# Patient Record
Sex: Female | Born: 1935
Health system: Southern US, Community
[De-identification: ages and names within clinical notes are randomized; demographics above are authoritative.]

## PROBLEM LIST (undated history)

## (undated) DIAGNOSIS — IMO0001 Reserved for inherently not codable concepts without codable children: Secondary | ICD-10-CM

## (undated) DIAGNOSIS — R208 Other disturbances of skin sensation: Secondary | ICD-10-CM

## (undated) DIAGNOSIS — I080 Rheumatic disorders of both mitral and aortic valves: Secondary | ICD-10-CM

## (undated) DIAGNOSIS — J349 Unspecified disorder of nose and nasal sinuses: Secondary | ICD-10-CM

## (undated) DIAGNOSIS — C50919 Malignant neoplasm of unspecified site of unspecified female breast: Secondary | ICD-10-CM

## (undated) DIAGNOSIS — E7800 Pure hypercholesterolemia, unspecified: Secondary | ICD-10-CM

## (undated) DIAGNOSIS — R269 Unspecified abnormalities of gait and mobility: Secondary | ICD-10-CM

## (undated) DIAGNOSIS — R0602 Shortness of breath: Secondary | ICD-10-CM

## (undated) DIAGNOSIS — F419 Anxiety disorder, unspecified: Secondary | ICD-10-CM

## (undated) DIAGNOSIS — Z9289 Personal history of other medical treatment: Secondary | ICD-10-CM

## (undated) DIAGNOSIS — E785 Hyperlipidemia, unspecified: Secondary | ICD-10-CM

## (undated) DIAGNOSIS — R011 Cardiac murmur, unspecified: Secondary | ICD-10-CM

## (undated) DIAGNOSIS — G479 Sleep disorder, unspecified: Secondary | ICD-10-CM

## (undated) DIAGNOSIS — K5790 Diverticulosis of intestine, part unspecified, without perforation or abscess without bleeding: Secondary | ICD-10-CM

## (undated) DIAGNOSIS — M8580 Other specified disorders of bone density and structure, unspecified site: Secondary | ICD-10-CM

## (undated) DIAGNOSIS — H9190 Unspecified hearing loss, unspecified ear: Secondary | ICD-10-CM

## (undated) DIAGNOSIS — I10 Essential (primary) hypertension: Secondary | ICD-10-CM

## (undated) DIAGNOSIS — M1990 Unspecified osteoarthritis, unspecified site: Secondary | ICD-10-CM

## (undated) DIAGNOSIS — G4700 Insomnia, unspecified: Secondary | ICD-10-CM

## (undated) DIAGNOSIS — I890 Lymphedema, not elsewhere classified: Secondary | ICD-10-CM

## (undated) DIAGNOSIS — M419 Scoliosis, unspecified: Secondary | ICD-10-CM

## (undated) DIAGNOSIS — R4189 Other symptoms and signs involving cognitive functions and awareness: Secondary | ICD-10-CM

## (undated) DIAGNOSIS — K219 Gastro-esophageal reflux disease without esophagitis: Secondary | ICD-10-CM

## (undated) DIAGNOSIS — R609 Edema, unspecified: Secondary | ICD-10-CM

## (undated) DIAGNOSIS — N189 Chronic kidney disease, unspecified: Secondary | ICD-10-CM

## (undated) DIAGNOSIS — Z8619 Personal history of other infectious and parasitic diseases: Secondary | ICD-10-CM

## (undated) HISTORY — DX: Hyperlipidemia, unspecified: E78.5

## (undated) HISTORY — DX: Personal history of other medical treatment: Z92.89

## (undated) HISTORY — DX: Other symptoms and signs involving cognitive functions and awareness: R41.89

## (undated) HISTORY — DX: Gastro-esophageal reflux disease without esophagitis: K21.9

## (undated) HISTORY — DX: Edema, unspecified: R60.9

## (undated) HISTORY — DX: Cardiac murmur, unspecified: R01.1

## (undated) HISTORY — DX: Shortness of breath: R06.02

## (undated) HISTORY — DX: Malignant neoplasm of unspecified site of unspecified female breast: C50.919

## (undated) HISTORY — DX: Other specified disorders of bone density and structure, unspecified site: M85.80

## (undated) HISTORY — DX: Unspecified osteoarthritis, unspecified site: M19.90

## (undated) HISTORY — DX: Pure hypercholesterolemia, unspecified: E78.00

## (undated) HISTORY — DX: Essential (primary) hypertension: I10

## (undated) HISTORY — PX: EYE SURGERY: SHX253

## (undated) HISTORY — DX: Anxiety disorder, unspecified: F41.9

## (undated) HISTORY — DX: Insomnia, unspecified: G47.00

## (undated) HISTORY — DX: Rheumatic disorders of both mitral and aortic valves: I08.0

## (undated) HISTORY — DX: Chronic kidney disease, unspecified: N18.9

## (undated) HISTORY — DX: Unspecified abnormalities of gait and mobility: R26.9

## (undated) HISTORY — DX: Other disturbances of skin sensation: R20.8

## (undated) HISTORY — DX: Scoliosis, unspecified: M41.9

---
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
## 1971-03-03 HISTORY — PX: ABDOMINAL HYSTERECTOMY: SHX81

## 1994-05-01 HISTORY — PX: TRANSTHORACIC ECHOCARDIOGRAM: SHX275

## 1995-03-03 DIAGNOSIS — C50919 Malignant neoplasm of unspecified site of unspecified female breast: Secondary | ICD-10-CM

## 1995-03-03 HISTORY — DX: Malignant neoplasm of unspecified site of unspecified female breast: C50.919

## 1995-03-03 HISTORY — PX: BREAST LUMPECTOMY: SHX2

## 1996-03-02 DIAGNOSIS — Z8619 Personal history of other infectious and parasitic diseases: Secondary | ICD-10-CM

## 1996-03-02 HISTORY — DX: Personal history of other infectious and parasitic diseases: Z86.19

## 1997-04-10 ENCOUNTER — Ambulatory Visit (HOSPITAL_COMMUNITY): Admission: RE | Admit: 1997-04-10 | Discharge: 1997-04-10 | Payer: Self-pay | Admitting: Cardiology

## 1997-10-02 ENCOUNTER — Ambulatory Visit (HOSPITAL_COMMUNITY): Admission: RE | Admit: 1997-10-02 | Discharge: 1997-10-02 | Payer: Self-pay | Admitting: *Deleted

## 1998-09-16 ENCOUNTER — Other Ambulatory Visit: Admission: RE | Admit: 1998-09-16 | Discharge: 1998-09-16 | Payer: Self-pay | Admitting: Family Medicine

## 1998-10-04 ENCOUNTER — Ambulatory Visit (HOSPITAL_COMMUNITY): Admission: RE | Admit: 1998-10-04 | Discharge: 1998-10-04 | Payer: Self-pay | Admitting: *Deleted

## 1998-11-28 ENCOUNTER — Ambulatory Visit (HOSPITAL_COMMUNITY): Admission: RE | Admit: 1998-11-28 | Discharge: 1998-11-28 | Payer: Self-pay | Admitting: *Deleted

## 1999-10-10 ENCOUNTER — Encounter: Payer: Self-pay | Admitting: Specialist

## 1999-10-14 ENCOUNTER — Encounter: Payer: Self-pay | Admitting: Oncology

## 1999-10-14 ENCOUNTER — Encounter: Admission: RE | Admit: 1999-10-14 | Discharge: 1999-10-14 | Payer: Self-pay

## 1999-10-16 ENCOUNTER — Ambulatory Visit (HOSPITAL_COMMUNITY): Admission: RE | Admit: 1999-10-16 | Discharge: 1999-10-16 | Payer: Self-pay | Admitting: Specialist

## 2000-02-04 ENCOUNTER — Other Ambulatory Visit: Admission: RE | Admit: 2000-02-04 | Discharge: 2000-02-04 | Payer: Self-pay | Admitting: Family Medicine

## 2000-03-02 HISTORY — PX: REPLACEMENT TOTAL KNEE: SUR1224

## 2000-10-14 ENCOUNTER — Encounter: Admission: RE | Admit: 2000-10-14 | Discharge: 2000-10-14 | Payer: Self-pay | Admitting: Oncology

## 2000-10-14 ENCOUNTER — Encounter: Payer: Self-pay | Admitting: Oncology

## 2001-10-18 ENCOUNTER — Encounter: Payer: Self-pay | Admitting: Oncology

## 2001-10-18 ENCOUNTER — Encounter: Admission: RE | Admit: 2001-10-18 | Discharge: 2001-10-18 | Payer: Self-pay | Admitting: Oncology

## 2002-03-24 ENCOUNTER — Inpatient Hospital Stay (HOSPITAL_COMMUNITY): Admission: EM | Admit: 2002-03-24 | Discharge: 2002-03-27 | Payer: Self-pay | Admitting: Family Medicine

## 2002-03-24 ENCOUNTER — Encounter: Payer: Self-pay | Admitting: Internal Medicine

## 2002-03-25 ENCOUNTER — Encounter: Payer: Self-pay | Admitting: Internal Medicine

## 2002-10-23 ENCOUNTER — Encounter: Admission: RE | Admit: 2002-10-23 | Discharge: 2002-10-23 | Payer: Self-pay | Admitting: Family Medicine

## 2002-10-23 ENCOUNTER — Encounter: Payer: Self-pay | Admitting: Family Medicine

## 2003-11-02 ENCOUNTER — Encounter: Admission: RE | Admit: 2003-11-02 | Discharge: 2003-11-02 | Payer: Self-pay | Admitting: Family Medicine

## 2005-01-13 ENCOUNTER — Encounter: Admission: RE | Admit: 2005-01-13 | Discharge: 2005-01-13 | Payer: Self-pay | Admitting: Family Medicine

## 2006-03-15 ENCOUNTER — Encounter: Admission: RE | Admit: 2006-03-15 | Discharge: 2006-03-15 | Payer: Self-pay | Admitting: Family Medicine

## 2007-04-04 ENCOUNTER — Encounter: Admission: RE | Admit: 2007-04-04 | Discharge: 2007-04-04 | Payer: Self-pay | Admitting: Family Medicine

## 2008-04-10 ENCOUNTER — Encounter: Admission: RE | Admit: 2008-04-10 | Discharge: 2008-04-10 | Payer: Self-pay | Admitting: Family Medicine

## 2009-03-27 ENCOUNTER — Emergency Department (HOSPITAL_COMMUNITY): Admission: EM | Admit: 2009-03-27 | Discharge: 2009-03-27 | Payer: Self-pay | Admitting: Emergency Medicine

## 2009-05-22 ENCOUNTER — Encounter: Admission: RE | Admit: 2009-05-22 | Discharge: 2009-05-22 | Payer: Self-pay | Admitting: Family Medicine

## 2009-10-17 ENCOUNTER — Emergency Department (HOSPITAL_COMMUNITY): Admission: EM | Admit: 2009-10-17 | Discharge: 2009-10-17 | Payer: Self-pay | Admitting: Emergency Medicine

## 2009-11-28 ENCOUNTER — Encounter: Admission: RE | Admit: 2009-11-28 | Discharge: 2009-11-28 | Payer: Self-pay | Admitting: Orthopedic Surgery

## 2009-11-30 DIAGNOSIS — Z9289 Personal history of other medical treatment: Secondary | ICD-10-CM

## 2009-11-30 HISTORY — DX: Personal history of other medical treatment: Z92.89

## 2009-12-31 HISTORY — PX: ROTATOR CUFF REPAIR: SHX139

## 2010-01-24 ENCOUNTER — Ambulatory Visit (HOSPITAL_COMMUNITY)
Admission: RE | Admit: 2010-01-24 | Discharge: 2010-01-25 | Payer: Self-pay | Source: Home / Self Care | Admitting: Orthopedic Surgery

## 2010-03-23 ENCOUNTER — Encounter: Payer: Self-pay | Admitting: Family Medicine

## 2010-05-13 LAB — BASIC METABOLIC PANEL
CO2: 31 mEq/L (ref 19–32)
GFR calc non Af Amer: 53 mL/min — ABNORMAL LOW (ref >60)
Glucose, Bld: 105 mg/dL — ABNORMAL HIGH (ref 70–99)
Potassium: 4.5 mEq/L (ref 3.5–5.1)
Sodium: 145 mEq/L (ref 135–145)

## 2010-05-13 LAB — CBC
MCH: 31.3 pg (ref 26.0–34.0)
MCHC: 34.8 g/dL (ref 30.0–36.0)
RDW: 13.1 % (ref 11.5–15.5)

## 2010-05-13 LAB — SURGICAL PCR SCREEN: MRSA, PCR: NEGATIVE

## 2010-05-19 LAB — URINALYSIS, ROUTINE W REFLEX MICROSCOPIC
Glucose, UA: NEGATIVE mg/dL
Nitrite: NEGATIVE
Specific Gravity, Urine: 1.013 (ref 1.005–1.030)
pH: 6 (ref 5.0–8.0)

## 2010-05-19 LAB — CULTURE, BLOOD (ROUTINE X 2): Culture: NO GROWTH

## 2010-05-19 LAB — CBC
MCV: 88.8 fL (ref 78.0–100.0)
Platelets: 150 10*3/uL (ref 150–400)
RDW: 13.1 % (ref 11.5–15.5)
WBC: 3.4 10*3/uL — ABNORMAL LOW (ref 4.0–10.5)

## 2010-05-19 LAB — COMPREHENSIVE METABOLIC PANEL
ALT: 20 U/L (ref 0–35)
Alkaline Phosphatase: 42 U/L (ref 39–117)
CO2: 28 mEq/L (ref 19–32)
Chloride: 93 mEq/L — ABNORMAL LOW (ref 96–112)
GFR calc Af Amer: >60 mL/min (ref >60)
GFR calc non Af Amer: >60 mL/min (ref >60)
Glucose, Bld: 113 mg/dL — ABNORMAL HIGH (ref 70–99)
Potassium: 3.4 mEq/L — ABNORMAL LOW (ref 3.5–5.1)
Sodium: 130 mEq/L — ABNORMAL LOW (ref 135–145)

## 2010-05-19 LAB — DIFFERENTIAL
Basophils Absolute: 0 10*3/uL (ref 0.0–0.1)
Eosinophils Absolute: 0.1 10*3/uL (ref 0.0–0.7)
Eosinophils Relative: 3 % (ref 0–5)
Monocytes Absolute: 0.4 10*3/uL (ref 0.1–1.0)

## 2010-05-19 LAB — CK TOTAL AND CKMB (NOT AT ARMC): CK, MB: 0.6 ng/mL (ref 0.3–4.0)

## 2010-05-19 LAB — SEDIMENTATION RATE: Sed Rate: 30 mm/hr — ABNORMAL HIGH (ref 0–22)

## 2010-07-18 NOTE — Discharge Summary (Signed)
[name], [name] NO.:  [id number]   MEDICAL RECORD NO.:  [id number]                   PATIENT TYPE:  INP   LOCATION:  0380                                 FACILITY:  [facility]   PHYSICIAN:  Lazaro Arms, M.D.        DATE OF BIRTH:  10-07-1926   DATE OF ADMISSION:  03/24/2002  DATE OF DISCHARGE:  03/27/2002                                 DISCHARGE SUMMARY   DISCHARGE DIAGNOSES:  1. Anorexia and weight loss of unclear etiology.  2. Gastroesophageal reflux disease.  3. Hypertension.  4. Chronic anxiety.  5. Hyperlipidemia.  6. Status post breast cancer.  7. Allergic rhinitis.   PROCEDURE:  Upper endoscopy by Dr. Bernette Redbird on March 26, 2002, which  was a completely normal examination. Normal upper endoscopy and normal  duodenoscopy.   DIAGNOSTIC IMPRESSION:  An ultrasound of the abdomen showed a normal  gallbladder without any gallstones and a slightly dilated common bile duct  at 6.7 mm.  A CT of the abdomen raised the question of common bile duct that  was approximately 11 mm and some diverticulosis. A chest x-ray was within  normal limits.   HISTORY OF PRESENT ILLNESS:  Ms. [name] Morton was directly admitted from the  office because of anorexia and weight loss and fatigue, which had been  progressive over the past several weeks. Prior to this, she had had some  orthodontic work, as well as a sinus infection and had taken some  antibiotics for that. Workup as an outpatient was unrevealing. She was  admitted to the hospital and her initial labs were significant for serum  sodium of 131, potassium of 3.1. The chief complaint was just a lack of  appetite that she could not explain and she was quite worried about this.  She was admitted to the hospital and placed on IV fluids. GI was consulted.  She was seen by Dr. Bernette Redbird on March 26, 2002. See his consultation  note for full details. The patient was placed on dietary  supplements and she  did quite well. Of note, her blood count did drop slightly with hydration.   DISPOSITION:  The patient was discharged to home on March 26, 2002.   DISCHARGE LABORATORY DATA:  Included hematocrit of 30.4 and serum sodium of  133.   PLAN:  1. She will follow-up with Shaune Pollack, M.D. each week for weight checks,     follow-up CBC's and basic metabolic profile.  2.     She will follow-up with Dr. Matthias Hughs as needed if she has worsening abdominal     pain.  3. She is planning on removing her braces, as the dysacusia associated with     the braces, might be contributing to her anorexia. She has an appointment     with her orthodontist for such.  Lazaro Arms, M.D.    AMC/MEDQ  D:  03/27/2002  T:  03/27/2002  Job:  [id number]   cc:   Duncan Dull, M.D.  [address]  [place]  [place] [address]  Fax: [phone]

## 2010-07-18 NOTE — Consult Note (Signed)
NAME:  [name] Morton, [name] Morton NO.:  [id number]   MEDICAL RECORD NO.:  [id number]                   PATIENT TYPE:  INP   LOCATION:  0380                                 FACILITY:  [facility]   PHYSICIAN:  Bernette Redbird, M.D.                DATE OF BIRTH:  [birth year]   DATE OF CONSULTATION:  03/25/2002  DATE OF DISCHARGE:                                   CONSULTATION   Dr. Maryruth Hancock Campolattaro of the [facility] Service asked me to see  this 77 year old female because of an enlarged bile duct on CT scanning.   HISTORY OF PRESENT ILLNESS:  The patient was admitted as a direct admit  yesterday from [facility] because of more-or-less a  failure to thrive situation.  The story seems to go back several weeks,  after doing some heavy yard work, and the next day she found herself so  exhausted that she basically could not get out of bed.  Since then, and  especially over the past several weeks since she had some major orthodontic  work done, the patient has had a diminished appetite.  This seems to be  mixed in with the fact that the patient has pain every time she tries to  take a bite of food, which has led to some degree of food avoidance  behavior.  Dr. Jonell Cluck obtained a history of abdominal pain, but  despite fairly direct questioning, that does not seem to be a problem for  the patient from the history I obtained.  It certainly is not a prominent  component or feature of her symptoms.   The patient believes she has lost about 10 pounds in association with this  condition.  She has had an empty feeling in her stomach and this has been  associated with some de anorexia.  Again, frank abdominal pain is not a  symptom that I obtained a history of.   The patient was admitted yesterday for further evaluation.  Admission labs  were unremarkable other than some mile hyponatremia with a serum sodium of  130.  Notably, liver chemistries  were entirely within normal limits and  amylase and lipase were also normal.  TSH level was normal.  Fecal occult  blood was negative.   The patient had a CT scan of the abdomen which I have reviewed at length  with the radiologist.  The only abnormality there is a somewhat dilated  common bile duct, all the way down to the level of the ampulla, reaching a  diameter of about 1 cm, without any dilatation of the pancreatic duct and  without any evidence of a pancreatic mass.   Notably, there is a family history of gallstones in the patient's mother.   PAST MEDICAL HISTORY:   ALLERGIES:  Allergies or intolerance to various medications including  BENADRYL (makes her break out), HUMIBID, and CEFTIN.   OUTPATIENT MEDICATIONS:  1.  Prilosec.  2. Furosemide.  3. Evista.  4. Allegra.  5. Lotensin.  6. Hydrochlorothiazide.  7. Blood pressure pill with bisoprolol and hydrochlorothiazide.  8. Zocor.  9. Daily aspirin.   OPERATIONS:  1. Lumpectomy with radiation therapy for breast cancer in 1997.  2. Remote appendectomy.  3. Hysterectomy.   CHRONIC MEDICAL ILLNESSES:  1. Hypertension.  2. History of mitral valve prolapse.  3. No COPD.  4. No diabetes.  5. No known coronary disease.   HABITS:  Lifelong nonsmoker, nondrinker.   FAMILY HISTORY:  Positive for gallstones in her mother, but negative for  colon cancer or colitis.  There is also a family history of ulcer disease in  her father.   SOCIAL HISTORY:  The patient is a retired Engineer, civil (consulting) who is married for the  second time after being widowed for about 15 years.   REVIEW OF SYSTEMS:  Negative for heartburn, dysphagia, significant abdominal  pain (by my history), constipation, diarrhea, or rectal bleeding.  The  patient typically moves her bowels every 1-2 days (although she has had some  diarrhea in the hospital since drinking contrast for the CT scan).   PHYSICAL EXAMINATION:  GENERAL:  The patient is a very healthy-appearing   female in absolutely no distress and appearing neither anxious nor  depressed.  She has makeup on, her hair is done, she is slightly overweight,  and looks very healthy overall.  She does not have in any way a cachectic or  chronically ill appearance.  VITAL SIGNS:  Blood pressure 144/62, pulse 76, afebrile, respirations 19.  SKIN:  She is without evident pallor.  CHEST:  Clear to auscultation.  HEART:  There is a 1/6 systolic ejection murmur.  ABDOMEN:  The abdomen is adipose without organomegaly, guarding, masses, or  tenderness.  The abdomen is very soft and again totally benign.  RECTAL:  Rectal exam was performed and showed an empty ampulla with no  masses.  Previously, a stool specimen had already been sent, which was  Hemoccult negative, so I did not re-guaiac the mucoid specimen obtained on  the exam glove.   LABORATORY DATA:  White count 9000, hemoglobin 13.3, MCV 88, platelets  369,000.  Sodium 130, potassium 3.5, chloride 93, CO2 28, glucose 126  (nonfasting, with 87 on repeat as a fasting specimen).  BUN 10, creatinine  1.0, bilirubin 0.8, alkaline phosphatase 42, SGOT 15, SGPT 14.  Albumin 4.1,  calcium 9.6.  TSH 1.6, amylase 67, lipase 21.   CT scan:  See above.   IMPRESSION:  1. Dilation of the common bile duct by CT scanning, in the absence of any     biliary tract symptoms or LFT abnormalities.  This may be some form of     physiologic dilatation of the common duct, or it could result from stones     in the common duct, ampullary stenosis, and ampullary tumor,     cholangiocarcinoma, or cancer of the head of the pancreas.  It would be     somewhat unusual for something causing chronic obstruction, such as a     tumor, to cath entirely normal liver chemistries.  2. Anorexia/food avoidance/weight loss/empty feeling in the stomach,     possibly secondary to recent orthodontic work, conceivably a medication    reaction, or possibly due to some sort of underlying disease,  which seems     unlikely.   RECOMMENDATIONS:  1. Await results of the ultrasound.  2. Check CA 19/9 level.  3. Upper endoscopy tomorrow morning (I have discussed the nature, purpose,     and risks of the test with the patient and she is agreeable to proceed).  4. Consider MRCP for further evaluation of the common duct.  At this time, I     do not think that there is sufficient clinical suspicion to warrant doing     ERCP and exposing the patient to the risks associated with that     procedure.  5. The patient's colon cancer screening is probably up to date.  She     describes some sort of a procedure, possibly a colonoscopy of flexible     sigmoidoscopy, several years ago, at which polyps were removed,     although on e chart I see no evidence of a pathology specimen on this     patient having been obtained within the past couple of years.  The     screening for colon cancer could be at the discretion of the patient's     primary physician, who is more acquainted with whatever testing has     already been performed.                                                 Bernette Redbird, M.D.    RB/MEDQ  D:  03/25/2002  T:  03/25/2002  Job:  [id number]   cc:   Duncan Dull, M.D.  [address]  [place]  [place] [address]  Fax: [phone]   Thereasa Solo. Little, M.D.  [address]  [place] [address]  Fax: [phone]

## 2010-07-18 NOTE — Op Note (Signed)
[facility]  Patient:    [name] Morton, [name] Morton                       MRN: [id number] Proc. Date: 10/16/99 Adm. Date:  [id number] Disc. Date: [id number] Attending:  Montez Morita, Philips John                           Operative Report  PREOPERATIVE DIAGNOSIS:  Torn medial meniscus of the left knee.  POSTOPERATIVE DIAGNOSIS:  Torn medial meniscus of the left knee.  OPERATION PERFORMED:  Partial medial meniscectomy.  SURGEON:  Philips J. Montez Morita, M.D.  DESCRIPTION OF PROCEDURE:  After suitable general anesthesia, the leg was exsanguinated and an upper thigh tourniquet was inflated to 350 mmHg and placed into a leg holder.  She was then prepped and draped routinely. Arthroscopy was carried out with the usual portals in the usual fashion with the anteromedial portal being done under direct vision.  All pertinent pathology was confined to the medial joint where there a degenerative tear of the posterior horn.  This was trimmed out with a shut slightly angled up bite and a straight Accuflex bite punch forceps and a 3.5 and a 4.2 rotatory meniscotome.  At the end, a nice smooth posterior rim remained.  Marcaine 20 cc 0.50% in the knee at the end, a little Marcaine in each of the stab wounds. Ice compression dressing.  The tourniquet was up around 30 minutes.  Taken to the recovery room in good condition. DD:  10/16/99 TD:  10/18/99 Job: [id number]

## 2010-07-18 NOTE — Op Note (Signed)
[name] Morton, [name] Morton NO.:  [id number]   MEDICAL RECORD NO.:  [id number]                   PATIENT TYPE:  INP   LOCATION:  0380                                 FACILITY:  [facility]   PHYSICIAN:  Bernette Redbird, M.D.                DATE OF BIRTH:  04/14/1926   DATE OF PROCEDURE:  03/26/2002  DATE OF DISCHARGE:                                 OPERATIVE REPORT   PROCEDURE:  Upper endoscopy.   INDICATION:  This is a 77 year old female, status post recent orthodontic  work which has led to considerable oral pain.  Associated with that has also  been loss of appetite and some questionable nonspecific abdominal pain.  An  abdominal CT a couple of days ago was negative except for dilatation of the  common bile duct to a diameter of about 1 cm.  Fortunately, her ultrasound  yesterday showed less impressive dilatation, just 6.7 mm.  Her liver  chemistries are normal.  Nevertheless, there has been loss of appetite and  loss of weight, so endoscopic evaluation was felt to be warranted.   FINDINGS:  Normal exam.  Duodenoscopy shows normal-appearing papillary  region.   DESCRIPTION OF PROCEDURE:  The nature, purpose, and risks of the procedure  had been discussed with the patient who provided written consent.  Sedation  prior to and during the course of the exam totaled fentanyl 75 mcg and  Versed 8 mg IV without arrhythmias or desaturation.   The Olympus video endoscope was passed easily under direct vision.  The  vocal cords looked grossly normal.  The esophagus was readily entered and  had a normal mucosal appearance without evidence of reflux esophagitis,  Barrett's esophagus, varices, infection, or neoplasia.  No ring, stricture,  or hiatal hernia was appreciated.   The stomach contained no residual and had entirely normal mucosa without  evidence of any masses, polyps, gastritis, erosions, ulcers, or masses,  including a careful look at the cardia  region.   The pylorus, duodenal bulb, and second and proximal third portions of the  duodenum looked normal.   These areas were rechecked, including the stomach and duodenum, and the  scope was then removed from the patient.   I then passed a duodenoscope blindly into the esophagus to view the  papillary region.  There was some resistance, so we had to medicate the  patient further to get her to relax more at which time the scope was able to  be passed.  It was advanced into the second duodenum where I identified what  I believe was the major papilla with the typical hood.  It had a  completely normal appearance.  In fact, it was a little bit on the small  side.  There was certainly no evidence of any ampullary tumor at this  region.  I never visualized another papilla (appreciated only the minor  papilla), but I went up and  down the duodenal area a couple of times and  could not see any mass projecting into the lumen.   The scope was then removed from the patient.  She tolerated the procedure  well.  There there were no apparent complications.  No biopsies were  obtained during this procedure or the first part of the exam with the  standard gastroscope.   IMPRESSION:  Entirely normal upper endoscopy without source of anorexia  endoscopically evident.   PLAN:  Follow up clinically.  The patient's husband indicates they plan to  have the orthodontic appliances removed in the near future and if this does  not bring about resolution of her food intolerance and food avoidance  behavior, further evaluation may be warranted.                                               Bernette Redbird, M.D.    RB/MEDQ  D:  03/26/2002  T:  03/26/2002  Job:  [id number]   cc:   Duncan Dull, M.D.  [address]  [place]  [place] [address]  Fax: [phone]

## 2011-04-20 DIAGNOSIS — H1030 Unspecified acute conjunctivitis, unspecified eye: Secondary | ICD-10-CM | POA: Diagnosis not present

## 2011-04-22 DIAGNOSIS — M949 Disorder of cartilage, unspecified: Secondary | ICD-10-CM | POA: Diagnosis not present

## 2011-04-22 DIAGNOSIS — G4700 Insomnia, unspecified: Secondary | ICD-10-CM | POA: Diagnosis not present

## 2011-04-22 DIAGNOSIS — J329 Chronic sinusitis, unspecified: Secondary | ICD-10-CM | POA: Diagnosis not present

## 2011-06-03 DIAGNOSIS — M949 Disorder of cartilage, unspecified: Secondary | ICD-10-CM | POA: Diagnosis not present

## 2011-06-03 DIAGNOSIS — M899 Disorder of bone, unspecified: Secondary | ICD-10-CM | POA: Diagnosis not present

## 2011-06-09 DIAGNOSIS — H25019 Cortical age-related cataract, unspecified eye: Secondary | ICD-10-CM | POA: Diagnosis not present

## 2011-07-22 DIAGNOSIS — H5200 Hypermetropia, unspecified eye: Secondary | ICD-10-CM | POA: Diagnosis not present

## 2011-07-22 DIAGNOSIS — H52209 Unspecified astigmatism, unspecified eye: Secondary | ICD-10-CM | POA: Diagnosis not present

## 2011-07-22 DIAGNOSIS — H259 Unspecified age-related cataract: Secondary | ICD-10-CM | POA: Diagnosis not present

## 2011-07-22 DIAGNOSIS — H43819 Vitreous degeneration, unspecified eye: Secondary | ICD-10-CM | POA: Diagnosis not present

## 2011-07-28 DIAGNOSIS — H5700 Unspecified anomaly of pupillary function: Secondary | ICD-10-CM | POA: Diagnosis not present

## 2011-07-28 DIAGNOSIS — H25019 Cortical age-related cataract, unspecified eye: Secondary | ICD-10-CM | POA: Diagnosis not present

## 2011-07-28 DIAGNOSIS — H2510 Age-related nuclear cataract, unspecified eye: Secondary | ICD-10-CM | POA: Diagnosis not present

## 2011-07-28 DIAGNOSIS — H269 Unspecified cataract: Secondary | ICD-10-CM | POA: Diagnosis not present

## 2011-07-28 DIAGNOSIS — H21569 Pupillary abnormality, unspecified eye: Secondary | ICD-10-CM | POA: Diagnosis not present

## 2011-07-28 DIAGNOSIS — H25049 Posterior subcapsular polar age-related cataract, unspecified eye: Secondary | ICD-10-CM | POA: Diagnosis not present

## 2011-07-28 DIAGNOSIS — H25039 Anterior subcapsular polar age-related cataract, unspecified eye: Secondary | ICD-10-CM | POA: Diagnosis not present

## 2011-08-11 DIAGNOSIS — H269 Unspecified cataract: Secondary | ICD-10-CM | POA: Diagnosis not present

## 2011-08-11 DIAGNOSIS — H25019 Cortical age-related cataract, unspecified eye: Secondary | ICD-10-CM | POA: Diagnosis not present

## 2011-08-11 DIAGNOSIS — H25049 Posterior subcapsular polar age-related cataract, unspecified eye: Secondary | ICD-10-CM | POA: Diagnosis not present

## 2011-08-11 DIAGNOSIS — H2510 Age-related nuclear cataract, unspecified eye: Secondary | ICD-10-CM | POA: Diagnosis not present

## 2011-09-15 DIAGNOSIS — E782 Mixed hyperlipidemia: Secondary | ICD-10-CM | POA: Diagnosis not present

## 2011-09-15 DIAGNOSIS — I10 Essential (primary) hypertension: Secondary | ICD-10-CM | POA: Diagnosis not present

## 2011-09-16 DIAGNOSIS — E782 Mixed hyperlipidemia: Secondary | ICD-10-CM | POA: Diagnosis not present

## 2011-09-16 DIAGNOSIS — Z79899 Other long term (current) drug therapy: Secondary | ICD-10-CM | POA: Diagnosis not present

## 2011-09-16 DIAGNOSIS — R5381 Other malaise: Secondary | ICD-10-CM | POA: Diagnosis not present

## 2011-10-06 DIAGNOSIS — H43819 Vitreous degeneration, unspecified eye: Secondary | ICD-10-CM | POA: Diagnosis not present

## 2011-12-28 DIAGNOSIS — Z23 Encounter for immunization: Secondary | ICD-10-CM | POA: Diagnosis not present

## 2012-02-22 DIAGNOSIS — H6980 Other specified disorders of Eustachian tube, unspecified ear: Secondary | ICD-10-CM | POA: Diagnosis not present

## 2012-03-02 HISTORY — PX: COLONOSCOPY: SHX174

## 2012-03-24 DIAGNOSIS — H5310 Unspecified subjective visual disturbances: Secondary | ICD-10-CM | POA: Diagnosis not present

## 2012-03-24 DIAGNOSIS — H43819 Vitreous degeneration, unspecified eye: Secondary | ICD-10-CM | POA: Diagnosis not present

## 2012-03-24 DIAGNOSIS — Z961 Presence of intraocular lens: Secondary | ICD-10-CM | POA: Diagnosis not present

## 2012-04-05 DIAGNOSIS — I10 Essential (primary) hypertension: Secondary | ICD-10-CM | POA: Diagnosis not present

## 2012-04-05 DIAGNOSIS — E782 Mixed hyperlipidemia: Secondary | ICD-10-CM | POA: Diagnosis not present

## 2012-04-07 DIAGNOSIS — Z79899 Other long term (current) drug therapy: Secondary | ICD-10-CM | POA: Diagnosis not present

## 2012-04-07 DIAGNOSIS — E782 Mixed hyperlipidemia: Secondary | ICD-10-CM | POA: Diagnosis not present

## 2012-05-02 ENCOUNTER — Other Ambulatory Visit: Payer: Self-pay | Admitting: Cardiology

## 2012-05-05 LAB — BASIC METABOLIC PANEL
BUN: 32 — AB (ref 4–21)
Creatinine: 1.5 — AB (ref 0.5–1.1)
Glucose: 144
Potassium: 4.5 (ref 3.4–5.3)
Sodium: 141 (ref 137–147)

## 2012-05-05 LAB — HEPATIC FUNCTION PANEL
ALT: 14 (ref 7–35)
AST: 15 (ref 13–35)
Alkaline Phosphatase: 51 (ref 25–125)
Bilirubin, Total: 0.4

## 2012-05-05 LAB — TSH: TSH: 2.18 (ref 0.41–5.90)

## 2012-05-20 LAB — BASIC METABOLIC PANEL
BUN: 21 (ref 4–21)
Creatinine: 1.1 (ref 0.5–1.1)
GLUCOSE: 115
Potassium: 4.1 (ref 3.4–5.3)
Sodium: 143 (ref 137–147)

## 2012-05-20 LAB — LIPID PANEL
Cholesterol: 191 (ref 0–200)
HDL: 71 — AB (ref 35–70)
LDL CALC: 92
Triglycerides: 144 (ref 40–160)

## 2012-06-07 ENCOUNTER — Ambulatory Visit
Admission: RE | Admit: 2012-06-07 | Discharge: 2012-06-07 | Disposition: A | Discharge status: Home / Self Care | Payer: TRICARE For Life (TFL) | Source: Ambulatory Visit | Attending: Cardiology | Admitting: Cardiology

## 2012-06-07 DIAGNOSIS — Z1231 Encounter for screening mammogram for malignant neoplasm of breast: Secondary | ICD-10-CM | POA: Diagnosis not present

## 2012-08-10 ENCOUNTER — Other Ambulatory Visit: Payer: Self-pay | Admitting: *Deleted

## 2012-08-10 MED ORDER — AMLODIPINE BESY-BENAZEPRIL HCL 10-20 MG PO CAPS: 1.0000 | ORAL_CAPSULE | Freq: Every day | ORAL | Status: DC

## 2012-08-10 NOTE — Telephone Encounter (Signed)
[facility]rel [facility]

## 2012-08-16 ENCOUNTER — Other Ambulatory Visit: Payer: Self-pay | Admitting: *Deleted

## 2012-08-16 MED ORDER — SIMVASTATIN 20 MG PO TABS: 20.0000 mg | ORAL_TABLET | Freq: Every day | ORAL | Status: DC

## 2012-09-23 DIAGNOSIS — M1710 Unilateral primary osteoarthritis, unspecified knee: Secondary | ICD-10-CM | POA: Diagnosis not present

## 2012-11-15 DIAGNOSIS — M1710 Unilateral primary osteoarthritis, unspecified knee: Secondary | ICD-10-CM | POA: Diagnosis not present

## 2012-11-23 DIAGNOSIS — M1710 Unilateral primary osteoarthritis, unspecified knee: Secondary | ICD-10-CM | POA: Diagnosis not present

## 2012-12-01 DIAGNOSIS — M1710 Unilateral primary osteoarthritis, unspecified knee: Secondary | ICD-10-CM | POA: Diagnosis not present

## 2012-12-09 DIAGNOSIS — IMO0002 Reserved for concepts with insufficient information to code with codable children: Secondary | ICD-10-CM | POA: Diagnosis not present

## 2012-12-09 DIAGNOSIS — M1710 Unilateral primary osteoarthritis, unspecified knee: Secondary | ICD-10-CM | POA: Diagnosis not present

## 2012-12-11 DIAGNOSIS — K625 Hemorrhage of anus and rectum: Secondary | ICD-10-CM | POA: Diagnosis not present

## 2012-12-14 DIAGNOSIS — K625 Hemorrhage of anus and rectum: Secondary | ICD-10-CM | POA: Diagnosis not present

## 2012-12-14 LAB — HM COLONOSCOPY

## 2012-12-16 ENCOUNTER — Other Ambulatory Visit: Payer: Self-pay | Admitting: Cardiology

## 2012-12-16 NOTE — Telephone Encounter (Signed)
Rx was sent to pharmacy electronically. 

## 2012-12-23 ENCOUNTER — Encounter: Payer: Self-pay | Admitting: *Deleted

## 2012-12-26 DIAGNOSIS — Z23 Encounter for immunization: Secondary | ICD-10-CM | POA: Diagnosis not present

## 2012-12-27 ENCOUNTER — Telehealth: Payer: Self-pay | Admitting: Cardiology

## 2012-12-27 ENCOUNTER — Ambulatory Visit (INDEPENDENT_AMBULATORY_CARE_PROVIDER_SITE_OTHER): Payer: Medicare Other | Admitting: Cardiology

## 2012-12-27 ENCOUNTER — Encounter: Payer: Self-pay | Admitting: Cardiology

## 2012-12-27 VITALS — BP 120/62 | Ht 60.0 in | Wt 148.1 lb

## 2012-12-27 DIAGNOSIS — R600 Localized edema: Secondary | ICD-10-CM

## 2012-12-27 DIAGNOSIS — I10 Essential (primary) hypertension: Secondary | ICD-10-CM | POA: Diagnosis not present

## 2012-12-27 DIAGNOSIS — R609 Edema, unspecified: Secondary | ICD-10-CM

## 2012-12-27 DIAGNOSIS — E785 Hyperlipidemia, unspecified: Secondary | ICD-10-CM | POA: Diagnosis not present

## 2012-12-27 NOTE — Telephone Encounter (Signed)
Called [facility] [address] [phone]  AND SPOKE TO MAYKALA PER DR HARDING- LIGHT TO MEDIUM COMPRESSION SLEEVE.  INFORMATION GIVEN TO   MAYKALA. SHE VERBALIZED UNDERSTANDING.

## 2012-12-27 NOTE — Telephone Encounter (Signed)
I talked about this with Jasmine December.  One of the PAs can sign the Rx form if necessary tomorrow.

## 2012-12-27 NOTE — Progress Notes (Signed)
PATIENTGrecia Morton MRN: [id number]  DOB: 1926-06-20   DOV:12/29/2012 PCP: Hollice Espy, MD  Clinic Note: Chief Complaint  Patient presents with  . 8 month visit    no chest pain,no sob , swelling after taking ALEVE-Iinjection in the knee,no labs this year    HPI: [name] Morton is a 79 y.o. female with a PMH below who presents today for 6-8 month follow-up. She is a prior patient of Dr. Julieanne Manson who he was following for hypertension and kind of atypical chest discomfort. She had a preoperative cardiac evaluation with a nuclear stress test in October 2011 with no evidence of ischemia and distant echocardiogram from back in 1996 which showed hyperdynamic left ventricle with on real abnormality.  When I last saw her, she was contemplating the possibility of undergoing knee surgery. She is still not done not. And now she is really limited in her activity by her knee pain. She's been getting multiple shots, and is hoping to put off surgery as much as possible unless she "must do it. "  Interval History: Overall from a cardiac standpoint she is doing relatively well. As I mentioned she is limited as far as her activity by her left knee. She says that she does and if 4 level house, but is really isolated on one floor that she is on. Rate difficult for her to go up and down steps. She denies any chest tightness or pressure with rest exertion. No dyspnea with rest or exertion , although when she pushes herself as her knee pain she does get "winded" he denies any PND, orthopnea or edema. No lightheadedness, dizziness, weakness, syncope or near-syncope. No TIA or RCA symptoms. No melena, but has had some intermittent bouts of diarrhea with bloody stools.  -- She has a colonoscopy planned for November 13. She denies any hematochezia. No claudication.  She does have some mild edema, but is well-controlled with her furosemide. Notably, she has swelling of the right arm related to her  lymphedema from breast surgery.   Past Medical History  Diagnosis Date  . Hypertension   . History of nuclear stress test 11/2009    dipyridamole; no evidence of ischemia, normal, low risk   . Dyslipidemia   . Breast cancer 1997    right - tx with lumpectomty & radiation  . OA (osteoarthritis)   . GERD (gastroesophageal reflux disease)     Prior Cardiac Evaluation and Past Surgical History: Past Surgical History  Procedure Laterality Date  . Replacement total knee  2002  . Breast lumpectomy Right 1997  . Rotator cuff repair Right 12/2009    Dr. Shela Commons. [place]   . Appendectomy  1951  . Abdominal hysterectomy  1973  . Transthoracic echocardiogram  05/1994    trace mitral/tricuspic/aortic inusff; hyperkinetic LV   Allergies  Allergen Reactions  . Benadryl [Diphenhydramine Hcl (Sleep)]   . Cardizem [Diltiazem Hcl]   . Vytorin [Ezetimibe-Simvastatin]     Current Outpatient Prescriptions  Medication Sig Dispense Refill  . amLODipine-benazepril (LOTREL) 10-20 MG per capsule Take 1 capsule by mouth daily.  90 capsule  3  . aspirin 81 MG tablet Take 81 mg by mouth daily.      . bisoprolol (ZEBETA) 5 MG tablet TAKE 1 TABLET DAILY  90 tablet  0  . Calcium Carbonate-Vitamin D (CALCIUM + D PO) Take 1,200 mg by mouth daily.      . cetirizine (ZYRTEC) 10 MG tablet Take 10 mg by mouth daily as needed  for allergies.      . Coenzyme Q10 (CO Q-10) 100 MG CAPS Take 1 capsule by mouth daily.      . furosemide (LASIX) 40 MG tablet Take 40 mg by mouth daily.      . Glucosamine-Chondroitin (OSTEO BI-FLEX REGULAR STRENGTH PO) Take 1,500-1,998 mg by mouth 2 (two) times daily.      . Multiple Vitamin (MULTIVITAMIN) capsule Take 1 capsule by mouth daily.      . Omega-3 Fatty Acids (FISH OIL) 1000 MG CAPS Take by mouth 2 (two) times daily.      . simvastatin (ZOCOR) 20 MG tablet Take 1 tablet (20 mg total) by mouth at bedtime.  90 tablet  3  . traZODone (DESYREL) 50 MG tablet Take 100 mg by mouth at  bedtime.      . vitamin E 400 UNIT capsule Take 400 Units by mouth daily.       No current facility-administered medications for this visit.    History   Social History Narrative   Widowed since 11/2010.  Mother of 2.  No real exercise due to knee.   Smoki: No   EtOH: NO   ROS: A comprehensive Review of Systems - Negative except Symptoms noted above. Most notably her left knee pain, and the diarrhea with mild blood.  PHYSICAL EXAM BP 120/62  Ht 5' (1.524 m)  Wt 148 lb 1.6 oz (67.178 kg)  BMI 28.92 kg/m2 General appearance: alert, cooperative, appears stated age, no distress and Well-nourished and well-groomed. Pleasant mood and affect. Answers questions appropriately Neck: no adenopathy, no carotid bruit, no JVD and supple, symmetrical, trachea midline Lungs: clear to auscultation bilaterally, normal percussion bilaterally and Nonlabored, good air movement Heart: regular rate and rhythm, S1, S2 normal, no S3 or S4 and Soft 1/6 SEM at RUSB radiating to carotids. No other M./R./G. nondisplaced PMI Abdomen: soft, non-tender; bowel sounds normal; no masses,  no organomegaly Extremities: extremities normal, atraumatic, no cyanosis or edema and varicose veins noted Pulses: 2+ and symmetric Neurologic: Grossly normal  JXB:JYNWGNFAO today: Yes Rate: 61 , Rhythm:  NSR, NSST-abnormalities   Recent Labs: none recent. Most recent lipids: Total cholesterol 172, triglycerides 149, HDL 63, LDL 79. -- Monitored by PCP  ASSESSMENT / PLAN: Hypertension Blood pressure is well-controlled on beta blocker and ACE inhibitor/CCB combination  She does have some ups and downs of her pressures, and also has some lows that are somewhat concerning. I've asked her to take the Lotrel along with her furosemide at lunch to avoid the morning lows. She prefers to take Lasix once she's been up for a while, because she feels it works better. I recommended that she does not take it beyond 2 to 3 PM  Bilateral leg  edema Well-controlled with furosemide.  Arm edema - right Likely due to postsurgical lymphedema. Recommend that she wears an arm compression sleeve to give her prescription for this which she will get filled. Light weight  Dyslipidemia On simvastatin, and well-controlled for her risk category. Being followed by PCP   Orders Placed This Encounter  Procedures  . EKG 12-Lead   Followup: 9 months   Joene Gelder W. Herbie Baltimore, M.D., M.S. [facility] [address] [place], [place]  [address]  [phone] Pager # [phone]

## 2012-12-27 NOTE — Patient Instructions (Signed)
Start taking FUROSEMIDE and Amlodipine-benazepril at lunchtime  Your physician wants you to follow-up in 9 months  Dr Herbie Baltimore. You will receive a reminder letter in the mail two months in advance. If you don't receive a letter, please call our office to schedule the follow-up appointment.  Wear compression sleeve

## 2012-12-27 NOTE — Telephone Encounter (Signed)
Returned call and pt verified x 2 w/ daughter, Camelia Eng.  Stated they went to [facility] on [address] [phone]) and pt was being fitted for the compression sleeve.  Stated Lynden Ang, Facilities manager, asked what size did Dr. Herbie Baltimore order for the sleeve and she didn't remember a size being given.  Also stated Lynden Ang said pt would benefit at [facility] b/c they have specialists w/ lymphedema who can evaluate and treat pt.  Stated she called [facility] [phone]) and spoke w/ Harriett Sine.  Stated she was told that all the doctor needed was to fax a form (referral) to them, Attn: Harriett Sine.  Daughter did not have fax number w/ her and informed Dr. Herbie Baltimore would be notified first and if approved then fax would be needed.  Also advised Jasmine December, RN will be notified r/t size of compression sleeve needed.  Verbalized understanding and agreed w/ plan.  Message forwarded to Dr. Donneta Romberg, RN.  Daughter aware Dr. Herbie Baltimore had a half-day schedule and has left for day, but will be notified.  1) Compression sleeve size 2) Referral to [facility]?

## 2012-12-27 NOTE — Telephone Encounter (Signed)
Pleas asap-Needs an order for a compression sleeve.She will give you details when you call.

## 2012-12-29 DIAGNOSIS — R600 Localized edema: Secondary | ICD-10-CM | POA: Insufficient documentation

## 2012-12-29 DIAGNOSIS — E785 Hyperlipidemia, unspecified: Secondary | ICD-10-CM | POA: Insufficient documentation

## 2012-12-29 DIAGNOSIS — I10 Essential (primary) hypertension: Secondary | ICD-10-CM | POA: Insufficient documentation

## 2012-12-29 NOTE — Assessment & Plan Note (Signed)
Likely due to postsurgical lymphedema. Recommend that she wears an arm compression sleeve to give her prescription for this which she will get filled. Light weight

## 2012-12-29 NOTE — Assessment & Plan Note (Signed)
Blood pressure is well-controlled on beta blocker and ACE inhibitor/CCB combination  She does have some ups and downs of her pressures, and also has some lows that are somewhat concerning. I've asked her to take the Lotrel along with her furosemide at lunch to avoid the morning lows. She prefers to take Lasix once she's been up for a while, because she feels it works better. I recommended that she does not take it beyond 2 to 3 PM

## 2012-12-29 NOTE — Assessment & Plan Note (Signed)
Well-controlled with furosemide.

## 2012-12-29 NOTE — Assessment & Plan Note (Signed)
On simvastatin, and well-controlled for her risk category. Being followed by PCP

## 2013-01-17 DIAGNOSIS — D126 Benign neoplasm of colon, unspecified: Secondary | ICD-10-CM | POA: Diagnosis not present

## 2013-01-17 DIAGNOSIS — K648 Other hemorrhoids: Secondary | ICD-10-CM | POA: Diagnosis not present

## 2013-01-17 DIAGNOSIS — K5730 Diverticulosis of large intestine without perforation or abscess without bleeding: Secondary | ICD-10-CM | POA: Diagnosis not present

## 2013-01-17 DIAGNOSIS — K625 Hemorrhage of anus and rectum: Secondary | ICD-10-CM | POA: Diagnosis not present

## 2013-01-19 ENCOUNTER — Telehealth: Payer: Self-pay | Admitting: Cardiology

## 2013-01-19 NOTE — Telephone Encounter (Signed)
Call to [name], pt's daughter, and asked if Rx was given to her.  Stated she has it in her hand right now.  Stated the lady from [facility] just called her and she told her she has the original.  Daughter informed copy of the Rx is likely out to be scanned and not available in the office.  Asked her if she would take Rx to [facility] to place on file or copy.  Daughter stated she will take it up there, but they are not getting the original.  Stated she gave them a copy of the Rx when pt went in.  RN thanked her for agreeing to take Rx there.

## 2013-01-19 NOTE — Telephone Encounter (Signed)
Please fax a prescription for patient's arm compression sleeve to [facility]  Att: Dewayne Hatch  Fax # [phone]

## 2013-01-20 ENCOUNTER — Other Ambulatory Visit: Payer: Self-pay | Admitting: *Deleted

## 2013-01-20 MED ORDER — FUROSEMIDE 40 MG PO TABS: 40.0000 mg | ORAL_TABLET | Freq: Every day | ORAL | Status: DC

## 2013-01-20 NOTE — Telephone Encounter (Signed)
Rx was sent to pharmacy electronically. 

## 2013-02-02 DIAGNOSIS — M1710 Unilateral primary osteoarthritis, unspecified knee: Secondary | ICD-10-CM | POA: Diagnosis not present

## 2013-02-08 DIAGNOSIS — R5381 Other malaise: Secondary | ICD-10-CM | POA: Diagnosis not present

## 2013-02-08 DIAGNOSIS — G4700 Insomnia, unspecified: Secondary | ICD-10-CM | POA: Diagnosis not present

## 2013-02-08 DIAGNOSIS — R209 Unspecified disturbances of skin sensation: Secondary | ICD-10-CM | POA: Diagnosis not present

## 2013-02-08 DIAGNOSIS — Z79899 Other long term (current) drug therapy: Secondary | ICD-10-CM | POA: Diagnosis not present

## 2013-02-08 DIAGNOSIS — D649 Anemia, unspecified: Secondary | ICD-10-CM | POA: Diagnosis not present

## 2013-03-03 ENCOUNTER — Telehealth: Payer: Self-pay | Admitting: Cardiology

## 2013-03-03 ENCOUNTER — Other Ambulatory Visit: Payer: Self-pay | Admitting: Cardiology

## 2013-03-03 NOTE — Telephone Encounter (Signed)
Spoke to patient -- informed her that Dr Ellyn Hack stated she did not need any additional work up Informed her to have Dr Wynelle Link 's office  Send letter for clearance.

## 2013-03-03 NOTE — Telephone Encounter (Signed)
Rx was sent to pharmacy electronically. 

## 2013-03-03 NOTE — Telephone Encounter (Signed)
Needs clearance for surgery - please advise

## 2013-03-03 NOTE — Telephone Encounter (Signed)
Patient is scheduled for knee surgery March 2.  Needs clearance from Dr. Ellyn Hack.  Pt saw Ellyn Hack in October.  Does she need another appointment. Patient states papers have been sent to Dr. Ellyn Hack. If patient is not home, please leave a message.

## 2013-03-03 NOTE — Telephone Encounter (Signed)
Does not have any significant CAD history - OK for surgery without additional work-up.  Leonie Man, MD

## 2013-03-21 DIAGNOSIS — D649 Anemia, unspecified: Secondary | ICD-10-CM | POA: Diagnosis not present

## 2013-03-30 DIAGNOSIS — H2640 Unspecified secondary cataract: Secondary | ICD-10-CM | POA: Diagnosis not present

## 2013-03-30 DIAGNOSIS — H5310 Unspecified subjective visual disturbances: Secondary | ICD-10-CM | POA: Diagnosis not present

## 2013-03-30 DIAGNOSIS — H04129 Dry eye syndrome of unspecified lacrimal gland: Secondary | ICD-10-CM | POA: Diagnosis not present

## 2013-03-30 DIAGNOSIS — H43819 Vitreous degeneration, unspecified eye: Secondary | ICD-10-CM | POA: Diagnosis not present

## 2013-04-19 NOTE — Progress Notes (Signed)
Please put orders in Epic surgery 05-01-13 pre op 04-25-13 Thank you!

## 2013-04-20 ENCOUNTER — Other Ambulatory Visit: Payer: Self-pay | Admitting: Orthopedic Surgery

## 2013-04-20 ENCOUNTER — Encounter (HOSPITAL_COMMUNITY): Payer: Self-pay | Admitting: Pharmacy Technician

## 2013-04-25 ENCOUNTER — Encounter (INDEPENDENT_AMBULATORY_CARE_PROVIDER_SITE_OTHER): Payer: Self-pay

## 2013-04-25 ENCOUNTER — Ambulatory Visit (HOSPITAL_COMMUNITY)
Admission: RE | Admit: 2013-04-25 | Discharge: 2013-04-25 | Disposition: A | Discharge status: Home / Self Care | Payer: Medicare Other | Source: Ambulatory Visit | Attending: Orthopedic Surgery | Admitting: Orthopedic Surgery

## 2013-04-25 ENCOUNTER — Encounter (HOSPITAL_COMMUNITY): Payer: Self-pay

## 2013-04-25 ENCOUNTER — Encounter (HOSPITAL_COMMUNITY)
Admission: RE | Admit: 2013-04-25 | Discharge: 2013-04-25 | Disposition: A | Discharge status: Home / Self Care | Payer: Medicare Other | Source: Ambulatory Visit | Attending: Orthopedic Surgery | Admitting: Orthopedic Surgery

## 2013-04-25 DIAGNOSIS — Z01812 Encounter for preprocedural laboratory examination: Secondary | ICD-10-CM | POA: Insufficient documentation

## 2013-04-25 DIAGNOSIS — Z01818 Encounter for other preprocedural examination: Secondary | ICD-10-CM | POA: Diagnosis not present

## 2013-04-25 DIAGNOSIS — R918 Other nonspecific abnormal finding of lung field: Secondary | ICD-10-CM | POA: Diagnosis not present

## 2013-04-25 HISTORY — DX: Lymphedema, not elsewhere classified: I89.0

## 2013-04-25 HISTORY — DX: Unspecified hearing loss, unspecified ear: H91.90

## 2013-04-25 HISTORY — DX: Unspecified disorder of nose and nasal sinuses: J34.9

## 2013-04-25 HISTORY — DX: Sleep disorder, unspecified: G47.9

## 2013-04-25 HISTORY — DX: Personal history of other infectious and parasitic diseases: Z86.19

## 2013-04-25 HISTORY — DX: Diverticulosis of intestine, part unspecified, without perforation or abscess without bleeding: K57.90

## 2013-04-25 HISTORY — DX: Shortness of breath: R06.02

## 2013-04-25 HISTORY — DX: Reserved for inherently not codable concepts without codable children: IMO0001

## 2013-04-25 LAB — URINALYSIS, ROUTINE W REFLEX MICROSCOPIC
Bilirubin Urine: NEGATIVE
GLUCOSE, UA: NEGATIVE mg/dL
Hgb urine dipstick: NEGATIVE
Ketones, ur: NEGATIVE mg/dL
LEUKOCYTES UA: NEGATIVE
Nitrite: NEGATIVE
PH: 5 (ref 5.0–8.0)
PROTEIN: NEGATIVE mg/dL
SPECIFIC GRAVITY, URINE: 1.025 (ref 1.005–1.030)
Urobilinogen, UA: 0.2 mg/dL (ref 0.0–1.0)

## 2013-04-25 LAB — CBC
HCT: 38.1 % (ref 36.0–46.0)
HEMOGLOBIN: 12.9 g/dL (ref 12.0–15.0)
MCH: 29.7 pg (ref 26.0–34.0)
MCHC: 33.9 g/dL (ref 30.0–36.0)
MCV: 87.8 fL (ref 78.0–100.0)
Platelets: 250 10*3/uL (ref 150–400)
RBC: 4.34 MIL/uL (ref 3.87–5.11)
RDW: 13 % (ref 11.5–15.5)
WBC: 6.7 10*3/uL (ref 4.0–10.5)

## 2013-04-25 LAB — SURGICAL PCR SCREEN
MRSA, PCR: NEGATIVE
Staphylococcus aureus: NEGATIVE

## 2013-04-25 LAB — COMPREHENSIVE METABOLIC PANEL
ALT: 15 U/L (ref 0–35)
AST: 18 U/L (ref 0–37)
Albumin: 4.3 g/dL (ref 3.5–5.2)
Alkaline Phosphatase: 71 U/L (ref 39–117)
BILIRUBIN TOTAL: 0.3 mg/dL (ref 0.3–1.2)
BUN: 22 mg/dL (ref 6–23)
CO2: 28 meq/L (ref 19–32)
Calcium: 10.4 mg/dL (ref 8.4–10.5)
Chloride: 101 mEq/L (ref 96–112)
Creatinine, Ser: 1.08 mg/dL (ref 0.50–1.10)
GFR, EST AFRICAN AMERICAN: 53 mL/min — AB (ref >90)
GFR, EST NON AFRICAN AMERICAN: 46 mL/min — AB (ref >90)
GLUCOSE: 106 mg/dL — AB (ref 70–99)
POTASSIUM: 4.8 meq/L (ref 3.7–5.3)
Sodium: 141 mEq/L (ref 137–147)
Total Protein: 7.4 g/dL (ref 6.0–8.3)

## 2013-04-25 LAB — PROTIME-INR
INR: 0.92 (ref 0.00–1.49)
Prothrombin Time: 12.2 seconds (ref 11.6–15.2)

## 2013-04-25 LAB — ABO/RH: ABO/RH(D): O POS

## 2013-04-25 LAB — APTT: aPTT: 33 seconds (ref 24–37)

## 2013-04-25 NOTE — Pre-Procedure Instructions (Signed)
EKG AND CARDIOLOGY OFFICE NOTES DR. DAVID HARDING IN EPIC FROM 12/27/12. CARDIAC CLEARANCE ON CHART FROM DR. HARDING. CXR WAS DONE TODAY - PREOP AT [facility]

## 2013-04-25 NOTE — Patient Instructions (Addendum)
   YOUR SURGERY IS SCHEDULED AT [facility]  ON:  Monday  3/2  REPORT TO  SHORT STAY CENTER AT:  7:30 AM      PHONE # FOR SHORT STAY IS [phone]  DO NOT EAT OR DRINK ANYTHING AFTER MIDNIGHT THE NIGHT BEFORE YOUR SURGERY.  YOU MAY BRUSH YOUR TEETH, RINSE OUT YOUR MOUTH--BUT NO WATER, NO FOOD, NO CHEWING GUM, NO MINTS, NO CANDIES, NO CHEWING TOBACCO.  PLEASE TAKE THE FOLLOWING MEDICATIONS THE AM OF YOUR SURGERY WITH A FEW SIPS OF WATER:  ZEBETA   DO NOT BRING VALUABLES, MONEY, CREDIT CARDS.  DO NOT WEAR JEWELRY, MAKE-UP, NAIL POLISH AND NO METAL PINS OR CLIPS IN YOUR HAIR. CONTACT LENS, DENTURES / PARTIALS, GLASSES SHOULD NOT BE WORN TO SURGERY AND IN MOST CASES-HEARING AIDS WILL NEED TO BE REMOVED.  BRING YOUR GLASSES CASE, ANY EQUIPMENT NEEDED FOR YOUR CONTACT LENS. FOR PATIENTS ADMITTED TO THE HOSPITAL--CHECK OUT TIME THE DAY OF DISCHARGE IS 11:00 AM.  ALL INPATIENT ROOMS ARE PRIVATE - WITH BATHROOM, TELEPHONE, TELEVISION AND WIFI INTERNET.  IF YOU ARE BEING DISCHARGED THE SAME DAY OF YOUR SURGERY--YOU CAN NOT DRIVE YOURSELF HOME--AND SHOULD NOT GO HOME ALONE BY TAXI OR BUS.  NO DRIVING OR OPERATING MACHINERY, OR MAKING LEGAL DECISIONS FOR 24 HOURS FOLLOWING ANESTHESIA / PAIN MEDICATIONS.  PLEA                                                   PLEASE READ OVER ANY  FACT SHEETS THAT YOU WERE GIVEN: MRSA INFORMATION,  INCENTIVE SPIROMETER INFORMATION.  FAILURE TO FOLLOW THESE INSTRUCTIONS MAY RESULT IN THE CANCELLATION OF YOUR SURGERY. PLEASE BE AWARE THAT YOU MAY NEED ADDITIONAL BLOOD DRAWN DAY OF YOUR SURGERY  PATIENT SIGNATURE_________________________________

## 2013-04-30 ENCOUNTER — Other Ambulatory Visit: Payer: Self-pay | Admitting: Orthopedic Surgery

## 2013-04-30 NOTE — H&P (Signed)
[name] Morton  DOB: September 07, 1926 Married / Language: English / Race: White Female  Date of Admission:  05-01-2013  Chief Complaint:  Left Knee Pain  History of Present Illness The patient is a 80 year old female who comes in for a preoperative History and Physical. The patient is scheduled for a left total knee arthroplasty to be performed by Dr. Dione Plover. Aluisio, MD at [facility] on 05-01-2013. The patient is a 80 year old female who presents for follow up of their knee. The patient is being followed for their left knee pain and osteoarthritis. They are now several vweek(s) out from a Synvisc series. Symptoms reported today include: pain (increased more recently), swelling and locking. The patient feels that they are doing poorly and report their pain level to be moderate. Current treatment includes: pain medications. The following medication has been used for pain control: Tylenol. The patient has not gotten any relief of their symptoms with viscosupplementation. The patient indicates that they have questions or concerns today regarding where to go from here. Unfortunately she did not get better at all with the Synvisc injections. Her pain is getting progressively worse over time. She is having worse functional limitations. She is at a stage where she is ready to go ahead and get the knee fixed. She is ready to proceed with knee replacement. They have been treated conservatively in the past for the above stated problem and despite conservative measures, they continue to have progressive pain and severe functional limitations and dysfunction. They have failed non-operative management including home exercise, medications, and injections. It is felt that they would benefit from undergoing total joint replacement. Risks and benefits of the procedure have been discussed with the patient and they elect to proceed with surgery. There are no active contraindications to surgery such as  ongoing infection or rapidly progressive neurological disease.  Allergies Benadryl *ANTIHISTAMINES* Vistaril *ANTIANXIETY AGENTS* EPINEPHrine *VASOPRESSORS* Cardizem *CALCIUM CHANNEL BLOCKERS*  Problem List/Past Medical Primary osteoarthritis of one knee (715.16) Heart murmur High blood pressure Hypercholesterolemia Breast Cancer. Right-sided Impaired Hearing Shingles Diverticulosis Scarlet Fever Measles Mumps Rubella Menopause   Family History Diabetes Mellitus. mother Cerebrovascular Accident. First Degree Relatives. father   Social History Tobacco / smoke exposure. no Exercise. Exercises weekly; does running / walking Children. 2 Tobacco use. Never smoker. never smoker Marital status. widowed Living situation. live alone Number of flights of stairs before winded. 2-3 Alcohol use. never consumed alcohol Current work status. 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 no Advance Directives. Living Will   Medication History TraMADol HCl (50MG  Tablet, 1-2 Tablet Oral PO BID prn pain, Taken 02/02/2013 to 02/12/2013) Inactive. Famotidine (20MG  (Dis) Tablet, Oral) Active. Bisoprolol Fumarate (5MG  Tablet, Oral) Active. Evista (60MG  Tablet, Oral) Active. Amlodipine Besy-Benazepril HCl (10-20MG  Capsule, Oral) Active. Simvastatin (20MG  Tablet, Oral) Active. Furosemide (40MG  Tablet, Oral) Active. TraZODone HCl (50MG  Tablet, Oral) Active. Vitamin E (200UNIT Tablet, Oral) Active. Aspirin (81MG  Tablet, Oral) Active. Osteo Bi-Flex Regular Strength (250-200MG  Tablet, Oral) Active.   Past Surgical History Hysterectomy. Date: 70. complete (non-cancerous) Rotator Cuff Repair. left Breast Biopsy. bilateral Breast Mass; Local Excision. Date: 28. right Cataract Surgery. Date: 2011. bilateral Arthroscopy of Knee. left Appendectomy. Date: 3.   Review of Systems General:Present- Fatigue. Not Present-  Chills, Fever, Night Sweats, Weight Gain, Weight Loss and Memory Loss. Skin:Not Present- Hives, Itching, Rash, Eczema and Lesions. HEENT:Present- Hearing Loss. Not Present- Tinnitus, Headache, Double Vision, Visual Loss and Dentures. Respiratory:Present- Shortness of breath at rest (steps). Not  Present- Shortness of breath with exertion, Allergies, Coughing up blood and Chronic Cough. Cardiovascular:Not Present- Chest Pain, Racing/skipping heartbeats, Difficulty Breathing Lying Down, Murmur, Swelling and Palpitations. Gastrointestinal:Not Present- Bloody Stool, Heartburn, Abdominal Pain, Vomiting, Nausea, Constipation, Diarrhea, Difficulty Swallowing, Jaundice and Loss of appetitie. Female Genitourinary:Not Present- Blood in Urine, Urinary frequency, Weak urinary stream, Discharge, Flank Pain, Incontinence, Painful Urination, Urgency, Urinary Retention and Urinating at Night. Musculoskeletal:Present- Joint Swelling, Joint Pain and Back Pain. Not Present- Muscle Weakness, Muscle Pain, Morning Stiffness and Spasms. Neurological:Not Present- Tremor, Dizziness, Blackout spells, Paralysis, Difficulty with balance and Weakness. Psychiatric:Not Present- Insomnia.    Vitals Weight: 144 lb Height: 60 in Weight was reported by patient. Height was reported by patient. Body Surface Area: 1.66 m Body Mass Index: 28.12 kg/m Pulse: 68 (Regular) Resp.: 14 (Unlabored) BP: 126/64 (Sitting, Left Arm, Standard)   Physical Exam The physical exam findings are as follows:   General Mental Status - Alert, cooperative and good historian. General Appearance- pleasant. Not in acute distress. Orientation- Oriented X3. Build & Nutrition- Well nourished and Well developed.   Head and Neck Head- normocephalic, atraumatic . Neck Global Assessment- supple. no bruit auscultated on the right and no bruit auscultated on the left.   Eye Vision- Wears corrective lenses (readers).  Pupil- Bilateral- Regular and Round. Motion- Bilateral- EOMI.   Chest and Lung Exam Auscultation: Breath sounds:- clear at anterior chest wall and - clear at posterior chest wall. Adventitious sounds:- No Adventitious sounds.   Cardiovascular Auscultation:Rhythm- Regular rate and rhythm. Heart Sounds- S1 WNL and S2 WNL. Murmurs & Other Heart Sounds: Murmur 1:Location- Pulmonic Area. Timing- Mid-systolic. Grade- II/VI. Character- Low pitched.   Abdomen Palpation/Percussion:Tenderness- Abdomen is non-tender to palpation. Rigidity (guarding)- Abdomen is soft. Auscultation:Auscultation of the abdomen reveals - Bowel sounds normal.   Female Genitourinary Not done, not pertinent to present illness  Musculoskeletal  On exam she is alert and oriented in no apparent distress. The left knee shows no effusion. There is marked crepitus on range of motion of the knee. She is tender medial greater than lateral with no instability noted.   Assessment & Plan Primary osteoarthritis of one knee (715.16) Impression: Left Knee  Note: Plan is for a Left Total Knee Replacement by Dr. Wynelle Link.  Plan is to go home versus inpatient. The patient has a four level home. She plan's to go to her brother's house in [place], [place] if does not go to a SNF.  PCP - Dr. Darcus Austin - Patient has been seen preoperatively and felt to be stable for surgery. Cardiology - Dr. Ellyn Hack  The patient will not receive TXA (tranexamic acid) due to: Breast Cancer  Signed electronically by Joelene Millin, III PA-C

## 2013-05-01 ENCOUNTER — Encounter (HOSPITAL_COMMUNITY): Payer: Medicare Other | Admitting: Anesthesiology

## 2013-05-01 ENCOUNTER — Inpatient Hospital Stay (HOSPITAL_COMMUNITY): Payer: Medicare Other | Admitting: Anesthesiology

## 2013-05-01 ENCOUNTER — Inpatient Hospital Stay (HOSPITAL_COMMUNITY)
Admission: RE | Admit: 2013-05-01 | Discharge: 2013-05-04 | DRG: 470 | Disposition: A | Discharge status: Home Health | Payer: Medicare Other | Source: Ambulatory Visit | Attending: Orthopedic Surgery | Admitting: Orthopedic Surgery

## 2013-05-01 ENCOUNTER — Encounter (HOSPITAL_COMMUNITY): Payer: Self-pay | Admitting: *Deleted

## 2013-05-01 ENCOUNTER — Encounter (HOSPITAL_COMMUNITY)
Admission: RE | Disposition: A | Discharge status: Home Health | Payer: Self-pay | Source: Ambulatory Visit | Attending: Orthopedic Surgery

## 2013-05-01 DIAGNOSIS — I10 Essential (primary) hypertension: Secondary | ICD-10-CM | POA: Diagnosis not present

## 2013-05-01 DIAGNOSIS — I890 Lymphedema, not elsewhere classified: Secondary | ICD-10-CM | POA: Diagnosis present

## 2013-05-01 DIAGNOSIS — H9190 Unspecified hearing loss, unspecified ear: Secondary | ICD-10-CM | POA: Diagnosis not present

## 2013-05-01 DIAGNOSIS — M1710 Unilateral primary osteoarthritis, unspecified knee: Principal | ICD-10-CM | POA: Diagnosis present

## 2013-05-01 DIAGNOSIS — Z6828 Body mass index (BMI) 28.0-28.9, adult: Secondary | ICD-10-CM | POA: Diagnosis not present

## 2013-05-01 DIAGNOSIS — Z96652 Presence of left artificial knee joint: Secondary | ICD-10-CM

## 2013-05-01 DIAGNOSIS — Z853 Personal history of malignant neoplasm of breast: Secondary | ICD-10-CM | POA: Diagnosis not present

## 2013-05-01 DIAGNOSIS — G478 Other sleep disorders: Secondary | ICD-10-CM | POA: Diagnosis present

## 2013-05-01 DIAGNOSIS — K219 Gastro-esophageal reflux disease without esophagitis: Secondary | ICD-10-CM

## 2013-05-01 DIAGNOSIS — M25569 Pain in unspecified knee: Secondary | ICD-10-CM | POA: Diagnosis not present

## 2013-05-01 DIAGNOSIS — K5730 Diverticulosis of large intestine without perforation or abscess without bleeding: Secondary | ICD-10-CM

## 2013-05-01 DIAGNOSIS — M179 Osteoarthritis of knee, unspecified: Secondary | ICD-10-CM

## 2013-05-01 DIAGNOSIS — E785 Hyperlipidemia, unspecified: Secondary | ICD-10-CM | POA: Diagnosis not present

## 2013-05-01 DIAGNOSIS — IMO0002 Reserved for concepts with insufficient information to code with codable children: Secondary | ICD-10-CM | POA: Diagnosis not present

## 2013-05-01 HISTORY — PX: TOTAL KNEE ARTHROPLASTY: SHX125

## 2013-05-01 LAB — TYPE AND SCREEN
ABO/RH(D): O POS
Antibody Screen: NEGATIVE

## 2013-05-01 SURGERY — ARTHROPLASTY, KNEE, TOTAL
Anesthesia: Spinal | Site: Knee | Laterality: Left

## 2013-05-01 MED ORDER — KETOROLAC TROMETHAMINE 15 MG/ML IJ SOLN: 7.5000 mg | Freq: Four times a day (QID) | INTRAMUSCULAR | Status: AC | PRN

## 2013-05-01 MED ORDER — TRAZODONE HCL 100 MG PO TABS
100.0000 mg | ORAL_TABLET | Freq: Every day | ORAL | Status: DC
  Administered 2013-05-01 – 2013-05-03 (×3): 100 mg via ORAL
  Filled 2013-05-01 (×4): qty 1

## 2013-05-01 MED ORDER — DEXAMETHASONE SODIUM PHOSPHATE 10 MG/ML IJ SOLN
10.0000 mg | Freq: Every day | INTRAMUSCULAR | Status: AC
  Filled 2013-05-01: qty 1

## 2013-05-01 MED ORDER — PROMETHAZINE HCL 25 MG/ML IJ SOLN: 6.2500 mg | INTRAMUSCULAR | Status: DC | PRN

## 2013-05-01 MED ORDER — FENTANYL CITRATE 0.05 MG/ML IJ SOLN
INTRAMUSCULAR | Status: DC | PRN
  Administered 2013-05-01: 100 ug via INTRAVENOUS

## 2013-05-01 MED ORDER — FLEET ENEMA 7-19 GM/118ML RE ENEM: 1.0000 | ENEMA | Freq: Once | RECTAL | Status: AC | PRN

## 2013-05-01 MED ORDER — METHOCARBAMOL 100 MG/ML IJ SOLN
500.0000 mg | Freq: Four times a day (QID) | INTRAVENOUS | Status: DC | PRN
  Administered 2013-05-01: 500 mg via INTRAVENOUS
  Filled 2013-05-01: qty 5

## 2013-05-01 MED ORDER — FUROSEMIDE 40 MG PO TABS
40.0000 mg | ORAL_TABLET | Freq: Every day | ORAL | Status: DC
  Administered 2013-05-01 – 2013-05-04 (×4): 40 mg via ORAL
  Filled 2013-05-01 (×4): qty 1

## 2013-05-01 MED ORDER — CEFAZOLIN SODIUM 1-5 GM-% IV SOLN
1.0000 g | Freq: Four times a day (QID) | INTRAVENOUS | Status: AC
Start: 2013-05-01 — End: 2013-05-01
  Administered 2013-05-01 (×2): 1 g via INTRAVENOUS
  Filled 2013-05-01 (×2): qty 50

## 2013-05-01 MED ORDER — MENTHOL 3 MG MT LOZG
1.0000 | LOZENGE | OROMUCOSAL | Status: DC | PRN
  Filled 2013-05-01: qty 9

## 2013-05-01 MED ORDER — OXYCODONE HCL 5 MG PO TABS
5.0000 mg | ORAL_TABLET | ORAL | Status: DC | PRN
  Administered 2013-05-01 – 2013-05-02 (×9): 5 mg via ORAL
  Administered 2013-05-03: 10 mg via ORAL
  Administered 2013-05-03 (×2): 5 mg via ORAL
  Administered 2013-05-04: 10 mg via ORAL
  Administered 2013-05-04: 5 mg via ORAL
  Administered 2013-05-04: 10 mg via ORAL
  Filled 2013-05-01 (×7): qty 1
  Filled 2013-05-01: qty 2
  Filled 2013-05-01 (×3): qty 1
  Filled 2013-05-01: qty 2
  Filled 2013-05-01 (×2): qty 1
  Filled 2013-05-01: qty 2

## 2013-05-01 MED ORDER — LACTATED RINGERS IV SOLN
INTRAVENOUS | Status: DC
  Administered 2013-05-01: 1000 mL via INTRAVENOUS
  Administered 2013-05-01: 11:00:00 via INTRAVENOUS

## 2013-05-01 MED ORDER — SODIUM CHLORIDE 0.9 % IR SOLN
Status: DC | PRN
  Administered 2013-05-01: 1000 mL

## 2013-05-01 MED ORDER — LACTATED RINGERS IV SOLN: INTRAVENOUS | Status: DC

## 2013-05-01 MED ORDER — SIMVASTATIN 20 MG PO TABS
20.0000 mg | ORAL_TABLET | Freq: Every day | ORAL | Status: DC
  Administered 2013-05-01 – 2013-05-03 (×3): 20 mg via ORAL
  Filled 2013-05-01 (×4): qty 1

## 2013-05-01 MED ORDER — DOCUSATE SODIUM 100 MG PO CAPS
100.0000 mg | ORAL_CAPSULE | Freq: Two times a day (BID) | ORAL | Status: DC
  Administered 2013-05-01 – 2013-05-04 (×6): 100 mg via ORAL

## 2013-05-01 MED ORDER — SODIUM CHLORIDE 0.9 % IV SOLN
10.0000 mg | INTRAVENOUS | Status: DC | PRN
  Administered 2013-05-01: 50 ug/min via INTRAVENOUS

## 2013-05-01 MED ORDER — CEFAZOLIN SODIUM-DEXTROSE 2-3 GM-% IV SOLR
2.0000 g | INTRAVENOUS | Status: AC
  Administered 2013-05-01: 2 g via INTRAVENOUS

## 2013-05-01 MED ORDER — AMLODIPINE BESYLATE 10 MG PO TABS
10.0000 mg | ORAL_TABLET | Freq: Once | ORAL | Status: AC
  Administered 2013-05-01: 10 mg via ORAL
  Filled 2013-05-01: qty 1

## 2013-05-01 MED ORDER — SODIUM CHLORIDE 0.9 % IV SOLN: INTRAVENOUS | Status: DC

## 2013-05-01 MED ORDER — 0.9 % SODIUM CHLORIDE (POUR BTL) OPTIME
TOPICAL | Status: DC | PRN
  Administered 2013-05-01: 1000 mL

## 2013-05-01 MED ORDER — BUPIVACAINE LIPOSOME 1.3 % IJ SUSP
INTRAMUSCULAR | Status: DC | PRN
  Administered 2013-05-01: 11:00:00

## 2013-05-01 MED ORDER — ACETAMINOPHEN 650 MG RE SUPP: 650.0000 mg | Freq: Four times a day (QID) | RECTAL | Status: DC | PRN

## 2013-05-01 MED ORDER — BISOPROLOL FUMARATE 5 MG PO TABS
5.0000 mg | ORAL_TABLET | Freq: Every day | ORAL | Status: DC
  Administered 2013-05-02 – 2013-05-03 (×2): 5 mg via ORAL
  Filled 2013-05-01 (×3): qty 1

## 2013-05-01 MED ORDER — METOCLOPRAMIDE HCL 10 MG PO TABS: 5.0000 mg | ORAL_TABLET | Freq: Three times a day (TID) | ORAL | Status: DC | PRN

## 2013-05-01 MED ORDER — CHLORHEXIDINE GLUCONATE 4 % EX LIQD: 60.0000 mL | Freq: Once | CUTANEOUS | Status: DC

## 2013-05-01 MED ORDER — KETAMINE HCL 10 MG/ML IJ SOLN
INTRAMUSCULAR | Status: DC | PRN
  Administered 2013-05-01: 20 mg via INTRAVENOUS

## 2013-05-01 MED ORDER — DEXAMETHASONE 6 MG PO TABS
10.0000 mg | ORAL_TABLET | Freq: Every day | ORAL | Status: AC
  Administered 2013-05-02: 10 mg via ORAL
  Filled 2013-05-01: qty 1

## 2013-05-01 MED ORDER — MORPHINE SULFATE 2 MG/ML IJ SOLN
1.0000 mg | INTRAMUSCULAR | Status: DC | PRN
  Administered 2013-05-01 – 2013-05-02 (×2): 1 mg via INTRAVENOUS
  Filled 2013-05-01 (×3): qty 1

## 2013-05-01 MED ORDER — ONDANSETRON HCL 4 MG PO TABS: 4.0000 mg | ORAL_TABLET | Freq: Four times a day (QID) | ORAL | Status: DC | PRN

## 2013-05-01 MED ORDER — ACETAMINOPHEN 325 MG PO TABS
650.0000 mg | ORAL_TABLET | Freq: Four times a day (QID) | ORAL | Status: DC | PRN
Start: 2013-05-02 — End: 2013-05-04
  Administered 2013-05-03 – 2013-05-04 (×2): 650 mg via ORAL
  Filled 2013-05-01 (×2): qty 2

## 2013-05-01 MED ORDER — BUPIVACAINE IN DEXTROSE 0.75-8.25 % IT SOLN
INTRATHECAL | Status: DC | PRN
  Administered 2013-05-01: 1.6 mL via INTRATHECAL

## 2013-05-01 MED ORDER — POLYETHYLENE GLYCOL 3350 17 G PO PACK: 17.0000 g | PACK | Freq: Every day | ORAL | Status: DC | PRN

## 2013-05-01 MED ORDER — SODIUM CHLORIDE 0.9 % IV SOLN
INTRAVENOUS | Status: DC
  Administered 2013-05-01: 13:00:00 via INTRAVENOUS

## 2013-05-01 MED ORDER — DEXAMETHASONE SODIUM PHOSPHATE 10 MG/ML IJ SOLN
10.0000 mg | Freq: Once | INTRAMUSCULAR | Status: AC
  Administered 2013-05-01: 10 mg via INTRAVENOUS

## 2013-05-01 MED ORDER — ACETAMINOPHEN 10 MG/ML IV SOLN
1000.0000 mg | Freq: Once | INTRAVENOUS | Status: AC
  Administered 2013-05-01: 1000 mg via INTRAVENOUS
  Filled 2013-05-01: qty 100

## 2013-05-01 MED ORDER — BUPIVACAINE LIPOSOME 1.3 % IJ SUSP
20.0000 mL | Freq: Once | INTRAMUSCULAR | Status: DC
Start: 2013-05-01 — End: 2013-05-01
  Filled 2013-05-01: qty 20

## 2013-05-01 MED ORDER — PHENYLEPHRINE HCL 10 MG/ML IJ SOLN
INTRAMUSCULAR | Status: AC
  Filled 2013-05-01: qty 1

## 2013-05-01 MED ORDER — SODIUM CHLORIDE 0.9 % IJ SOLN
INTRAMUSCULAR | Status: AC
Start: 2013-05-01 — End: 2013-05-01
  Filled 2013-05-01: qty 50

## 2013-05-01 MED ORDER — RIVAROXABAN 10 MG PO TABS
10.0000 mg | ORAL_TABLET | Freq: Every day | ORAL | Status: DC
  Administered 2013-05-02 – 2013-05-04 (×3): 10 mg via ORAL
  Filled 2013-05-01 (×4): qty 1

## 2013-05-01 MED ORDER — ACETAMINOPHEN 500 MG PO TABS
1000.0000 mg | ORAL_TABLET | Freq: Four times a day (QID) | ORAL | Status: AC
  Administered 2013-05-01 – 2013-05-02 (×2): 1000 mg via ORAL
  Filled 2013-05-01 (×3): qty 2

## 2013-05-01 MED ORDER — BUPIVACAINE HCL 0.25 % IJ SOLN
INTRAMUSCULAR | Status: DC | PRN
  Administered 2013-05-01: 20 mL

## 2013-05-01 MED ORDER — BISACODYL 10 MG RE SUPP: 10.0000 mg | Freq: Every day | RECTAL | Status: DC | PRN

## 2013-05-01 MED ORDER — METHOCARBAMOL 500 MG PO TABS
500.0000 mg | ORAL_TABLET | Freq: Four times a day (QID) | ORAL | Status: DC | PRN
  Administered 2013-05-02 – 2013-05-04 (×7): 500 mg via ORAL
  Filled 2013-05-01 (×7): qty 1

## 2013-05-01 MED ORDER — PHENYLEPHRINE 40 MCG/ML (10ML) SYRINGE FOR IV PUSH (FOR BLOOD PRESSURE SUPPORT)
PREFILLED_SYRINGE | INTRAVENOUS | Status: AC
  Filled 2013-05-01: qty 10

## 2013-05-01 MED ORDER — PHENYLEPHRINE HCL 10 MG/ML IJ SOLN
INTRAMUSCULAR | Status: DC | PRN
  Administered 2013-05-01 (×4): 80 ug via INTRAVENOUS

## 2013-05-01 MED ORDER — PROPOFOL 10 MG/ML IV BOLUS
INTRAVENOUS | Status: AC
  Filled 2013-05-01: qty 20

## 2013-05-01 MED ORDER — TRAMADOL HCL 50 MG PO TABS
50.0000 mg | ORAL_TABLET | Freq: Four times a day (QID) | ORAL | Status: DC | PRN
  Filled 2013-05-01: qty 2

## 2013-05-01 MED ORDER — PROPOFOL INFUSION 10 MG/ML OPTIME
INTRAVENOUS | Status: DC | PRN
  Administered 2013-05-01: 50 ug/kg/min via INTRAVENOUS

## 2013-05-01 MED ORDER — KETAMINE HCL 10 MG/ML IJ SOLN
INTRAMUSCULAR | Status: AC
  Filled 2013-05-01: qty 1

## 2013-05-01 MED ORDER — CEFAZOLIN SODIUM-DEXTROSE 2-3 GM-% IV SOLR
INTRAVENOUS | Status: AC
  Filled 2013-05-01: qty 50

## 2013-05-01 MED ORDER — FENTANYL CITRATE 0.05 MG/ML IJ SOLN
INTRAMUSCULAR | Status: AC
  Filled 2013-05-01: qty 2

## 2013-05-01 MED ORDER — ONDANSETRON HCL 4 MG/2ML IJ SOLN
4.0000 mg | Freq: Four times a day (QID) | INTRAMUSCULAR | Status: DC | PRN
  Administered 2013-05-01 – 2013-05-02 (×2): 4 mg via INTRAVENOUS
  Filled 2013-05-01 (×2): qty 2

## 2013-05-01 MED ORDER — MIDAZOLAM HCL 5 MG/5ML IJ SOLN
INTRAMUSCULAR | Status: DC | PRN
  Administered 2013-05-01 (×2): 1 mg via INTRAVENOUS

## 2013-05-01 MED ORDER — HYDROMORPHONE HCL PF 1 MG/ML IJ SOLN: 0.2500 mg | INTRAMUSCULAR | Status: DC | PRN

## 2013-05-01 MED ORDER — PHENOL 1.4 % MT LIQD: 1.0000 | OROMUCOSAL | Status: DC | PRN

## 2013-05-01 MED ORDER — EPHEDRINE SULFATE 50 MG/ML IJ SOLN
INTRAMUSCULAR | Status: DC | PRN
  Administered 2013-05-01 (×2): 5 mg via INTRAVENOUS

## 2013-05-01 MED ORDER — BUPIVACAINE HCL (PF) 0.25 % IJ SOLN
INTRAMUSCULAR | Status: AC
  Filled 2013-05-01: qty 30

## 2013-05-01 MED ORDER — MIDAZOLAM HCL 2 MG/2ML IJ SOLN
INTRAMUSCULAR | Status: AC
  Filled 2013-05-01: qty 2

## 2013-05-01 MED ORDER — SODIUM CHLORIDE 0.9 % IV BOLUS (SEPSIS)
250.0000 mL | Freq: Once | INTRAVENOUS | Status: AC
  Administered 2013-05-01: 250 mL via INTRAVENOUS

## 2013-05-01 MED ORDER — LIP MEDEX EX OINT
TOPICAL_OINTMENT | CUTANEOUS | Status: AC
  Administered 2013-05-01: 19:00:00
  Filled 2013-05-01: qty 7

## 2013-05-01 MED ORDER — DEXAMETHASONE SODIUM PHOSPHATE 10 MG/ML IJ SOLN
INTRAMUSCULAR | Status: AC
  Filled 2013-05-01: qty 1

## 2013-05-01 MED ORDER — METOCLOPRAMIDE HCL 5 MG/ML IJ SOLN: 5.0000 mg | Freq: Three times a day (TID) | INTRAMUSCULAR | Status: DC | PRN

## 2013-05-01 SURGICAL SUPPLY — 56 items
BAG ZIPLOCK 12X15 (MISCELLANEOUS) ×3 IMPLANT
BANDAGE ELASTIC 6 VELCRO ST LF (GAUZE/BANDAGES/DRESSINGS) ×3 IMPLANT
BANDAGE ESMARK 6X9 LF (GAUZE/BANDAGES/DRESSINGS) ×1 IMPLANT
BLADE SAG 18X100X1.27 (BLADE) ×3 IMPLANT
BLADE SAW SGTL 11.0X1.19X90.0M (BLADE) ×3 IMPLANT
BNDG CMPR 9X6 STRL LF SNTH (GAUZE/BANDAGES/DRESSINGS) ×1
BNDG ESMARK 6X9 LF (GAUZE/BANDAGES/DRESSINGS) ×3
BOWL SMART MIX CTS (DISPOSABLE) ×3 IMPLANT
CAPT RP KNEE ×3 IMPLANT
CEMENT HV SMART SET (Cement) ×6 IMPLANT
CLOSURE STERI-STRIP 1/4X4 (GAUZE/BANDAGES/DRESSINGS) ×3 IMPLANT
CLOSURE WOUND 1/2 X4 (GAUZE/BANDAGES/DRESSINGS) ×2
CUFF TOURN SGL QUICK 34 (TOURNIQUET CUFF) ×3
CUFF TRNQT CYL 34X4X40X1 (TOURNIQUET CUFF) ×1 IMPLANT
DECANTER SPIKE VIAL GLASS SM (MISCELLANEOUS) ×3 IMPLANT
DRAPE EXTREMITY T 121X128X90 (DRAPE) ×3 IMPLANT
DRAPE POUCH INSTRU U-SHP 10X18 (DRAPES) ×3 IMPLANT
DRAPE U-SHAPE 47X51 STRL (DRAPES) ×3 IMPLANT
DRSG ADAPTIC 3X8 NADH LF (GAUZE/BANDAGES/DRESSINGS) ×3 IMPLANT
DURAPREP 26ML APPLICATOR (WOUND CARE) ×3 IMPLANT
ELECT REM PT RETURN 9FT ADLT (ELECTROSURGICAL) ×3
ELECTRODE REM PT RTRN 9FT ADLT (ELECTROSURGICAL) ×1 IMPLANT
EVACUATOR 1/8 PVC DRAIN (DRAIN) ×3 IMPLANT
FACESHIELD LNG OPTICON STERILE (SAFETY) ×15 IMPLANT
GLOVE BIO SURGEON STRL SZ7.5 (GLOVE) IMPLANT
GLOVE BIO SURGEON STRL SZ8 (GLOVE) ×3 IMPLANT
GLOVE BIOGEL PI IND STRL 8 (GLOVE) ×2 IMPLANT
GLOVE BIOGEL PI INDICATOR 8 (GLOVE) ×4
GLOVE SURG SS PI 6.5 STRL IVOR (GLOVE) IMPLANT
GOWN STRL REUS W/TWL LRG LVL3 (GOWN DISPOSABLE) ×3 IMPLANT
GOWN STRL REUS W/TWL XL LVL3 (GOWN DISPOSABLE) IMPLANT
HANDPIECE INTERPULSE COAX TIP (DISPOSABLE) ×3
IMMOBILIZER KNEE 20 (SOFTGOODS) ×3 IMPLANT
KIT BASIN OR (CUSTOM PROCEDURE TRAY) ×3 IMPLANT
MANIFOLD NEPTUNE II (INSTRUMENTS) ×3 IMPLANT
NDL SAFETY ECLIPSE 18X1.5 (NEEDLE) ×2 IMPLANT
NEEDLE HYPO 18GX1.5 SHARP (NEEDLE) ×6
NS IRRIG 1000ML POUR BTL (IV SOLUTION) ×3 IMPLANT
PACK TOTAL JOINT (CUSTOM PROCEDURE TRAY) ×3 IMPLANT
PAD ABD 8X10 STRL (GAUZE/BANDAGES/DRESSINGS) ×3 IMPLANT
PADDING CAST COTTON 6X4 STRL (CAST SUPPLIES) ×3 IMPLANT
POSITIONER SURGICAL ARM (MISCELLANEOUS) ×3 IMPLANT
SET HNDPC FAN SPRY TIP SCT (DISPOSABLE) ×1 IMPLANT
SPONGE GAUZE 4X4 12PLY (GAUZE/BANDAGES/DRESSINGS) ×3 IMPLANT
STRIP CLOSURE SKIN 1/2X4 (GAUZE/BANDAGES/DRESSINGS) ×4 IMPLANT
SUCTION FRAZIER 12FR DISP (SUCTIONS) ×3 IMPLANT
SUT MNCRL AB 4-0 PS2 18 (SUTURE) ×3 IMPLANT
SUT VIC AB 2-0 CT1 27 (SUTURE) ×9
SUT VIC AB 2-0 CT1 TAPERPNT 27 (SUTURE) ×3 IMPLANT
SUT VLOC 180 0 24IN GS25 (SUTURE) ×3 IMPLANT
SYR 20CC LL (SYRINGE) ×3 IMPLANT
SYR 50ML LL SCALE MARK (SYRINGE) ×3 IMPLANT
TOWEL OR 17X26 10 PK STRL BLUE (TOWEL DISPOSABLE) ×6 IMPLANT
TRAY FOLEY CATH 14FRSI W/METER (CATHETERS) ×3 IMPLANT
WATER STERILE IRR 1500ML POUR (IV SOLUTION) ×3 IMPLANT
WRAP KNEE MAXI GEL POST OP (GAUZE/BANDAGES/DRESSINGS) ×3 IMPLANT

## 2013-05-01 NOTE — Preoperative (Signed)
Beta Blockers   Reason not to administer Beta Blockers:Took Zebeta this am.

## 2013-05-01 NOTE — Interval H&P Note (Signed)
History and Physical Interval Note:  05/01/2013 9:46 AM  [name] Morton  has presented today for surgery, with the diagnosis of osteoarthritis of the left knee  The various methods of treatment have been discussed with the patient and family. After consideration of risks, benefits and other options for treatment, the patient has consented to  Procedure(s): LEFT TOTAL KNEE ARTHROPLASTY (Left) as a surgical intervention .  The patient's history has been reviewed, patient examined, no change in status, stable for surgery.  I have reviewed the patient's chart and labs.  Questions were answered to the patient's satisfaction.     Gearlean Alf

## 2013-05-01 NOTE — Progress Notes (Signed)
Utilization review completed.  

## 2013-05-01 NOTE — H&P (View-Only) (Signed)
[name] Morton  DOB: September 07, 1926 Married / Language: English / Race: White Female  Date of Admission:  05-01-2013  Chief Complaint:  Left Knee Pain  History of Present Illness The patient is a 80 year old female who comes in for a preoperative History and Physical. The patient is scheduled for a left total knee arthroplasty to be performed by Dr. Dione Plover. Aluisio, MD at [facility] on 05-01-2013. The patient is a 80 year old female who presents for follow up of their knee. The patient is being followed for their left knee pain and osteoarthritis. They are now several vweek(s) out from a Synvisc series. Symptoms reported today include: pain (increased more recently), swelling and locking. The patient feels that they are doing poorly and report their pain level to be moderate. Current treatment includes: pain medications. The following medication has been used for pain control: Tylenol. The patient has not gotten any relief of their symptoms with viscosupplementation. The patient indicates that they have questions or concerns today regarding where to go from here. Unfortunately she did not get better at all with the Synvisc injections. Her pain is getting progressively worse over time. She is having worse functional limitations. She is at a stage where she is ready to go ahead and get the knee fixed. She is ready to proceed with knee replacement. They have been treated conservatively in the past for the above stated problem and despite conservative measures, they continue to have progressive pain and severe functional limitations and dysfunction. They have failed non-operative management including home exercise, medications, and injections. It is felt that they would benefit from undergoing total joint replacement. Risks and benefits of the procedure have been discussed with the patient and they elect to proceed with surgery. There are no active contraindications to surgery such as  ongoing infection or rapidly progressive neurological disease.  Allergies Benadryl *ANTIHISTAMINES* Vistaril *ANTIANXIETY AGENTS* EPINEPHrine *VASOPRESSORS* Cardizem *CALCIUM CHANNEL BLOCKERS*  Problem List/Past Medical Primary osteoarthritis of one knee (715.16) Heart murmur High blood pressure Hypercholesterolemia Breast Cancer. Right-sided Impaired Hearing Shingles Diverticulosis Scarlet Fever Measles Mumps Rubella Menopause   Family History Diabetes Mellitus. mother Cerebrovascular Accident. First Degree Relatives. father   Social History Tobacco / smoke exposure. no Exercise. Exercises weekly; does running / walking Children. 2 Tobacco use. Never smoker. never smoker Marital status. widowed Living situation. live alone Number of flights of stairs before winded. 2-3 Alcohol use. never consumed alcohol Current work status. 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 no Advance Directives. Living Will   Medication History TraMADol HCl (50MG  Tablet, 1-2 Tablet Oral PO BID prn pain, Taken 02/02/2013 to 02/12/2013) Inactive. Famotidine (20MG  (Dis) Tablet, Oral) Active. Bisoprolol Fumarate (5MG  Tablet, Oral) Active. Evista (60MG  Tablet, Oral) Active. Amlodipine Besy-Benazepril HCl (10-20MG  Capsule, Oral) Active. Simvastatin (20MG  Tablet, Oral) Active. Furosemide (40MG  Tablet, Oral) Active. TraZODone HCl (50MG  Tablet, Oral) Active. Vitamin E (200UNIT Tablet, Oral) Active. Aspirin (81MG  Tablet, Oral) Active. Osteo Bi-Flex Regular Strength (250-200MG  Tablet, Oral) Active.   Past Surgical History Hysterectomy. Date: 70. complete (non-cancerous) Rotator Cuff Repair. left Breast Biopsy. bilateral Breast Mass; Local Excision. Date: 28. right Cataract Surgery. Date: 2011. bilateral Arthroscopy of Knee. left Appendectomy. Date: 3.   Review of Systems General:Present- Fatigue. Not Present-  Chills, Fever, Night Sweats, Weight Gain, Weight Loss and Memory Loss. Skin:Not Present- Hives, Itching, Rash, Eczema and Lesions. HEENT:Present- Hearing Loss. Not Present- Tinnitus, Headache, Double Vision, Visual Loss and Dentures. Respiratory:Present- Shortness of breath at rest (steps). Not  Present- Shortness of breath with exertion, Allergies, Coughing up blood and Chronic Cough. Cardiovascular:Not Present- Chest Pain, Racing/skipping heartbeats, Difficulty Breathing Lying Down, Murmur, Swelling and Palpitations. Gastrointestinal:Not Present- Bloody Stool, Heartburn, Abdominal Pain, Vomiting, Nausea, Constipation, Diarrhea, Difficulty Swallowing, Jaundice and Loss of appetitie. Female Genitourinary:Not Present- Blood in Urine, Urinary frequency, Weak urinary stream, Discharge, Flank Pain, Incontinence, Painful Urination, Urgency, Urinary Retention and Urinating at Night. Musculoskeletal:Present- Joint Swelling, Joint Pain and Back Pain. Not Present- Muscle Weakness, Muscle Pain, Morning Stiffness and Spasms. Neurological:Not Present- Tremor, Dizziness, Blackout spells, Paralysis, Difficulty with balance and Weakness. Psychiatric:Not Present- Insomnia.    Vitals Weight: 144 lb Height: 60 in Weight was reported by patient. Height was reported by patient. Body Surface Area: 1.66 m Body Mass Index: 28.12 kg/m Pulse: 68 (Regular) Resp.: 14 (Unlabored) BP: 126/64 (Sitting, Left Arm, Standard)   Physical Exam The physical exam findings are as follows:   General Mental Status - Alert, cooperative and good historian. General Appearance- pleasant. Not in acute distress. Orientation- Oriented X3. Build & Nutrition- Well nourished and Well developed.   Head and Neck Head- normocephalic, atraumatic . Neck Global Assessment- supple. no bruit auscultated on the right and no bruit auscultated on the left.   Eye Vision- Wears corrective lenses (readers).  Pupil- Bilateral- Regular and Round. Motion- Bilateral- EOMI.   Chest and Lung Exam Auscultation: Breath sounds:- clear at anterior chest wall and - clear at posterior chest wall. Adventitious sounds:- No Adventitious sounds.   Cardiovascular Auscultation:Rhythm- Regular rate and rhythm. Heart Sounds- S1 WNL and S2 WNL. Murmurs & Other Heart Sounds: Murmur 1:Location- Pulmonic Area. Timing- Mid-systolic. Grade- II/VI. Character- Low pitched.   Abdomen Palpation/Percussion:Tenderness- Abdomen is non-tender to palpation. Rigidity (guarding)- Abdomen is soft. Auscultation:Auscultation of the abdomen reveals - Bowel sounds normal.   Female Genitourinary Not done, not pertinent to present illness  Musculoskeletal  On exam she is alert and oriented in no apparent distress. The left knee shows no effusion. There is marked crepitus on range of motion of the knee. She is tender medial greater than lateral with no instability noted.   Assessment & Plan Primary osteoarthritis of one knee (715.16) Impression: Left Knee  Note: Plan is for a Left Total Knee Replacement by Dr. Aluisio.  Plan is to go home versus inpatient. The patient has a four level home. She plan's to go to her brother's house in [place], [place] if does not go to a SNF.  PCP - Dr. Donna Gates - Patient has been seen preoperatively and felt to be stable for surgery. Cardiology - Dr. Harding  The patient will not receive TXA (tranexamic acid) due to: Breast Cancer  Signed electronically by Alexzandrew L Perkins, III PA-C 

## 2013-05-01 NOTE — Op Note (Signed)
Pre-operative diagnosis- Osteoarthritis  Left knee(s)  Post-operative diagnosis- Osteoarthritis Left knee(s)  Procedure-  Left  Total Knee Arthroplasty  Surgeon- Dione Plover. Bently Morath, MD  Assistant- Arlee Muslim, PA-C   Anesthesia-  Spinal EBL-* No blood loss amount entered *  Drains Hemovac  Tourniquet time-  Total Tourniquet Time Documented: Thigh (Left) - 30 minutes Total: Thigh (Left) - 30 minutes    Complications- None  Condition-PACU - hemodynamically stable.   Brief Clinical Note  [name] Morton is a 80 y.o. year old female with end stage OA of her left knee with progressively worsening pain and dysfunction. She has constant pain, with activity and at rest and significant functional deficits with difficulties even with ADLs. She has had extensive non-op management including analgesics, injections of cortisone and viscosupplements, and home exercise program, but remains in significant pain with significant dysfunction. Radiographs show bone on bone arthritis medial and patellofemoral. She presents now for left Total Knee Arthroplasty.    Procedure in detail---   The patient is brought into the operating room and positioned supine on the operating table. After successful administration of  Spinal,   a tourniquet is placed high on the  Left thigh(s) and the lower extremity is prepped and draped in the usual sterile fashion. Time out is performed by the operating team and then the  Left lower extremity is wrapped in Esmarch, knee flexed and the tourniquet inflated to 300 mmHg.       A midline incision is made with a ten blade through the subcutaneous tissue to the level of the extensor mechanism. A fresh blade is used to make a medial parapatellar arthrotomy. Soft tissue over the proximal medial tibia is subperiosteally elevated to the joint line with a knife and into the semimembranosus bursa with a Cobb elevator. Soft tissue over the proximal lateral tibia is elevated with attention  being paid to avoiding the patellar tendon on the tibial tubercle. The patella is everted, knee flexed 90 degrees and the ACL and PCL are removed. Findings are bone on bone medial and patellofemoral with large medial osteophytes.        The drill is used to create a starting hole in the distal femur and the canal is thoroughly irrigated with sterile saline to remove the fatty contents. The 5 degree Left  valgus alignment guide is placed into the femoral canal and the distal femoral cutting block is pinned to remove 10 mm off the distal femur. Resection is made with an oscillating saw.      The tibia is subluxed forward and the menisci are removed. The extramedullary alignment guide is placed referencing proximally at the medial aspect of the tibial tubercle and distally along the second metatarsal axis and tibial crest. The block is pinned to remove 109mm off the more deficient medial  side. Resection is made with an oscillating saw. Size 3is the most appropriate size for the tibia and the proximal tibia is prepared with the modular drill and keel punch for that size.      The femoral sizing guide is placed and size 2.5 is most appropriate. Rotation is marked off the epicondylar axis and confirmed by creating a rectangular flexion gap at 90 degrees. The size 2.5 cutting block is pinned in this rotation and the anterior, posterior and chamfer cuts are made with the oscillating saw. The intercondylar block is then placed and that cut is made.      Trial size 3 tibial component, trial size 2.5 posterior  stabilized femur and a 12.5  mm posterior stabilized rotating platform insert trial is placed. Full extension is achieved with excellent varus/valgus and anterior/posterior balance throughout full range of motion. The patella is everted and thickness measured to be 22  mm. Free hand resection is taken to 12 mm, a 35 template is placed, lug holes are drilled, trial patella is placed, and it tracks normally. Osteophytes  are removed off the posterior femur with the trial in place. All trials are removed and the cut bone surfaces prepared with pulsatile lavage. Cement is mixed and once ready for implantation, the size 3 tibial implant, size  2.5 posterior stabilized femoral component, and the size 35 patella are cemented in place and the patella is held with the clamp. The trial insert is placed and the knee held in full extension. The Exparel (20 ml mixed with 30 ml saline) and .25% Bupivicaine, are injected into the extensor mechanism, posterior capsule, medial and lateral gutters and subcutaneous tissues.  All extruded cement is removed and once the cement is hard the permanent 12.5 mm posterior stabilized rotating platform insert is placed into the tibial tray.      The wound is copiously irrigated with saline solution and the extensor mechanism closed over a hemovac drain with #1 PDS suture. The tourniquet is released for a total tourniquet time of 30  minutes. Flexion against gravity is 140 degrees and the patella tracks normally. Subcutaneous tissue is closed with 2.0 vicryl and subcuticular with running 4.0 Monocryl. The incision is cleaned and dried and steri-strips and a bulky sterile dressing are applied. The limb is placed into a knee immobilizer and the patient is awakened and transported to recovery in stable condition.      Please note that a surgical assistant was a medical necessity for this procedure in order to perform it in a safe and expeditious manner. Surgical assistant was necessary to retract the ligaments and vital neurovascular structures to prevent injury to them and also necessary for proper positioning of the limb to allow for anatomic placement of the prosthesis.   Dione Plover Huntington Leverich, MD    05/01/2013, 11:37 AM

## 2013-05-01 NOTE — Evaluation (Signed)
Physical Therapy Evaluation Patient Details Name: [name] Morton MRN: [id number] DOB: 10/14/1926 Today's Date: 05/01/2013 Time: 6948-5462 PT Time Calculation (min): 25 min  PT Assessment / Plan / Recommendation History of Present Illness  LTKA  Clinical Impression  Pt tolerated  Well until after standing , pt c/o feeling dizzy  , became somnolent but did respond and assist to move back into bed. Pt will benefit from PT to address problems listed. Pt wants to go to brother's home. wioll see how pt progresses.    PT Assessment  Patient needs continued PT services    Follow Up Recommendations  SNF;Home health PT;Supervision/Assistance - 24 hour (will need to see how pt progresses )    Does the patient have the potential to tolerate intense rehabilitation      Barriers to Discharge        Equipment Recommendations  None recommended by PT    Recommendations for Other Services     Frequency 7X/week    Precautions / Restrictions Precautions Precautions: Fall;Knee Precaution Comments: will easily "pass out",  Required Braces or Orthoses: Knee Immobilizer - Left   Pertinent Vitals/Pain bp 116/55 67 hr       Mobility  Bed Mobility Overal bed mobility: + 2 for safety/equipment;Needs Assistance Bed Mobility: Supine to Sit;Sit to Supine Supine to sit: HOB elevated;Min assist;+2 for safety/equipment Sit to supine: Mod assist;+2 for safety/equipment General bed mobility comments: pt able to move to sitting up at the edge, pt had decreased arousal after standiong and stating  " I am going to pass".  Transfers Overall transfer level: Needs assistance Equipment used: Rolling walker (2 wheeled) Transfers: Sit to/from Stand Sit to Stand: +2 physical assistance;+2 safety/equipment;Mod assist General transfer comment: cues fror UE  Ambulation/Gait Ambulation/Gait assistance: +2 physical assistance;+2 safety/equipment;Mod assist;Min assist Ambulation Distance (Feet): 3  Feet Assistive device: Rolling walker (2 wheeled) General Gait Details: side steps to R    Exercises     PT Diagnosis: Difficulty walking;Generalized weakness;Acute pain  PT Problem List: Decreased strength;Decreased range of motion;Decreased activity tolerance;Decreased mobility;Decreased knowledge of precautions;Cardiopulmonary status limiting activity;Decreased safety awareness;Decreased knowledge of use of DME PT Treatment Interventions: DME instruction;Gait training;Stair training;Functional mobility training;Therapeutic activities;Therapeutic exercise;Patient/family education     PT Goals(Current goals can be found in the care plan section) Acute Rehab PT Goals Patient Stated Goal: To go to my brothers PT Goal Formulation: With patient/family Time For Goal Achievement: 05/08/13 Potential to Achieve Goals: Good  Visit Information  Last PT Received On: 05/01/13 Assistance Needed: +2 History of Present Illness: LTKA       Prior [place] expects to be discharged to:: Private residence Living Arrangements: Other relatives Available Help at Discharge: Family Type of Home: House Home Access: Stairs to enter Home Layout: One level [place]: Environmental consultant - 2 wheels Additional Comments: pt wants to stay with brother and sister in law Prior Function Level of Independence: Independent Communication Communication: No difficulties    Cognition  Cognition Arousal/Alertness: Awake/alert Behavior During Therapy: WFL for tasks assessed/performed Overall Cognitive Status: Within Functional Limits for tasks assessed    Extremity/Trunk Assessment Upper Extremity Assessment Upper Extremity Assessment: Overall WFL for tasks assessed Lower Extremity Assessment Lower Extremity Assessment: LLE deficits/detail LLE Deficits / Details: able to perform SLR   Balance    End of Session PT - End of Session Equipment Utilized During Treatment: Left knee  immobilizer Activity Tolerance: Treatment limited secondary to medical complications (Comment) Patient left: in bed;with call bell/phone  within reach;with family/visitor present Nurse Communication: Mobility status  GP     Claretha Cooper 05/01/2013, 6:24 PM

## 2013-05-01 NOTE — Plan of Care (Signed)
Problem: Consults Goal: Diagnosis- Total Joint Replacement Primary Total Knee     

## 2013-05-01 NOTE — Transfer of Care (Signed)
Immediate Anesthesia Transfer of Care Note  Patient: [name] Morton  Procedure(s) Performed: Procedure(s): LEFT TOTAL KNEE ARTHROPLASTY (Left)  Patient Location: PACU  Anesthesia Type:Regional  Level of Consciousness: awake, alert  and oriented  Airway & Oxygen Therapy: Patient Spontanous Breathing and Patient connected to face mask oxygen  Post-op Assessment: Report given to PACU RN and Post -op Vital signs reviewed and stable  Post vital signs: Reviewed and stable  Complications: No apparent anesthesia complications

## 2013-05-01 NOTE — Anesthesia Preprocedure Evaluation (Signed)
Anesthesia Evaluation  Patient identified by MRN, date of birth, ID band Patient awake    Reviewed: Allergy & Precautions, H&P , NPO status , Patient's Chart, lab work & pertinent test results  Airway Mallampati: II TM Distance: >3 FB     Dental  (+) Teeth Intact, Dental Advisory Given   Pulmonary neg pulmonary ROS, shortness of breath and with exertion,  breath sounds clear to auscultation  Pulmonary exam normal       Cardiovascular hypertension, Pt. on medications and Pt. on home beta blockers Rhythm:Regular Rate:Normal     Neuro/Psych negative neurological ROS  negative psych ROS   GI/Hepatic Neg liver ROS, GERD-  ,  Endo/Other  negative endocrine ROS  Renal/GU negative Renal ROS  negative genitourinary   Musculoskeletal  (+) Arthritis -, Osteoarthritis,    Abdominal   Peds  Hematology negative hematology ROS (+)   Anesthesia Other Findings   Reproductive/Obstetrics                           Anesthesia Physical Anesthesia Plan  ASA: II  Anesthesia Plan: Spinal   Post-op Pain Management:    Induction: Intravenous  Airway Management Planned: Simple Face Mask  Additional Equipment:   Intra-op Plan:   Post-operative Plan:   Informed Consent: I have reviewed the patients History and Physical, chart, labs and discussed the procedure including the risks, benefits and alternatives for the proposed anesthesia with the patient or authorized representative who has indicated his/her understanding and acceptance.   Dental advisory given  Plan Discussed with: CRNA  Anesthesia Plan Comments:         Anesthesia Quick Evaluation

## 2013-05-01 NOTE — Progress Notes (Signed)
Family states as pt is hearing impaired, she tends to get confused.

## 2013-05-01 NOTE — Anesthesia Procedure Notes (Signed)
Spinal  Patient location during procedure: OR Start time: 05/01/2013 10:33 AM End time: 05/01/2013 10:40 AM Staffing CRNA/Resident: Alfonso Patten J Performed by: resident/CRNA  Preanesthetic Checklist Completed: patient identified, surgical consent, pre-op evaluation, timeout performed, IV checked and monitors and equipment checked Spinal Block Patient position: sitting Prep: Betadine and site prepped and draped Patient monitoring: heart rate, continuous pulse ox and blood pressure Approach: midline Location: L3-4 Injection technique: single-shot Needle Needle type: Spinocan  Needle gauge: 22 G Needle length: 9 cm Additional Notes Kit Lot #[id number]  Expires 01/2014       No c/o pain/paresthesia during procedure.  Patient tolerated well.

## 2013-05-02 DIAGNOSIS — K5730 Diverticulosis of large intestine without perforation or abscess without bleeding: Secondary | ICD-10-CM

## 2013-05-02 DIAGNOSIS — K219 Gastro-esophageal reflux disease without esophagitis: Secondary | ICD-10-CM

## 2013-05-02 LAB — CBC
HCT: 26.7 % — ABNORMAL LOW (ref 36.0–46.0)
Hemoglobin: 9.2 g/dL — ABNORMAL LOW (ref 12.0–15.0)
MCH: 29.9 pg (ref 26.0–34.0)
MCHC: 34.5 g/dL (ref 30.0–36.0)
MCV: 86.7 fL (ref 78.0–100.0)
PLATELETS: 185 10*3/uL (ref 150–400)
RBC: 3.08 MIL/uL — AB (ref 3.87–5.11)
RDW: 13.2 % (ref 11.5–15.5)
WBC: 8.3 10*3/uL (ref 4.0–10.5)

## 2013-05-02 LAB — BASIC METABOLIC PANEL
BUN: 14 mg/dL (ref 6–23)
CALCIUM: 9.1 mg/dL (ref 8.4–10.5)
CO2: 22 meq/L (ref 19–32)
Chloride: 103 mEq/L (ref 96–112)
Creatinine, Ser: 0.8 mg/dL (ref 0.50–1.10)
GFR calc Af Amer: 76 mL/min — ABNORMAL LOW (ref >90)
GFR calc non Af Amer: 66 mL/min — ABNORMAL LOW (ref >90)
Glucose, Bld: 175 mg/dL — ABNORMAL HIGH (ref 70–99)
Potassium: 4.6 mEq/L (ref 3.7–5.3)
SODIUM: 139 meq/L (ref 137–147)

## 2013-05-02 MED ORDER — POLYSACCHARIDE IRON COMPLEX 150 MG PO CAPS
150.0000 mg | ORAL_CAPSULE | Freq: Every day | ORAL | Status: DC
  Administered 2013-05-02 – 2013-05-03 (×2): 150 mg via ORAL
  Filled 2013-05-02 (×3): qty 1

## 2013-05-02 NOTE — Progress Notes (Signed)
Physical Therapy Treatment Patient Details Name: [name] Morton MRN: [id number] DOB: 02-17-1927 Today's Date: 05/02/2013 Time: 0626-9485 PT Time Calculation (min): 32 min  PT Assessment / Plan / Recommendation  History of Present Illness LTKA   PT Comments   POD # 1 am session.  Pt was OOB in recliner.  Instructed pt on KI use for amb and applied.  Assisted pt out of recliner to amb in hallway + 2 assist for safety to have recliner follow.  Pt tolerated amb 37 feet with increased time.  Returned to recliner to perform some TKR TE's followed by ICE.  Pt requires increased time and freq rest breaks due to pain despite premedicated.   Follow Up Recommendations  SNF;Home health PT;Supervision/Assistance - 24 hour (pending progress but pt states she would prefer to go home. )     Does the patient have the potential to tolerate intense rehabilitation     Barriers to Discharge        Equipment Recommendations  None recommended by PT    Recommendations for Other Services    Frequency 7X/week   Progress towards PT Goals Progress towards PT goals: Progressing toward goals  Plan      Precautions / Restrictions Precautions Precautions: Fall;Knee Precaution Comments: instructed pt on KI use for amvb Required Braces or Orthoses: Knee Immobilizer - Left Restrictions Weight Bearing Restrictions: No Other Position/Activity Restrictions: WBAT   Pertinent Vitals/Pain C/o 7/10 pain Pre medicated ICE applied    Mobility  Bed Mobility Sit to supine: Min assist;HOB elevated General bed mobility comments: Pt OOB in recliner Transfers Overall transfer level: Needs assistance Equipment used: Rolling walker (2 wheeled) Transfers: Sit to/from Stand Sit to Stand: Min assist General transfer comment: 50% VC's on proper tech and hand placement plus increased time Ambulation/Gait Ambulation/Gait assistance: Min assist Ambulation Distance (Feet): 37 Feet Assistive device: Rolling walker (2  wheeled) Gait Pattern/deviations: Step-to pattern;Decreased stance time - left;Trunk flexed Gait velocity: decreased General Gait Details: 50% VC's on proper sequencing and proper walker to selrf distance.  Also required increased time.    Exercises Total Knee Replacement TE's 10 reps B LE ankle pumps 10 reps towel squeezes 10 reps knee presses 5   reps heel slides  Followed by ICE    PT Goals (current goals can now be found in the care plan section) Acute Rehab PT Goals Patient Stated Goal: go to brother's to recover  Visit Information  Last PT Received On: 05/02/13 Assistance Needed: +1 History of Present Illness: LTKA    Subjective Data  Patient Stated Goal: go to brother's to recover   Cognition  Cognition Arousal/Alertness: Awake/alert Behavior During Therapy: WFL for tasks assessed/performed Overall Cognitive Status: Within Functional Limits for tasks assessed    Balance  General Comments General comments (skin integrity, edema, etc.): Pt has lymphedema in RUE.  Daughter came in during eval and asked about exercises.  Pt has a compression sleeve which doesn't fit.  Gave them information about OP lymphedema program as pt needs to concentrate on knee rehab at this time.  Will provide simple program for scapula and gravity eliminated external rotation as well as theraputty.  Pt really needs to devote time to knee.  Cued her on using incentive spirometer when I entered  End of Session PT - End of Session Equipment Utilized During Treatment: Left knee immobilizer Activity Tolerance: Patient limited by pain Patient left: in chair;with call bell/phone within reach;with family/visitor present   Rica Koyanagi  PTA [facility]  Acute  Rehab Pager      [phone]

## 2013-05-02 NOTE — Progress Notes (Signed)
[facility]  [facility] is providing the following services: [employer]  If patient discharges after hours, please call [phone].   Linward Headland 05/02/2013, 11:58 AM

## 2013-05-02 NOTE — Discharge Instructions (Addendum)
° °Dr. Frank Aluisio °Total Joint Specialist °[facility] °[address] °[place], [place] [address] °[phone] ° °TOTAL KNEE REPLACEMENT POSTOPERATIVE DIRECTIONS ° ° ° °Knee Rehabilitation, Guidelines Following Surgery  °Results after knee surgery are often greatly improved when you follow the exercise, range of motion and muscle strengthening exercises prescribed by your doctor. Safety measures are also important to protect the knee from further injury. Any time any of these exercises cause you to have increased pain or swelling in your knee joint, decrease the amount until you are comfortable again and slowly increase them. If you have problems or questions, call your caregiver or physical therapist for advice.  ° °HOME CARE INSTRUCTIONS  °Remove items at home which could result in a fall. This includes throw rugs or furniture in walking pathways.  °Continue medications as instructed at time of discharge. °You may have some home medications which will be placed on hold until you complete the course of blood thinner medication.  °You may start showering once you are discharged home but do not submerge the incision under water. Just pat the incision dry and apply a dry gauze dressing on daily. °Walk with walker as instructed.  °You may resume a sexual relationship in one month or when given the OK by  your doctor.  °· Use walker as long as suggested by your caregivers. °· Avoid periods of inactivity such as sitting longer than an hour when not asleep. This helps prevent blood clots.  °You may put full weight on your legs and walk as much as is comfortable.  °You may return to work once you are cleared by your doctor.  °Do not drive a car for 6 weeks or until released by you surgeon.  °· Do not drive while taking narcotics.  °Wear the elastic stockings for three weeks following surgery during the day but you may remove then at night. °Make sure you keep all of your appointments after your  operation with all of your doctors and caregivers. You should call the office at the above phone number and make an appointment for approximately two weeks after the date of your surgery. °Change the dressing daily and reapply a dry dressing each time. °Please pick up a stool softener and laxative for home use as long as you are requiring pain medications. °· Continue to use ice on the knee for pain and swelling from surgery. You may notice swelling that will progress down to the foot and ankle.  This is normal after surgery.  Elevate the leg when you are not up walking on it.   °It is important for you to complete the blood thinner medication as prescribed by your doctor. °· Continue to use the breathing machine which will help keep your temperature down.  It is common for your temperature to cycle up and down following surgery, especially at night when you are not up moving around and exerting yourself.  The breathing machine keeps your lungs expanded and your temperature down. ° °RANGE OF MOTION AND STRENGTHENING EXERCISES  °Rehabilitation of the knee is important following a knee injury or an operation. After just a few days of immobilization, the muscles of the thigh which control the knee become weakened and shrink (atrophy). Knee exercises are designed to build up the tone and strength of the thigh muscles and to improve knee motion. Often times heat used for twenty to thirty minutes before working out will loosen up your tissues and help with improving the   range of motion but do not use heat for the first two weeks following surgery. These exercises can be done on a training (exercise) mat, on the floor, on a table or on a bed. Use what ever works the best and is most comfortable for you Knee exercises include:  °Leg Lifts - While your knee is still immobilized in a splint or cast, you can do straight leg raises. Lift the leg to 60 degrees, hold for 3 sec, and slowly lower the leg. Repeat 10-20 times 2-3  times daily. Perform this exercise against resistance later as your knee gets better.  °Quad and Hamstring Sets - Tighten up the muscle on the front of the thigh (Quad) and hold for 5-10 sec. Repeat this 10-20 times hourly. Hamstring sets are done by pushing the foot backward against an object and holding for 5-10 sec. Repeat as with quad sets.  °A rehabilitation program following serious knee injuries can speed recovery and prevent re-injury in the future due to weakened muscles. Contact your doctor or a physical therapist for more information on knee rehabilitation.  ° °SKILLED REHAB INSTRUCTIONS: °If the patient is transferred to a skilled rehab facility following release from the hospital, a list of the current medications will be sent to the facility for the patient to continue.  When discharged from the skilled rehab facility, please have the facility set up the patient's Home Health Physical Therapy prior to being released. Also, the skilled facility will be responsible for providing the patient with their medications at time of release from the facility to include their pain medication, the muscle relaxants, and their blood thinner medication. If the patient is still at the rehab facility at time of the two week follow up appointment, the skilled rehab facility will also need to assist the patient in arranging follow up appointment in our office and any transportation needs. ° °MAKE SURE YOU:  °Understand these instructions.  °Will watch your condition.  °Will get help right away if you are not doing well or get worse.  ° ° °Pick up stool softner and laxative for home. °Do not submerge incision under water. °May shower. °Continue to use ice for pain and swelling from surgery. ° °Take Xarelto for two and a half more weeks, then discontinue Xarelto. °Once the patient has completed the Xarelto, they may resume the 81 mg Aspirin. ° ° °Information on my medicine - XARELTO® (Rivaroxaban) ° °This medication  education was reviewed with me or my healthcare representative as part of my discharge preparation.  The pharmacist that spoke with me during my hospital stay was:  Absher, Randall K, RPH ° °Why was Xarelto® prescribed for you? °Xarelto® was prescribed for you to reduce the risk of blood clots forming after orthopedic surgery. The medical term for these abnormal blood clots is venous thromboembolism (VTE). ° °What do you need to know about xarelto® ? °Take your Xarelto® ONCE DAILY at the same time every day. °You may take it either with or without food. ° °If you have difficulty swallowing the tablet whole, you may crush it and mix in applesauce just prior to taking your dose. ° °Take Xarelto® exactly as prescribed by your doctor and DO NOT stop taking Xarelto® without talking to the doctor who prescribed the medication.  Stopping without other VTE prevention medication to take the place of Xarelto® may increase your risk of developing a clot. ° °After discharge, you should have regular check-up appointments with your healthcare provider that is   prescribing your Xarelto®.   ° °What do you do if you miss a dose? °If you miss a dose, take it as soon as you remember on the same day then continue your regularly scheduled once daily regimen the next day. Do not take two doses of Xarelto® on the same day.  ° °Important Safety Information °A possible side effect of Xarelto® is bleeding. You should call your healthcare provider right away if you experience any of the following: °  Bleeding from an injury or your nose that does not stop. °  Unusual colored urine (red or dark brown) or unusual colored stools (red or black). °  Unusual bruising for unknown reasons. °  A serious fall or if you hit your head (even if there is no bleeding). ° °Some medicines may interact with Xarelto® and might increase your risk of bleeding while on Xarelto®. To help avoid this, consult your healthcare provider or pharmacist prior to using any  new prescription or non-prescription medications, including herbals, vitamins, non-steroidal anti-inflammatory drugs (NSAIDs) and supplements. ° °This website has more information on Xarelto®: www.xarelto.com. ° ° °

## 2013-05-02 NOTE — Progress Notes (Signed)
   CARE MANAGEMENT NOTE 05/02/2013  Patient:  ARSHIA, SPELLMAN   Account Number:  [id number]  Date Initiated:  05/02/2013  Documentation initiated by:  [facility]  Subjective/Objective Assessment:   LEFT TOTAL KNEE ARTHROPLASTY     Action/Plan:   [place]   Anticipated DC Date:  05/03/2013   Anticipated DC Plan:  [place]  CM consult      [facility] Choice  HOME HEALTH   Choice offered to / List presented to:  C-1 Patient   DME arranged  3-N-1      DME agency  [place] arranged  [place].   Status of service:  Completed, signed off Medicare Important Message given?   (If response is "NO", the following Medicare IM given date fields will be blank) Date Medicare IM given:   Date Additional Medicare IM given:    Discharge Disposition:  [place]  Per UR Regulation:    If discussed at Long Length of Stay Meetings, dates discussed:    Comments:  05/02/2013 1000 NCM spoke to pt and gave permission to speak with brother. She will stay with her brother in [place], [place]. Requested [facility]. Requesting 3n1 for home. She has RW at home. Notified [facility] for DME and HH. Jonnie Finner RN CCM Case Mgmt phone [phone]

## 2013-05-02 NOTE — Progress Notes (Signed)
CSW met wit pt/daughter to assist with d/c planning. Pt has declined SNF and is planning to d/c to her brother's home when stable. RNCM is assisting with d/c planning needs. Pt/daughter have CSW cell # and will contact CSW if plan changes and rehab is needed.    LCSW [phone] 

## 2013-05-02 NOTE — Progress Notes (Signed)
Subjective: 1 Day Post-Op Procedure(s) (LRB): LEFT TOTAL KNEE ARTHROPLASTY (Left) Patient reports pain as moderate.   Patient seen in rounds with Dr. Wynelle Link. On e of the daughters is in the room at bedside. Patient is having problems with pain in the knee, requiring pain medications.  Encouraged to use the oral pain meds today for good pain control.  Will get up with therapy today and see how she does.  She wants to go home with her brother that lives in [place], [place]. We will start therapy today.  Plan is to go Home after hospital stay.  Objective: Vital signs in last 24 hours: Temp:  [97.3 F (36.3 C)-99.2 F (37.3 C)] 97.6 F (36.4 C) (03/03 0520) Pulse Rate:  [61-80] 66 (03/03 0520) Resp:  [12-18] 16 (03/03 0520) BP: (110-160)/(47-68) 160/63 mmHg (03/03 0520) SpO2:  [95 %-100 %] 97 % (03/03 0520) Weight:  [65.318 kg (144 lb)] 65.318 kg (144 lb) (03/02 1139)  Intake/Output from previous day:  Intake/Output Summary (Last 24 hours) at 05/02/13 0854 Last data filed at 05/02/13 0534  Gross per 24 hour  Intake   3925 ml  Output   2745 ml  Net   1180 ml    Intake/Output this shift: UOP 900 since MN +1180  Labs:  Recent Labs  05/02/13 0430  HGB 9.2*    Recent Labs  05/02/13 0430  WBC 8.3  RBC 3.08*  HCT 26.7*  PLT 185    Recent Labs  05/02/13 0430  NA 139  K 4.6  CL 103  CO2 22  BUN 14  CREATININE 0.80  GLUCOSE 175*  CALCIUM 9.1   No results found for this basename: LABPT, INR,  in the last 72 hours  EXAM General - Patient is Alert and Appropriate Extremity - Neurovascular intact Sensation intact distally Dressing - dressing C/D/I Motor Function - intact, moving foot and toes well on exam.  Hemovac pulled without difficulty.  Past Medical History  Diagnosis Date  . History of nuclear stress test 11/2009    dipyridamole; no evidence of ischemia, normal, low risk   . Dyslipidemia   . Breast cancer 1997    right - tx with lumpectomty & radiation   . Hearing impairment     BILATERAL HEARING AIDS  . History of shingles 1998    NO RESIDUAL PROBLEMS  . Sinus problem   . Shortness of breath     WHEN CLIMBING STEPS AT MY HOME--OTHERWISE OK  . GERD (gastroesophageal reflux disease)     RARE - NO MEDICATIONS  . Lymphedema of arm     RIGHT   . OA (osteoarthritis)     OA AND PAIN LEFT KNEE  . Diverticulosis   . Sleep difficulties     ALWAYS HAD SLEEPING PROBLEMS - MEDICATION HELPS PT SLEEP ABOUT 3 HOURS  . Hypertension     SEES CARDIOLOGIST DR. DAVID HARDING FOR HYPERTENSION     Assessment/Plan: 1 Day Post-Op Procedure(s) (LRB): LEFT TOTAL KNEE ARTHROPLASTY (Left) Principal Problem:   OA (osteoarthritis) of knee Active Problems:   Hypertension - Lotrel on hold due to the ACE-I, Lasix resumed.   Dyslipidemia - back on Zocor   GERD (gastroesophageal reflux disease) - no RX meds   Diverticulosis of colon (without mention of hemorrhage) - no complaints at this time.  Estimated body mass index is 28.12 kg/(m^2) as calculated from the following:   Height as of this encounter: 5' (1.524 m).   Weight as of this  encounter: 65.318 kg (144 lb). Advance diet Up with therapy Discharge home with home health if she does well and meets goals.  Otherwise, may look into SNF if needed,.  DVT Prophylaxis - Xarelto Weight-Bearing as tolerated to left leg D/C O2 and Pulse OX and try on Room [address]  Mickel Crow 05/02/2013, 8:54 AM

## 2013-05-02 NOTE — Evaluation (Signed)
Occupational Therapy Evaluation Patient Details Name: [name] Morton MRN: [id number] DOB: March 17, 1926 Today's Date: 05/02/2013 Time: 0865-7846 OT Time Calculation (min): 40 min  OT Assessment / Plan / Recommendation History of present illness LTKA   Clinical Impression   Pt was admitted for the above surgery.  She will benefit from skilled OT to increase safety and independence with adls and bathroom transfers.  Goals in acute are for min guard level--pt states she passes out easily.  Pt hopes to return home to brother's house after acute stay.  Will follow and continue to make recommendations.  Of note, pt has lymphedema in RUE.  She would benefit from OP lymphedema program once knee rehab is finished.  Gave information to pt/family.    OT Assessment  Patient needs continued OT Services    Follow Up Recommendations  Supervision/Assistance - 24 hour;SNF (vs)    Barriers to Discharge      Equipment Recommendations  3 in 1 bedside comode    Recommendations for Other Services    Frequency  Min 2X/week    Precautions / Restrictions Precautions Precautions: Fall;Knee Precaution Comments: will easily "pass out",  Required Braces or Orthoses: Knee Immobilizer - Left Restrictions Weight Bearing Restrictions: No   Pertinent Vitals/Pain 6/10 L knee; repositioned and ice applied    ADL  Grooming: Set up (uses LUE on L side of head for hair brushing) Where Assessed - Grooming: Unsupported sitting Upper Body Bathing: Set up Where Assessed - Upper Body Bathing: Unsupported sitting Lower Body Bathing: Minimal assistance Where Assessed - Lower Body Bathing: Supported sit to stand Upper Body Dressing: Minimal assistance Where Assessed - Upper Body Dressing: Unsupported sitting Lower Body Dressing: Moderate assistance Where Assessed - Lower Body Dressing: Supported sit to stand Toilet Transfer: Minimal assistance Toilet Transfer Method: Stand pivot Toileting - Clothing Manipulation  and Hygiene: Minimal assistance Where Assessed - Toileting Clothing Manipulation and Hygiene: Sit to stand from 3-in-1 or toilet Equipment Used: Reacher;Rolling walker Transfers/Ambulation Related to ADLs: spt to recliner. ADL Comments: Educated on KI and use of reacher for adls    OT Diagnosis: Generalized weakness  OT Problem List: Decreased strength;Decreased activity tolerance;Decreased knowledge of use of DME or AE;Pain OT Treatment Interventions: Self-care/ADL training;DME and/or AE instruction;Patient/family education;Therapeutic exercise   OT Goals(Current goals can be found in the care plan section) Acute Rehab OT Goals Patient Stated Goal: go to brother's to recover OT Goal Formulation: With patient Time For Goal Achievement: 05/09/13 Potential to Achieve Goals: Good ADL Goals Pt Will Perform Grooming: standing;with min guard assist Pt Will Transfer to Toilet: with min guard assist;ambulating;bedside commode Pt Will Perform Toileting - Clothing Manipulation and hygiene: with min guard assist;sit to/from stand Pt Will Perform Tub/Shower Transfer: with min guard assist;ambulating;Shower transfer Pt/caregiver will Perform Home Exercise Program: Increased ROM;Increased strength;Right Upper extremity;With theraputty;With Supervision;With written HEP provided Additional ADL Goal #1: pt will use reacher for LB adls with set up/supervision  Visit Information  Last OT Received On: 05/02/13 Assistance Needed: +2 History of Present Illness: LTKA       Prior [place] expects to be discharged to:: Private residence Living Arrangements: Other relatives Available Help at Discharge: Family Home Equipment: Gilford Rile - 2 wheels Additional Comments: will stay with brother and sister in law.  Needs 3;1 commode Prior Function Level of Independence: Independent Communication Communication: No difficulties Dominant Hand: Right          Vision/Perception     Cognition  Cognition Arousal/Alertness: Awake/alert Behavior During Therapy: WFL for tasks assessed/performed Overall Cognitive Status: Within Functional Limits for tasks assessed    Extremity/Trunk Assessment Upper Extremity Assessment Upper Extremity Assessment: RUE deficits/detail RUE Deficits / Details: s/p shoulder surgery.  Able to lift to 90 but uses traps and has difficulty with external rotation.  Hand strength grossly 4/5     Mobility Bed Mobility Sit to supine: Min assist;HOB elevated General bed mobility comments: assist for LLE Transfers Transfers: Sit to/from Stand Sit to Stand: Min assist General transfer comment: from bed; cues for UE/LE placement     Exercise Other Exercises Other Exercises: educated on scapula retraction and depression:  pt tends to use traps with RUE shoulder movement.  Also educated on gravity eliminated ER--will provide written sheets   Balance General Comments General comments (skin integrity, edema, etc.): Pt has lymphedema in RUE.  Daughter came in during eval and asked about exercises.  Pt has a compression sleeve which doesn't fit.  Gave them information about OP lymphedema program as pt needs to concentrate on knee rehab at this time.  Will provide simple program for scapula and gravity eliminated external rotation as well as theraputty.  Pt really needs to devote time to knee.  Cued her on using incentive spirometer when I entered   End of Session OT - End of Session Activity Tolerance: Patient tolerated treatment well Patient left: in chair;with call bell/phone within reach;with family/visitor present  [place] 05/02/2013, 11:02 AM Lesle Chris, OTR/L [phone] 05/02/2013

## 2013-05-02 NOTE — Progress Notes (Signed)
Physical Therapy Treatment Patient Details Name: [name] Morton MRN: [id number] DOB: 1926-10-21 Today's Date: 05/02/2013 Time: 1415-1440 PT Time Calculation (min): 25 min  PT Assessment / Plan / Recommendation  History of Present Illness LTKA   PT Comments   POD # 1 pm session.  Applied KI then assisted pt OOB to amb second time.  Pt required increased time to minimize any anxiety and help control pain level.  Assisted back to bed for CPM.   Follow Up Recommendations  Home health PT (Brother's home)     Does the patient have the potential to tolerate intense rehabilitation     Barriers to Discharge        Equipment Recommendations  None recommended by PT    Recommendations for Other Services    Frequency 7X/week   Progress towards PT Goals Progress towards PT goals: Progressing toward goals  Plan      Precautions / Restrictions Precautions Precautions: Fall;Knee Precaution Comments: instructed pt on KI use for amvb Required Braces or Orthoses: Knee Immobilizer - Left Restrictions Weight Bearing Restrictions: No Other Position/Activity Restrictions: WBAT   Pertinent Vitals/Pain C/o 6/10 pain ICE applied    Mobility  Bed Mobility Overal bed mobility: + 2 for safety/equipment;Needs Assistance Bed Mobility: Supine to Sit;Sit to Supine Supine to sit: Min assist Sit to supine: Min assist General bed mobility comments: min assist to support L LE and increased time Transfers Overall transfer level: Needs assistance Equipment used: Rolling walker (2 wheeled) Transfers: Sit to/from Stand Sit to Stand: Min assist;Min guard General transfer comment: 25% VC's on proper tech and hand placement plus increased time Ambulation/Gait Ambulation/Gait assistance: Min guard;Min assist Ambulation Distance (Feet): 45 Feet Assistive device: Rolling walker (2 wheeled) Gait Pattern/deviations: Step-to pattern;Decreased stance time - left Gait velocity: decreased General Gait  Details: 25% VC's on proper sequencing and proper walker to selrf distance.  Also required increased time.    PT Diagnosis:    PT Problem List:   PT Treatment Interventions:     PT Goals (current goals can now be found in the care plan section)    Visit Information  Last PT Received On: 05/02/13 Assistance Needed: +1 History of Present Illness: LTKA    Subjective Data      Cognition  Cognition Arousal/Alertness: Awake/alert Behavior During Therapy: WFL for tasks assessed/performed Overall Cognitive Status: Within Functional Limits for tasks assessed    Balance     End of Session PT - End of Session Equipment Utilized During Treatment: Left knee immobilizer Activity Tolerance: Patient limited by fatigue Patient left: in bed;with call bell/phone within reach;with family/visitor present CPM Left Knee CPM Left Knee: On   Rica Koyanagi  PTA [facility]  Acute  Rehab Pager      [phone]

## 2013-05-02 NOTE — Progress Notes (Signed)
   CARE MANAGEMENT NOTE 05/02/2013  Patient:  [name] Morton, [name] Morton   Account Number:  [id number]  Date Initiated:  05/02/2013  Documentation initiated by:  [facility]  Subjective/Objective Assessment:   LEFT TOTAL KNEE ARTHROPLASTY     Action/Plan:   [place]   Anticipated DC Date:  05/03/2013   Anticipated DC Plan:  [place]  CM consult      [facility] Choice  HOME HEALTH   Choice offered to / List presented to:  C-1 Patient   DME arranged  3-N-1      DME agency  [place] arranged  [place].   Status of service:  Completed, signed off Medicare Important Message given?   (If response is "NO", the following Medicare IM given date fields will be blank) Date Medicare IM given:   Date Additional Medicare IM given:    Discharge Disposition:  [place]  Per UR Regulation:    If discussed at Long Length of Stay Meetings, dates discussed:    Comments:  05/02/2013 1000 NCM spoke to pt and gave permission to speak with brother. She will stay with her brother in [place], [place]. Requested [facility] for [facility]. Requesting 3n1 for home. She has RW at home. Notified [facility] for DME and HH. [name] Finner RN CCM Case Mgmt phone [phone]   [name] Morton, dtr # [phone] [name] Morton, brother # [phone] # [phone]

## 2013-05-02 NOTE — Progress Notes (Signed)
Occupational Therapy Treatment Patient Details Name: [name] Morton MRN: [id number] DOB: 12-09-1926 Today's Date: 05/02/2013 Time: 6578-4696 OT Time Calculation (min): 24 min  OT Assessment / Plan / Recommendation  History of present illness LTKA   OT comments  *making good progress.  Pt controlled descent much better.  Educated on a couple of shoulder exercises and theraputty for hand strengthening, but told pt that if she wasn't able to keep up with these as well as knee, she could postpone these until after knee recovers.  Gave daughter a referral form for lymphedema program.    Follow Up Recommendations  Supervision/Assistance - 24 hour (likely no HHOT)    Barriers to Discharge       Equipment Recommendations  3 in 1 bedside comode    Recommendations for Other Services    Frequency     Progress towards OT Goals Progress towards OT goals: Progressing toward goals  Plan      Precautions / Restrictions Precautions Precautions: Fall;Knee Precaution Comments: instructed pt on KI use for amvb Required Braces or Orthoses: Knee Immobilizer - Left Restrictions Weight Bearing Restrictions: No Other Position/Activity Restrictions: WBAT   Pertinent Vitals/Pain No c/o pain.  Repositioned and removed ice    ADL  Toilet Transfer: Minimal assistance Toilet Transfer Method: Stand pivot Toilet Transfer Equipment: Bedside commode Toileting - Clothing Manipulation and Hygiene: Min guard Where Assessed - Toileting Clothing Manipulation and Hygiene: Sit to stand from 3-in-1 or toilet Transfers/Ambulation Related to ADLs: bed mobility min A    OT Diagnosis:    OT Problem List:   OT Treatment Interventions:     OT Goals(current goals can now be found in the care plan section) ADL Goals Pt Will Perform Grooming: standing;with min guard assist Pt Will Transfer to Toilet: with min guard assist;ambulating;bedside commode Pt Will Perform Toileting - Clothing Manipulation and hygiene:  with min guard assist;sit to/from stand Pt Will Perform Tub/Shower Transfer: with min guard assist;ambulating;Shower transfer Pt/caregiver will Perform Home Exercise Program: Increased ROM;Increased strength;Right Upper extremity;With theraputty;With Supervision;With written HEP provided Additional ADL Goal #1: pt will use reacher for LB adls with set up/supervision  Visit Information  Last OT Received On: 05/02/13 Assistance Needed: +1 History of Present Illness: LTKA    Subjective Data      Prior Functioning       Cognition  Cognition Arousal/Alertness: Awake/alert Behavior During Therapy: WFL for tasks assessed/performed Overall Cognitive Status: Within Functional Limits for tasks assessed    Mobility  Bed Mobility Sit to supine: Min assist;HOB elevated  Transfers Overall transfer level: Needs assistance Equipment used: Rolling walker (2 wheeled) Transfers: Sit to/from Stand Sit to Stand: Min assist General transfer comment: vcs for hand and LE placement   Exercises  Other Exercises Other Exercises: brought written HEP.  Pt able to return demonstrate scapula retraction and gravity eliminated IR/ER.  Needs cues for hand strengthening with putty. Working on full flexion, tip pinches and pulling up putty for intrinsics/lumbricals   Balance    End of Session OT - End of Session Activity Tolerance: Patient tolerated treatment well Patient left: in chair;with call bell/phone within reach;with family/visitor present  [place] 05/02/2013, 2:21 PM Lesle Chris, OTR/L [phone] 05/02/2013

## 2013-05-03 LAB — CBC
HCT: 25.9 % — ABNORMAL LOW (ref 36.0–46.0)
Hemoglobin: 9 g/dL — ABNORMAL LOW (ref 12.0–15.0)
MCH: 30.4 pg (ref 26.0–34.0)
MCHC: 34.7 g/dL (ref 30.0–36.0)
MCV: 87.5 fL (ref 78.0–100.0)
PLATELETS: 200 10*3/uL (ref 150–400)
RBC: 2.96 MIL/uL — AB (ref 3.87–5.11)
RDW: 13.4 % (ref 11.5–15.5)
WBC: 11.4 10*3/uL — AB (ref 4.0–10.5)

## 2013-05-03 LAB — BASIC METABOLIC PANEL
BUN: 19 mg/dL (ref 6–23)
CHLORIDE: 100 meq/L (ref 96–112)
CO2: 27 meq/L (ref 19–32)
CREATININE: 0.92 mg/dL (ref 0.50–1.10)
Calcium: 9.2 mg/dL (ref 8.4–10.5)
GFR calc Af Amer: 64 mL/min — ABNORMAL LOW (ref >90)
GFR calc non Af Amer: 56 mL/min — ABNORMAL LOW (ref >90)
Glucose, Bld: 156 mg/dL — ABNORMAL HIGH (ref 70–99)
POTASSIUM: 4.4 meq/L (ref 3.7–5.3)
SODIUM: 139 meq/L (ref 137–147)

## 2013-05-03 NOTE — Progress Notes (Signed)
Physical Therapy Treatment Patient Details Name: Ariadne Rissmiller MRN: [id number] DOB: 1926/03/05 Today's Date: 05/03/2013 Time: 2992-4268 PT Time Calculation (min): 13 min  PT Assessment / Plan / Recommendation  History of Present Illness LTKA   PT Comments   POD # 2 deferred afternoon amb due to MAX c/o headache and fatigue as pt just came from [place].  Asssited from recliner back to bed.  Required increased time to posistion to comfort.   Follow Up Recommendations  Home health PT     Does the patient have the potential to tolerate intense rehabilitation     Barriers to Discharge        Equipment Recommendations       Recommendations for Other Services    Frequency     Progress towards PT Goals Progress towards PT goals: Progressing toward goals  Plan      Precautions / Restrictions Precautions Precautions: Fall;Knee Precaution Comments: instructed pt on KI use for stairs - no longers needs for amb due to active SLR Required Braces or Orthoses: Knee Immobilizer - Left Restrictions Weight Bearing Restrictions: No Other Position/Activity Restrictions: WBAT   Pertinent Vitals/Pain     Mobility  Bed Mobility Overal bed mobility: Needs Assistance Bed Mobility: Sit to Supine Sit to supine: Min guard General bed mobility comments: increased time to position self back to bed Transfers Overall transfer level: Needs assistance Equipment used: Rolling walker (2 wheeled) Transfers: Sit to/from Stand Sit to Stand: Supervision General transfer comment: one cue for stand to sit for LLE Ambulation/Gait Ambulation/Gait assistance: Supervision Ambulation Distance (Feet): 80 Feet Assistive device: Rolling walker (2 wheeled) Gait Pattern/deviations: Step-to pattern;Decreased stance time - left Gait velocity: deferred amb due to MAX c/o headache General Gait Details: 25% VC's on proper sequencing and proper walker to selrf distance.  Also required increased time. Stairs: Yes Number  of Stairs: 1 General stair comments: performed 3 times last time with brother who is taking pt home.  Instructed on importance of wearing KI for support and proper seqeuncing/safety.        PT Goals (current goals can now be found in the care plan section)    Visit Information  Last PT Received On: 05/03/13 Assistance Needed: +1 History of Present Illness: LTKA    Subjective Data      Cognition       Balance     End of Session PT - End of Session Equipment Utilized During Treatment: Gait belt Activity Tolerance: Patient tolerated treatment well Patient left: in bed;with call bell/phone within reach   Rica Koyanagi  PTA [facility]  Acute  Rehab Pager      [phone]

## 2013-05-03 NOTE — Progress Notes (Signed)
Physical Therapy Treatment Patient Details Name: [name] Morton MRN: [id number] DOB: Sep 11, 1926 Today's Date: 05/03/2013 Time:  -     PT Assessment / Plan / Recommendation  History of Present Illness LTKA   PT Comments   POD # 2 assisted pt OOB to amb in hallway, practiced negotiating one step forward with RW.  Performed all supine TKR TE's following handout.  Brother present for family education on safe handling tech, negotiating one step, KI use and proper application and HEP followed by use of ICE.   Follow Up Recommendations  Home health PT     Does the patient have the potential to tolerate intense rehabilitation     Barriers to Discharge        Equipment Recommendations       Recommendations for Other Services    Frequency     Progress towards PT Goals    Plan      Precautions / Restrictions Precautions Precautions: Fall;Knee Precaution Comments: instructed pt on KI use for amvb Required Braces or Orthoses: Knee Immobilizer - Left Restrictions Weight Bearing Restrictions: No Other Position/Activity Restrictions: WBAT   Pertinent Vitals/Pain C/o "soreness" ICE applied    Mobility  Bed Mobility General bed mobility comments: Pt  OOB in recliner Transfers Overall transfer level: Needs assistance Equipment used: Rolling walker (2 wheeled) Transfers: Sit to/from Stand Sit to Stand: Supervision General transfer comment: one cue for stand to sit for LLE Ambulation/Gait Ambulation/Gait assistance: Supervision Ambulation Distance (Feet): 80 Feet Assistive device: Rolling walker (2 wheeled) Gait Pattern/deviations: Step-to pattern;Decreased stance time - left Gait velocity: decreased General Gait Details: 25% VC's on proper sequencing and proper walker to selrf distance.  Also required increased time. Stairs: Yes Number of Stairs: 1 General stair comments: performed 3 times last time with brother who is taking pt home.  Instructed on importance of wearing KI  for support and proper seqeuncing/safety.      Exercises   Total Knee Replacement TE's 10 reps B LE ankle pumps 10 reps towel squeezes 10 reps knee presses 10 reps heel slides  10 reps SAQ's 10 reps SLR's 10 reps ABD Followed by ICE   .PT Goals (current goals can now be found in the care plan section)    Visit Information  Last PT Received On: 05/03/13 Assistance Needed: +1 History of Present Illness: LTKA    Subjective Data      Cognition  Cognition Arousal/Alertness: Awake/alert Behavior During Therapy: WFL for tasks assessed/performed Overall Cognitive Status: Within Functional Limits for tasks assessed    Balance     End of Session PT - End of Session Equipment Utilized During Treatment: Left knee immobilizer;Gait belt Activity Tolerance: Patient tolerated treatment well Patient left: in chair;with call bell/phone within reach;with family/visitor present   Rica Koyanagi  PTA [facility]  Acute  Rehab Pager      [phone]

## 2013-05-03 NOTE — Progress Notes (Signed)
Occupational Therapy Treatment Patient Details Name: Shanoah Asbill MRN: [id number] DOB: 1926-08-14 Today's Date: 05/03/2013 Time: 0321-2248 OT Time Calculation (min): 16 min  OT Assessment / Plan / Recommendation  History of present illness LTKA   OT comments  Making good progress in OT.    Follow Up Recommendations  Supervision/Assistance - 24 hour    Barriers to Discharge       Equipment Recommendations  3 in 1 bedside comode    Recommendations for Other Services    Frequency     Progress towards OT Goals Progress towards OT goals: Progressing toward goals  Plan      Precautions / Restrictions Precautions Precautions: Fall;Knee Required Braces or Orthoses: Knee Immobilizer - Left Restrictions Weight Bearing Restrictions: No Other Position/Activity Restrictions: WBAT   Pertinent Vitals/Pain LLE a little sore but feels pretty good per pt.  Repositioned with ice    ADL  Toilet Transfer: Minimal assistance (to support LLE when sitting) Toilet Transfer Method: Stand pivot Tub/Shower Transfer: Min guard Tub/Shower Transfer Method: Therapist, art: Walk in shower Transfers/Ambulation Related to ADLs: ambulated with min guard.  No cues for sequencing needed.  1 cue for stand to sit to position LLE ADL Comments: Brother present and reviewed shower transfer.  He will have her sponge bathe at sink with 3:1 initially for safety.  Shower stall is very small    OT Diagnosis:    OT Problem List:   OT Treatment Interventions:     OT Goals(current goals can now be found in the care plan section)    Visit Information  Last OT Received On: 05/03/13 Assistance Needed: +1 History of Present Illness: LTKA    Subjective Data      Prior Functioning       Cognition  Cognition Arousal/Alertness: Awake/alert Behavior During Therapy: WFL for tasks assessed/performed Overall Cognitive Status: Within Functional Limits for tasks assessed     Mobility  Transfers Transfers: Sit to/from Stand Sit to Stand: Min guard General transfer comment: one cue for stand to sit for LLE    Exercises      Balance    End of Session OT - End of Session Activity Tolerance: Patient tolerated treatment well Patient left: in chair;with call bell/phone within reach;with family/visitor present CPM Left Knee CPM Left Knee: Off  GO     Troi Florendo 05/03/2013, 10:49 AM Lesle Chris, OTR/L [phone] 05/03/2013

## 2013-05-03 NOTE — Anesthesia Postprocedure Evaluation (Signed)
Anesthesia Post Note  Patient: [name] Morton  Procedure(s) Performed: Procedure(s) (LRB): LEFT TOTAL KNEE ARTHROPLASTY (Left)  Anesthesia type: General  Patient location: PACU  Post pain: Pain level controlled  Post assessment: Post-op Vital signs reviewed  Last Vitals:  Filed Vitals:   05/03/13 1014  BP: 137/68  Pulse: 85  Temp:   Resp:     Post vital signs: Reviewed  Level of consciousness: sedated  Complications: No apparent anesthesia complications

## 2013-05-04 LAB — CBC
HEMATOCRIT: 23.1 % — AB (ref 36.0–46.0)
Hemoglobin: 8 g/dL — ABNORMAL LOW (ref 12.0–15.0)
MCH: 30.4 pg (ref 26.0–34.0)
MCHC: 34.6 g/dL (ref 30.0–36.0)
MCV: 87.8 fL (ref 78.0–100.0)
Platelets: 175 10*3/uL (ref 150–400)
RBC: 2.63 MIL/uL — AB (ref 3.87–5.11)
RDW: 13.6 % (ref 11.5–15.5)
WBC: 9.7 10*3/uL (ref 4.0–10.5)

## 2013-05-04 MED ORDER — METHOCARBAMOL 500 MG PO TABS: 500.0000 mg | ORAL_TABLET | Freq: Four times a day (QID) | ORAL | Status: DC | PRN

## 2013-05-04 MED ORDER — POLYSACCHARIDE IRON COMPLEX 150 MG PO CAPS
150.0000 mg | ORAL_CAPSULE | Freq: Two times a day (BID) | ORAL | Status: DC
  Administered 2013-05-04: 150 mg via ORAL
  Filled 2013-05-04 (×2): qty 1

## 2013-05-04 MED ORDER — POLYSACCHARIDE IRON COMPLEX 150 MG PO CAPS: 150.0000 mg | ORAL_CAPSULE | Freq: Two times a day (BID) | ORAL | Status: DC

## 2013-05-04 MED ORDER — RIVAROXABAN 10 MG PO TABS: 10.0000 mg | ORAL_TABLET | Freq: Every day | ORAL | Status: DC

## 2013-05-04 MED ORDER — OXYCODONE HCL 5 MG PO TABS: 5.0000 mg | ORAL_TABLET | ORAL | Status: DC | PRN

## 2013-05-04 MED ORDER — TRAMADOL HCL 50 MG PO TABS: 50.0000 mg | ORAL_TABLET | Freq: Four times a day (QID) | ORAL | Status: DC | PRN

## 2013-05-04 NOTE — Progress Notes (Addendum)
LATE ENTRY NOTE Date of Service of Visit - 05/03/2013    Subjective: 2 Days Post-Op Procedure(s) (LRB): LEFT TOTAL KNEE ARTHROPLASTY (Left) Patient reports pain as mild.   Patient seen in rounds for Dr. Wynelle Link. Patient is well, but has had some minor complaints of pain in the knee, requiring pain medications Plan is to go Home after hospital stay.  Objective: Vital signs in last 24 hours: Temp:  [98 F (36.7 C)-98.8 F (37.1 C)] 98.8 F (37.1 C) (03/05 0450) Pulse Rate:  [67-85] 71 (03/05 0450) Resp:  [16-18] 16 (03/05 0450) BP: (120-137)/(61-68) 133/64 mmHg (03/05 0450) SpO2:  [77 %-100 %] 97 % (03/05 0450)  Intake/Output from previous day:  Intake/Output Summary (Last 24 hours) at 05/04/13 0939 Last data filed at 05/04/13 0800  Gross per 24 hour  Intake    240 ml  Output   1800 ml  Net  -1560 ml    Intake/Output this shift: Total I/O In: 240 [P.O.:240] Out: -   Labs:  Recent Labs  05/02/13 0430 05/03/13 0355 05/04/13 0448  HGB 9.2* 9.0* 8.0*    Recent Labs  05/03/13 0355 05/04/13 0448  WBC 11.4* 9.7  RBC 2.96* 2.63*  HCT 25.9* 23.1*  PLT 200 175    Recent Labs  05/02/13 0430 05/03/13 0355  NA 139 139  K 4.6 4.4  CL 103 100  CO2 22 27  BUN 14 19  CREATININE 0.80 0.92  GLUCOSE 175* 156*  CALCIUM 9.1 9.2   No results found for this basename: LABPT, INR,  in the last 72 hours  EXAM General - Patient is Alert, Appropriate and Oriented Extremity - Neurovascular intact Sensation intact distally Dorsiflexion/Plantar flexion intact Dressing/Incision - clean, dry, no drainage Motor Function - intact, moving foot and toes well on exam.   Past Medical History  Diagnosis Date  . History of nuclear stress test 11/2009    dipyridamole; no evidence of ischemia, normal, low risk   . Dyslipidemia   . Breast cancer 1997    right - tx with lumpectomty & radiation  . Hearing impairment     BILATERAL HEARING AIDS  . History of shingles 1998    NO  RESIDUAL PROBLEMS  . Sinus problem   . Shortness of breath     WHEN CLIMBING STEPS AT MY HOME--OTHERWISE OK  . GERD (gastroesophageal reflux disease)     RARE - NO MEDICATIONS  . Lymphedema of arm     RIGHT   . OA (osteoarthritis)     OA AND PAIN LEFT KNEE  . Diverticulosis   . Sleep difficulties     ALWAYS HAD SLEEPING PROBLEMS - MEDICATION HELPS PT SLEEP ABOUT 3 HOURS  . Hypertension     SEES CARDIOLOGIST DR. DAVID HARDING FOR HYPERTENSION     Assessment/Plan: 2 Days Post-Op Procedure(s) (LRB): LEFT TOTAL KNEE ARTHROPLASTY (Left) Principal Problem:   OA (osteoarthritis) of knee Active Problems:   Hypertension   Dyslipidemia   GERD (gastroesophageal reflux disease)   Diverticulosis of colon (without mention of hemorrhage)  Estimated body mass index is 28.12 kg/(m^2) as calculated from the following:   Height as of this encounter: 5' (1.524 m).   Weight as of this encounter: 65.318 kg (144 lb). Up with therapy Plan for discharge tomorrow Discharge home with home health  DVT Prophylaxis - Xarelto Weight-Bearing as tolerated to left leg  PERKINS, ALEXZANDREW 05/04/2013, 9:39 AM

## 2013-05-04 NOTE — Progress Notes (Signed)
Physical Therapy Treatment Patient Details Name: [name] Morton MRN: [id number] DOB: October 17, 1926 Today's Date: 05/04/2013 Time: 0912-0940 PT Time Calculation (min): 28 min  PT Assessment / Plan / Recommendation  History of Present Illness LTKA   PT Comments   POD # 3 pt plans to D/C to home today.  Amb in hallway, assisted to BR then practiced negotiating one step. Pt plans to D/C to home today.    Follow Up Recommendations  Home health PT     Does the patient have the potential to tolerate intense rehabilitation     Barriers to Discharge        Equipment Recommendations       Recommendations for Other Services    Frequency 7X/week   Progress towards PT Goals Progress towards PT goals: Progressing toward goals  Plan      Precautions / Restrictions Precautions Precautions: Fall;Knee Precaution Comments: Pt no longer requires use of KI Restrictions Weight Bearing Restrictions: No Other Position/Activity Restrictions: WBAT   Pertinent Vitals/Pain     Mobility  Bed Mobility General bed mobility comments: Pt OOB in recliner Transfers Overall transfer level: Needs assistance Equipment used: Rolling walker (2 wheeled) Transfers: Sit to/from Stand Sit to Stand: Supervision General transfer comment: good safety tech Ambulation/Gait Ambulation/Gait assistance: Supervision Ambulation Distance (Feet): 85 Feet Assistive device: Rolling walker (2 wheeled) Gait Pattern/deviations: Step-to pattern;Decreased stance time - left Gait velocity: slow but staedy General Gait Details: amb with brother using good safe handling and level of assist Stairs: Yes Stairs assistance: Min guard Stair Management: No rails Number of Stairs: 1 General stair comments: performed twice with brother and NO VC's.  Pt recalled proper sequencing and walker placement.     PT Goals (current goals can now be found in the care plan section)    Visit Information  Last PT Received On:  05/04/13 Assistance Needed: +1 History of Present Illness: LTKA    Subjective Data      Cognition       Balance     End of Session PT - End of Session Equipment Utilized During Treatment: Gait belt Activity Tolerance: Patient tolerated treatment well Patient left: in chair;with call bell/phone within reach   Rica Koyanagi  PTA [facility]  Acute  Rehab Pager      [phone]

## 2013-05-04 NOTE — Progress Notes (Signed)
Subjective: 3 Days Post-Op Procedure(s) (LRB): LEFT TOTAL KNEE ARTHROPLASTY (Left) Patient reports pain as mild.   Patient seen in rounds with Dr. Wynelle Link. Patient is well, and has had no acute complaints or problems Patient is ready to go home today.  She will be going to [place], [place] to her brother's house.  Objective: Vital signs in last 24 hours: Temp:  [98 F (36.7 C)-98.8 F (37.1 C)] 98.8 F (37.1 C) (03/05 0450) Pulse Rate:  [67-85] 71 (03/05 0450) Resp:  [16-18] 16 (03/05 0450) BP: (120-137)/(61-68) 133/64 mmHg (03/05 0450) SpO2:  [77 %-100 %] 97 % (03/05 0450)  Intake/Output from previous day:  Intake/Output Summary (Last 24 hours) at 05/04/13 0940 Last data filed at 05/04/13 0800  Gross per 24 hour  Intake    240 ml  Output   1800 ml  Net  -1560 ml    Intake/Output this shift: Total I/O In: 240 [P.O.:240] Out: -   Labs:  Recent Labs  05/02/13 0430 05/03/13 0355 05/04/13 0448  HGB 9.2* 9.0* 8.0*    Recent Labs  05/03/13 0355 05/04/13 0448  WBC 11.4* 9.7  RBC 2.96* 2.63*  HCT 25.9* 23.1*  PLT 200 175    Recent Labs  05/02/13 0430 05/03/13 0355  NA 139 139  K 4.6 4.4  CL 103 100  CO2 22 27  BUN 14 19  CREATININE 0.80 0.92  GLUCOSE 175* 156*  CALCIUM 9.1 9.2   No results found for this basename: LABPT, INR,  in the last 72 hours  EXAM: General - Patient is Alert, Appropriate and Oriented Extremity - Neurovascular intact Sensation intact distally Dorsiflexion/Plantar flexion intact Incision - clean, dry, no drainage, healing Motor Function - intact, moving foot and toes well on exam.   Assessment/Plan: 3 Days Post-Op Procedure(s) (LRB): LEFT TOTAL KNEE ARTHROPLASTY (Left) Procedure(s) (LRB): LEFT TOTAL KNEE ARTHROPLASTY (Left) Past Medical History  Diagnosis Date  . History of nuclear stress test 11/2009    dipyridamole; no evidence of ischemia, normal, low risk   . Dyslipidemia   . Breast cancer 1997    right - tx with  lumpectomty & radiation  . Hearing impairment     BILATERAL HEARING AIDS  . History of shingles 1998    NO RESIDUAL PROBLEMS  . Sinus problem   . Shortness of breath     WHEN CLIMBING STEPS AT MY HOME--OTHERWISE OK  . GERD (gastroesophageal reflux disease)     RARE - NO MEDICATIONS  . Lymphedema of arm     RIGHT   . OA (osteoarthritis)     OA AND PAIN LEFT KNEE  . Diverticulosis   . Sleep difficulties     ALWAYS HAD SLEEPING PROBLEMS - MEDICATION HELPS PT SLEEP ABOUT 3 HOURS  . Hypertension     SEES CARDIOLOGIST DR. DAVID HARDING FOR HYPERTENSION    Principal Problem:   OA (osteoarthritis) of knee Active Problems:   Hypertension   Dyslipidemia   GERD (gastroesophageal reflux disease)   Diverticulosis of colon (without mention of hemorrhage)  Estimated body mass index is 28.12 kg/(m^2) as calculated from the following:   Height as of this encounter: 5' (1.524 m).   Weight as of this encounter: 65.318 kg (144 lb). Up with therapy Discharge home with home health Diet - Cardiac diet Follow up - in 2 weeks Activity - WBAT Disposition - Home Condition Upon Discharge - Good D/C Meds - See DC Summary DVT Prophylaxis - Xarelto  PERKINS, ALEXZANDREW 05/04/2013,  9:40 AM

## 2013-05-04 NOTE — Discharge Summary (Signed)
Physician Discharge Summary   Patient ID: [name] Morton MRN: [id number] DOB/AGE: 78/14/1928 78 y.o.  Admit date: 05/01/2013 Discharge date: 05/04/2013  Primary Diagnosis:  Osteoarthritis Left knee(s)  Admission Diagnoses:  Past Medical History  Diagnosis Date  . History of nuclear stress test 11/2009    dipyridamole; no evidence of ischemia, normal, low risk   . Dyslipidemia   . Breast cancer 1997    right - tx with lumpectomty & radiation  . Hearing impairment     BILATERAL HEARING AIDS  . History of shingles 1998    NO RESIDUAL PROBLEMS  . Sinus problem   . Shortness of breath     WHEN CLIMBING STEPS AT MY HOME--OTHERWISE OK  . GERD (gastroesophageal reflux disease)     RARE - NO MEDICATIONS  . Lymphedema of arm     RIGHT   . OA (osteoarthritis)     OA AND PAIN LEFT KNEE  . Diverticulosis   . Sleep difficulties     ALWAYS HAD SLEEPING PROBLEMS - MEDICATION HELPS PT SLEEP ABOUT 3 HOURS  . Hypertension     SEES CARDIOLOGIST DR. DAVID HARDING FOR HYPERTENSION    Discharge Diagnoses:   Principal Problem:   OA (osteoarthritis) of knee Active Problems:   Hypertension   Dyslipidemia   GERD (gastroesophageal reflux disease)   Diverticulosis of colon (without mention of hemorrhage)  Estimated body mass index is 28.12 kg/(m^2) as calculated from the following:   Height as of this encounter: 5' (1.524 m).   Weight as of this encounter: 65.318 kg (144 lb).  Procedure:  Procedure(s) (LRB): LEFT TOTAL KNEE ARTHROPLASTY (Left)   Consults: None  HPI: [name] Morton is a 80 y.o. year old female with end stage OA of her left knee with progressively worsening pain and dysfunction. She has constant pain, with activity and at rest and significant functional deficits with difficulties even with ADLs. She has had extensive non-op management including analgesics, injections of cortisone and viscosupplements, and home exercise program, but remains in significant pain with  significant dysfunction. Radiographs show bone on bone arthritis medial and patellofemoral. She presents now for left Total Knee Arthroplasty.   Laboratory Data: Admission on 05/01/2013, Discharged on 05/04/2013  Component Date Value Ref Range Status  . WBC 05/02/2013 8.3  4.0 - 10.5 K/uL Final  . RBC 05/02/2013 3.08* 3.87 - 5.11 MIL/uL Final  . Hemoglobin 05/02/2013 9.2* 12.0 - 15.0 g/dL Final  . HCT 05/02/2013 26.7* 36.0 - 46.0 % Final  . MCV 05/02/2013 86.7  78.0 - 100.0 fL Final  . MCH 05/02/2013 29.9  26.0 - 34.0 pg Final  . MCHC 05/02/2013 34.5  30.0 - 36.0 g/dL Final  . RDW 05/02/2013 13.2  11.5 - 15.5 % Final  . Platelets 05/02/2013 185  150 - 400 K/uL Final  . Sodium 05/02/2013 139  137 - 147 mEq/L Final  . Potassium 05/02/2013 4.6  3.7 - 5.3 mEq/L Final  . Chloride 05/02/2013 103  96 - 112 mEq/L Final  . CO2 05/02/2013 22  19 - 32 mEq/L Final  . Glucose, Bld 05/02/2013 175* 70 - 99 mg/dL Final  . BUN 05/02/2013 14  6 - 23 mg/dL Final  . Creatinine, Ser 05/02/2013 0.80  0.50 - 1.10 mg/dL Final  . Calcium 05/02/2013 9.1  8.4 - 10.5 mg/dL Final  . GFR calc non Af Amer 05/02/2013 66* >90 mL/min Final  . GFR calc Af Amer 05/02/2013 76* >90 mL/min Final   Comment: (NOTE)  The eGFR has been calculated using the CKD EPI equation.                          This calculation has not been validated in all clinical situations.                          eGFR's persistently <90 mL/min signify possible Chronic Kidney                          Disease.  . WBC 05/03/2013 11.4* 4.0 - 10.5 K/uL Final  . RBC 05/03/2013 2.96* 3.87 - 5.11 MIL/uL Final  . Hemoglobin 05/03/2013 9.0* 12.0 - 15.0 g/dL Final  . HCT 05/03/2013 25.9* 36.0 - 46.0 % Final  . MCV 05/03/2013 87.5  78.0 - 100.0 fL Final  . MCH 05/03/2013 30.4  26.0 - 34.0 pg Final  . MCHC 05/03/2013 34.7  30.0 - 36.0 g/dL Final  . RDW 05/03/2013 13.4  11.5 - 15.5 % Final  . Platelets 05/03/2013 200  150 - 400 K/uL  Final  . Sodium 05/03/2013 139  137 - 147 mEq/L Final  . Potassium 05/03/2013 4.4  3.7 - 5.3 mEq/L Final  . Chloride 05/03/2013 100  96 - 112 mEq/L Final  . CO2 05/03/2013 27  19 - 32 mEq/L Final  . Glucose, Bld 05/03/2013 156* 70 - 99 mg/dL Final  . BUN 05/03/2013 19  6 - 23 mg/dL Final  . Creatinine, Ser 05/03/2013 0.92  0.50 - 1.10 mg/dL Final  . Calcium 05/03/2013 9.2  8.4 - 10.5 mg/dL Final  . GFR calc non Af Amer 05/03/2013 56* >90 mL/min Final  . GFR calc Af Amer 05/03/2013 64* >90 mL/min Final   Comment: (NOTE)                          The eGFR has been calculated using the CKD EPI equation.                          This calculation has not been validated in all clinical situations.                          eGFR's persistently <90 mL/min signify possible Chronic Kidney                          Disease.  . WBC 05/04/2013 9.7  4.0 - 10.5 K/uL Final  . RBC 05/04/2013 2.63* 3.87 - 5.11 MIL/uL Final  . Hemoglobin 05/04/2013 8.0* 12.0 - 15.0 g/dL Final  . HCT 05/04/2013 23.1* 36.0 - 46.0 % Final  . MCV 05/04/2013 87.8  78.0 - 100.0 fL Final  . MCH 05/04/2013 30.4  26.0 - 34.0 pg Final  . MCHC 05/04/2013 34.6  30.0 - 36.0 g/dL Final  . RDW 05/04/2013 13.6  11.5 - 15.5 % Final  . Platelets 05/04/2013 175  150 - 400 K/uL Final  Hospital Outpatient Visit on 04/25/2013  Component Date Value Ref Range Status  . MRSA, PCR 04/25/2013 NEGATIVE  NEGATIVE Final  . Staphylococcus aureus 04/25/2013 NEGATIVE  NEGATIVE Final   Comment:  The Xpert SA Assay (FDA                          approved for NASAL specimens                          in patients over 7 years of age),                          is one component of                          a comprehensive surveillance                          program.  Test performance has                          been validated by [employer] for patients greater                          than or  equal to 78 year old.                          It is not intended                          to diagnose infection nor to                          guide or monitor treatment.  [name] Kitchen aPTT 04/25/2013 33  24 - 37 seconds Final  . WBC 04/25/2013 6.7  4.0 - 10.5 K/uL Final  . RBC 04/25/2013 4.34  3.87 - 5.11 MIL/uL Final  . Hemoglobin 04/25/2013 12.9  12.0 - 15.0 g/dL Final  . HCT 04/25/2013 38.1  36.0 - 46.0 % Final  . MCV 04/25/2013 87.8  78.0 - 100.0 fL Final  . MCH 04/25/2013 29.7  26.0 - 34.0 pg Final  . MCHC 04/25/2013 33.9  30.0 - 36.0 g/dL Final  . RDW 04/25/2013 13.0  11.5 - 15.5 % Final  . Platelets 04/25/2013 250  150 - 400 K/uL Final  . Sodium 04/25/2013 141  137 - 147 mEq/L Final  . Potassium 04/25/2013 4.8  3.7 - 5.3 mEq/L Final  . Chloride 04/25/2013 101  96 - 112 mEq/L Final  . CO2 04/25/2013 28  19 - 32 mEq/L Final  . Glucose, Bld 04/25/2013 106* 70 - 99 mg/dL Final  . BUN 04/25/2013 22  6 - 23 mg/dL Final  . Creatinine, Ser 04/25/2013 1.08  0.50 - 1.10 mg/dL Final  . Calcium 04/25/2013 10.4  8.4 - 10.5 mg/dL Final  . Total Protein 04/25/2013 7.4  6.0 - 8.3 g/dL Final  . Albumin 04/25/2013 4.3  3.5 - 5.2 g/dL Final  . AST 04/25/2013 18  0 - 37 U/L Final  . ALT 04/25/2013 15  0 - 35 U/L Final  . Alkaline Phosphatase 04/25/2013 71  39 - 117 U/L Final  . Total Bilirubin 04/25/2013 0.3  0.3 - 1.2 mg/dL Final  .  GFR calc non Af Amer 04/25/2013 46* >90 mL/min Final  . GFR calc Af Amer 04/25/2013 53* >90 mL/min Final   Comment: (NOTE)                          The eGFR has been calculated using the CKD EPI equation.                          This calculation has not been validated in all clinical situations.                          eGFR's persistently <90 mL/min signify possible Chronic Kidney                          Disease.  [name] Kitchen Prothrombin Time 04/25/2013 12.2  11.6 - 15.2 seconds Final  . INR 04/25/2013 0.92  0.00 - 1.49 Final  . ABO/RH(D) 04/25/2013 O POS   Final  . Antibody  Screen 04/25/2013 NEG   Final  . Sample Expiration 04/25/2013 05/04/2013   Final  . Color, Urine 04/25/2013 YELLOW  YELLOW Final  . APPearance 04/25/2013 CLEAR  CLEAR Final  . Specific Gravity, Urine 04/25/2013 1.025  1.005 - 1.030 Final  . pH 04/25/2013 5.0  5.0 - 8.0 Final  . Glucose, UA 04/25/2013 NEGATIVE  NEGATIVE mg/dL Final  . Hgb urine dipstick 04/25/2013 NEGATIVE  NEGATIVE Final  . Bilirubin Urine 04/25/2013 NEGATIVE  NEGATIVE Final  . Ketones, ur 04/25/2013 NEGATIVE  NEGATIVE mg/dL Final  . Protein, ur 04/25/2013 NEGATIVE  NEGATIVE mg/dL Final  . Urobilinogen, UA 04/25/2013 0.2  0.0 - 1.0 mg/dL Final  . Nitrite 04/25/2013 NEGATIVE  NEGATIVE Final  . Leukocytes, UA 04/25/2013 NEGATIVE  NEGATIVE Final   MICROSCOPIC NOT DONE ON URINES WITH NEGATIVE PROTEIN, BLOOD, LEUKOCYTES, NITRITE, OR GLUCOSE <1000 mg/dL.  . ABO/RH(D) 04/25/2013 O POS   Final     X-Rays:Dg Chest 2 View  04/25/2013   CLINICAL DATA:  Preoperative for knee surgery  EXAM: CHEST  2 VIEW  COMPARISON:  DG CHEST 2 VIEW dated 10/17/2009  FINDINGS: There is chronic elevation of the right hemidiaphragm. The lungs are clear. The cardiac silhouette is mildly enlarged but stable. The pulmonary vascularity is not engorged. The mediastinum is normal in width. The patient has undergone previous partial mastectomy on the right with right axillary lymph node dissection. The bony thorax exhibits no acute abnormality.  IMPRESSION: There is chronic elevation of the right hemidiaphragm. There is no evidence of active cardiopulmonary disease.   Electronically Signed   By: David  Martinique   On: 04/25/2013 13:12    EKG: Orders placed in visit on 12/27/12  . EKG 12-LEAD  . EKG 12-LEAD  . EKG 12-LEAD     Hospital Course: [name] Morton is a 80 y.o. who was admitted to [facility]. They were brought to the operating room on 05/01/2013 and underwent Procedure(s): LEFT TOTAL KNEE ARTHROPLASTY.  Patient tolerated the procedure well  and was later transferred to the recovery room and then to the orthopaedic floor for postoperative care.  They were given PO and IV analgesics for pain control following their surgery.  They were given 24 hours of postoperative antibiotics of  Anti-infectives   Start     Dose/Rate Route Frequency Ordered Stop   05/01/13 1600  ceFAZolin (ANCEF) IVPB 1 g/50  mL premix     1 g 100 mL/hr over 30 Minutes Intravenous Every 6 hours 05/01/13 1326 05/01/13 2131   05/01/13 0702  ceFAZolin (ANCEF) IVPB 2 g/50 mL premix     2 g 100 mL/hr over 30 Minutes Intravenous On call to O.R. 05/01/13 0600 05/01/13 1042     and started on DVT prophylaxis in the form of Xarelto.   PT and OT were ordered for total joint protocol.  Discharge planning consulted to help with postop disposition and equipment needs.  Patient had a tough night on the evening of surgery due to pain.  One of the daughters is in the room at bedside on day one.  Patient was having problems with pain in the knee, requiring pain medications. Encouraged to use the oral pain meds today for good pain control. Got up with therapy.  She wanted to go home with her brother that lives in [place], [place].  They started to get up OOB with therapy on day one. Hemovac drain was pulled without difficulty.  Continued to work with therapy into day two.  Dressing was changed on day two and the incision was healing well.  By day three, the patient had progressed with therapy and meeting their goals.  Incision was healing well.  Patient was seen in rounds and was ready to go home with her brother in [place], [place].   Discharge Medications: Prior to Admission medications   Medication Sig Start Date End Date Taking? Authorizing Provider  amLODipine-benazepril (LOTREL) 10-20 MG per capsule Take 1 capsule by mouth every morning. 08/10/12  Yes Pixie Casino, MD  bisoprolol (ZEBETA) 5 MG tablet Take 5 mg by mouth daily with lunch.   Yes Historical Provider, MD  furosemide (LASIX) 40 MG  tablet Take 40 mg by mouth daily. TAKES AT LUNCH 01/20/13  Yes Leonie Man, MD  Polyvinyl Alcohol-Povidone (REFRESH OP) Apply 1 drop to eye 2 (two) times daily as needed (Dry eyes).   Yes Historical Provider, MD  simvastatin (ZOCOR) 20 MG tablet Take 1 tablet (20 mg total) by mouth at bedtime. 08/16/12  Yes Leonie Man, MD  traZODone (DESYREL) 50 MG tablet Take 100 mg by mouth at bedtime.   Yes Historical Provider, MD  iron polysaccharides (NIFEREX) 150 MG capsule Take 1 capsule (150 mg total) by mouth 2 (two) times daily. 05/04/13   Korinne Greenstein Dara Lords, PA-C  methocarbamol (ROBAXIN) 500 MG tablet Take 1 tablet (500 mg total) by mouth every 6 (six) hours as needed for muscle spasms. 05/04/13   Ruthann Angulo, PA-C  oxyCODONE (OXY IR/ROXICODONE) 5 MG immediate release tablet Take 1-2 tablets (5-10 mg total) by mouth every 3 (three) hours as needed for moderate pain, severe pain or breakthrough pain. 05/04/13   Salbador Fiveash Dara Lords, PA-C  rivaroxaban (XARELTO) 10 MG TABS tablet Take 1 tablet (10 mg total) by mouth daily with breakfast. Take Xarelto for two and a half more weeks, then discontinue Xarelto. Once the patient has completed the Xarelto, they may resume the 81 mg Aspirin. 05/04/13   Lejla Moeser, PA-C  traMADol (ULTRAM) 50 MG tablet Take 1-2 tablets (50-100 mg total) by mouth every 6 (six) hours as needed (mild to moderate pain). 05/04/13   Dejon Lukas Dara Lords, PA-C   Discharge home with home health  Diet - Cardiac diet  Follow up - in 2 weeks  Activity - WBAT  Disposition - Home with family Condition Upon Discharge - Good  D/C Meds - See DC Summary  DVT Prophylaxis - Xarelto        Discharge Orders   Future Orders Complete By Expires   Call MD / Call 911  As directed    Comments:     If you experience chest pain or shortness of breath, CALL 911 and be transported to the hospital emergency room.  If you develope a fever above 101 F, pus (white drainage) or increased  drainage or redness at the wound, or calf pain, call your surgeon's office.   Change dressing  As directed    Comments:     Change dressing daily with sterile 4 x 4 inch gauze dressing and apply TED hose. Do not submerge the incision under water.   Constipation Prevention  As directed    Comments:     Drink plenty of fluids.  Prune juice may be helpful.  You may use a stool softener, such as Colace (over the counter) 100 mg twice a day.  Use MiraLax (over the counter) for constipation as needed.   Diet - low sodium heart healthy  As directed    Discharge instructions  As directed    Comments:     Pick up stool softner and laxative for home. Do not submerge incision under water. May shower. Continue to use ice for pain and swelling from surgery.  Take Xarelto for two and a half more weeks, then discontinue Xarelto. Once the patient has completed the Xarelto, they may resume the 81 mg Aspirin.   Do not put a pillow under the knee. Place it under the heel.  As directed    Do not sit on low chairs, stoools or toilet seats, as it may be difficult to get up from low surfaces  As directed    Driving restrictions  As directed    Comments:     No driving until released by the physician.   Increase activity slowly as tolerated  As directed    Lifting restrictions  As directed    Comments:     No lifting until released by the physician.   Patient may shower  As directed    Comments:     You may shower without a dressing once there is no drainage.  Do not wash over the wound.  If drainage remains, do not shower until drainage stops.   TED hose  As directed    Comments:     Use stockings (TED hose) for 3 weeks on both leg(s).  You may remove them at night for sleeping.   Weight bearing as tolerated  As directed    Questions:     Laterality:     Extremity:         Medication List    STOP taking these medications       aspirin 81 MG chewable tablet     CALCIUM + D PO     Co Q-10 100  MG Caps     multivitamin with minerals Tabs tablet     vitamin E 400 UNIT capsule      TAKE these medications       amLODipine-benazepril 10-20 MG per capsule  Commonly known as:  LOTREL  Take 1 capsule by mouth every morning.     bisoprolol 5 MG tablet  Commonly known as:  ZEBETA  Take 5 mg by mouth daily with lunch.     furosemide 40 MG tablet  Commonly known as:  LASIX  Take 40 mg by mouth daily. TAKES AT  LUNCH     iron polysaccharides 150 MG capsule  Commonly known as:  NIFEREX  Take 1 capsule (150 mg total) by mouth 2 (two) times daily.     methocarbamol 500 MG tablet  Commonly known as:  ROBAXIN  Take 1 tablet (500 mg total) by mouth every 6 (six) hours as needed for muscle spasms.     oxyCODONE 5 MG immediate release tablet  Commonly known as:  Oxy IR/ROXICODONE  Take 1-2 tablets (5-10 mg total) by mouth every 3 (three) hours as needed for moderate pain, severe pain or breakthrough pain.     REFRESH OP  Apply 1 drop to eye 2 (two) times daily as needed (Dry eyes).     rivaroxaban 10 MG Tabs tablet  Commonly known as:  XARELTO  - Take 1 tablet (10 mg total) by mouth daily with breakfast. Take Xarelto for two and a half more weeks, then discontinue Xarelto.  - Once the patient has completed the Xarelto, they may resume the 81 mg Aspirin.     simvastatin 20 MG tablet  Commonly known as:  ZOCOR  Take 1 tablet (20 mg total) by mouth at bedtime.     traMADol 50 MG tablet  Commonly known as:  ULTRAM  Take 1-2 tablets (50-100 mg total) by mouth every 6 (six) hours as needed (mild to moderate pain).     traZODone 50 MG tablet  Commonly known as:  DESYREL  Take 100 mg by mouth at bedtime.       Follow-up Information   Follow up with Gearlean Alf, MD. Schedule an appointment as soon as possible for a visit in 2 weeks.   Specialty:  Orthopedic Surgery   Contact information:   [address] [place] [address] [phone]       Follow  up with [place].)    Contact information:   [address] [place] [address] [phone]       Signed: Mickel Crow 05/23/2013, 3:11 PM

## 2013-05-05 DIAGNOSIS — Z96659 Presence of unspecified artificial knee joint: Secondary | ICD-10-CM | POA: Diagnosis not present

## 2013-05-05 DIAGNOSIS — Z853 Personal history of malignant neoplasm of breast: Secondary | ICD-10-CM | POA: Diagnosis not present

## 2013-05-05 DIAGNOSIS — Z471 Aftercare following joint replacement surgery: Secondary | ICD-10-CM | POA: Diagnosis not present

## 2013-05-05 DIAGNOSIS — I10 Essential (primary) hypertension: Secondary | ICD-10-CM | POA: Diagnosis not present

## 2013-05-05 DIAGNOSIS — IMO0001 Reserved for inherently not codable concepts without codable children: Secondary | ICD-10-CM | POA: Diagnosis not present

## 2013-05-05 DIAGNOSIS — Z7901 Long term (current) use of anticoagulants: Secondary | ICD-10-CM | POA: Diagnosis not present

## 2013-05-06 DIAGNOSIS — Z7901 Long term (current) use of anticoagulants: Secondary | ICD-10-CM | POA: Diagnosis not present

## 2013-05-06 DIAGNOSIS — Z471 Aftercare following joint replacement surgery: Secondary | ICD-10-CM | POA: Diagnosis not present

## 2013-05-06 DIAGNOSIS — I10 Essential (primary) hypertension: Secondary | ICD-10-CM | POA: Diagnosis not present

## 2013-05-06 DIAGNOSIS — IMO0001 Reserved for inherently not codable concepts without codable children: Secondary | ICD-10-CM | POA: Diagnosis not present

## 2013-05-06 DIAGNOSIS — Z853 Personal history of malignant neoplasm of breast: Secondary | ICD-10-CM | POA: Diagnosis not present

## 2013-05-06 DIAGNOSIS — Z96659 Presence of unspecified artificial knee joint: Secondary | ICD-10-CM | POA: Diagnosis not present

## 2013-05-08 DIAGNOSIS — IMO0001 Reserved for inherently not codable concepts without codable children: Secondary | ICD-10-CM | POA: Diagnosis not present

## 2013-05-08 DIAGNOSIS — Z7901 Long term (current) use of anticoagulants: Secondary | ICD-10-CM | POA: Diagnosis not present

## 2013-05-08 DIAGNOSIS — I10 Essential (primary) hypertension: Secondary | ICD-10-CM | POA: Diagnosis not present

## 2013-05-08 DIAGNOSIS — Z96659 Presence of unspecified artificial knee joint: Secondary | ICD-10-CM | POA: Diagnosis not present

## 2013-05-08 DIAGNOSIS — Z471 Aftercare following joint replacement surgery: Secondary | ICD-10-CM | POA: Diagnosis not present

## 2013-05-08 DIAGNOSIS — Z853 Personal history of malignant neoplasm of breast: Secondary | ICD-10-CM | POA: Diagnosis not present

## 2013-05-09 DIAGNOSIS — I10 Essential (primary) hypertension: Secondary | ICD-10-CM | POA: Diagnosis not present

## 2013-05-09 DIAGNOSIS — Z7901 Long term (current) use of anticoagulants: Secondary | ICD-10-CM | POA: Diagnosis not present

## 2013-05-09 DIAGNOSIS — IMO0001 Reserved for inherently not codable concepts without codable children: Secondary | ICD-10-CM | POA: Diagnosis not present

## 2013-05-09 DIAGNOSIS — Z96659 Presence of unspecified artificial knee joint: Secondary | ICD-10-CM | POA: Diagnosis not present

## 2013-05-09 DIAGNOSIS — Z471 Aftercare following joint replacement surgery: Secondary | ICD-10-CM | POA: Diagnosis not present

## 2013-05-09 DIAGNOSIS — Z853 Personal history of malignant neoplasm of breast: Secondary | ICD-10-CM | POA: Diagnosis not present

## 2013-05-10 DIAGNOSIS — IMO0001 Reserved for inherently not codable concepts without codable children: Secondary | ICD-10-CM | POA: Diagnosis not present

## 2013-05-10 DIAGNOSIS — Z853 Personal history of malignant neoplasm of breast: Secondary | ICD-10-CM | POA: Diagnosis not present

## 2013-05-10 DIAGNOSIS — Z96659 Presence of unspecified artificial knee joint: Secondary | ICD-10-CM | POA: Diagnosis not present

## 2013-05-10 DIAGNOSIS — I10 Essential (primary) hypertension: Secondary | ICD-10-CM | POA: Diagnosis not present

## 2013-05-10 DIAGNOSIS — Z7901 Long term (current) use of anticoagulants: Secondary | ICD-10-CM | POA: Diagnosis not present

## 2013-05-10 DIAGNOSIS — Z471 Aftercare following joint replacement surgery: Secondary | ICD-10-CM | POA: Diagnosis not present

## 2013-05-12 DIAGNOSIS — Z471 Aftercare following joint replacement surgery: Secondary | ICD-10-CM | POA: Diagnosis not present

## 2013-05-12 DIAGNOSIS — Z7901 Long term (current) use of anticoagulants: Secondary | ICD-10-CM | POA: Diagnosis not present

## 2013-05-12 DIAGNOSIS — Z853 Personal history of malignant neoplasm of breast: Secondary | ICD-10-CM | POA: Diagnosis not present

## 2013-05-12 DIAGNOSIS — IMO0001 Reserved for inherently not codable concepts without codable children: Secondary | ICD-10-CM | POA: Diagnosis not present

## 2013-05-12 DIAGNOSIS — Z96659 Presence of unspecified artificial knee joint: Secondary | ICD-10-CM | POA: Diagnosis not present

## 2013-05-12 DIAGNOSIS — I10 Essential (primary) hypertension: Secondary | ICD-10-CM | POA: Diagnosis not present

## 2013-05-15 DIAGNOSIS — Z853 Personal history of malignant neoplasm of breast: Secondary | ICD-10-CM | POA: Diagnosis not present

## 2013-05-15 DIAGNOSIS — IMO0001 Reserved for inherently not codable concepts without codable children: Secondary | ICD-10-CM | POA: Diagnosis not present

## 2013-05-15 DIAGNOSIS — Z96659 Presence of unspecified artificial knee joint: Secondary | ICD-10-CM | POA: Diagnosis not present

## 2013-05-15 DIAGNOSIS — I10 Essential (primary) hypertension: Secondary | ICD-10-CM | POA: Diagnosis not present

## 2013-05-15 DIAGNOSIS — Z7901 Long term (current) use of anticoagulants: Secondary | ICD-10-CM | POA: Diagnosis not present

## 2013-05-15 DIAGNOSIS — Z471 Aftercare following joint replacement surgery: Secondary | ICD-10-CM | POA: Diagnosis not present

## 2013-05-17 DIAGNOSIS — Z96659 Presence of unspecified artificial knee joint: Secondary | ICD-10-CM | POA: Diagnosis not present

## 2013-05-17 DIAGNOSIS — Z853 Personal history of malignant neoplasm of breast: Secondary | ICD-10-CM | POA: Diagnosis not present

## 2013-05-17 DIAGNOSIS — I10 Essential (primary) hypertension: Secondary | ICD-10-CM | POA: Diagnosis not present

## 2013-05-17 DIAGNOSIS — Z7901 Long term (current) use of anticoagulants: Secondary | ICD-10-CM | POA: Diagnosis not present

## 2013-05-17 DIAGNOSIS — IMO0001 Reserved for inherently not codable concepts without codable children: Secondary | ICD-10-CM | POA: Diagnosis not present

## 2013-05-17 DIAGNOSIS — Z471 Aftercare following joint replacement surgery: Secondary | ICD-10-CM | POA: Diagnosis not present

## 2013-05-18 DIAGNOSIS — Z853 Personal history of malignant neoplasm of breast: Secondary | ICD-10-CM | POA: Diagnosis not present

## 2013-05-18 DIAGNOSIS — Z96659 Presence of unspecified artificial knee joint: Secondary | ICD-10-CM | POA: Diagnosis not present

## 2013-05-18 DIAGNOSIS — I10 Essential (primary) hypertension: Secondary | ICD-10-CM | POA: Diagnosis not present

## 2013-05-18 DIAGNOSIS — IMO0001 Reserved for inherently not codable concepts without codable children: Secondary | ICD-10-CM | POA: Diagnosis not present

## 2013-05-18 DIAGNOSIS — Z7901 Long term (current) use of anticoagulants: Secondary | ICD-10-CM | POA: Diagnosis not present

## 2013-05-18 DIAGNOSIS — Z471 Aftercare following joint replacement surgery: Secondary | ICD-10-CM | POA: Diagnosis not present

## 2013-05-19 DIAGNOSIS — Z7901 Long term (current) use of anticoagulants: Secondary | ICD-10-CM | POA: Diagnosis not present

## 2013-05-19 DIAGNOSIS — Z853 Personal history of malignant neoplasm of breast: Secondary | ICD-10-CM | POA: Diagnosis not present

## 2013-05-19 DIAGNOSIS — Z96659 Presence of unspecified artificial knee joint: Secondary | ICD-10-CM | POA: Diagnosis not present

## 2013-05-19 DIAGNOSIS — Z471 Aftercare following joint replacement surgery: Secondary | ICD-10-CM | POA: Diagnosis not present

## 2013-05-19 DIAGNOSIS — IMO0001 Reserved for inherently not codable concepts without codable children: Secondary | ICD-10-CM | POA: Diagnosis not present

## 2013-05-19 DIAGNOSIS — I10 Essential (primary) hypertension: Secondary | ICD-10-CM | POA: Diagnosis not present

## 2013-05-22 DIAGNOSIS — IMO0001 Reserved for inherently not codable concepts without codable children: Secondary | ICD-10-CM | POA: Diagnosis not present

## 2013-05-22 DIAGNOSIS — I10 Essential (primary) hypertension: Secondary | ICD-10-CM | POA: Diagnosis not present

## 2013-05-22 DIAGNOSIS — Z96659 Presence of unspecified artificial knee joint: Secondary | ICD-10-CM | POA: Diagnosis not present

## 2013-05-22 DIAGNOSIS — Z471 Aftercare following joint replacement surgery: Secondary | ICD-10-CM | POA: Diagnosis not present

## 2013-05-22 DIAGNOSIS — Z853 Personal history of malignant neoplasm of breast: Secondary | ICD-10-CM | POA: Diagnosis not present

## 2013-05-22 DIAGNOSIS — Z7901 Long term (current) use of anticoagulants: Secondary | ICD-10-CM | POA: Diagnosis not present

## 2013-05-23 DIAGNOSIS — R634 Abnormal weight loss: Secondary | ICD-10-CM | POA: Diagnosis not present

## 2013-05-23 DIAGNOSIS — R279 Unspecified lack of coordination: Secondary | ICD-10-CM | POA: Diagnosis not present

## 2013-05-23 DIAGNOSIS — G589 Mononeuropathy, unspecified: Secondary | ICD-10-CM | POA: Diagnosis not present

## 2013-05-23 DIAGNOSIS — I951 Orthostatic hypotension: Secondary | ICD-10-CM | POA: Diagnosis not present

## 2013-05-23 DIAGNOSIS — R109 Unspecified abdominal pain: Secondary | ICD-10-CM | POA: Diagnosis not present

## 2013-05-24 DIAGNOSIS — I10 Essential (primary) hypertension: Secondary | ICD-10-CM | POA: Diagnosis not present

## 2013-05-24 DIAGNOSIS — Z471 Aftercare following joint replacement surgery: Secondary | ICD-10-CM | POA: Diagnosis not present

## 2013-05-24 DIAGNOSIS — Z7901 Long term (current) use of anticoagulants: Secondary | ICD-10-CM | POA: Diagnosis not present

## 2013-05-24 DIAGNOSIS — IMO0001 Reserved for inherently not codable concepts without codable children: Secondary | ICD-10-CM | POA: Diagnosis not present

## 2013-05-24 DIAGNOSIS — Z96659 Presence of unspecified artificial knee joint: Secondary | ICD-10-CM | POA: Diagnosis not present

## 2013-05-24 DIAGNOSIS — Z853 Personal history of malignant neoplasm of breast: Secondary | ICD-10-CM | POA: Diagnosis not present

## 2013-05-26 DIAGNOSIS — IMO0001 Reserved for inherently not codable concepts without codable children: Secondary | ICD-10-CM | POA: Diagnosis not present

## 2013-05-26 DIAGNOSIS — Z96659 Presence of unspecified artificial knee joint: Secondary | ICD-10-CM | POA: Diagnosis not present

## 2013-05-26 DIAGNOSIS — Z471 Aftercare following joint replacement surgery: Secondary | ICD-10-CM | POA: Diagnosis not present

## 2013-05-26 DIAGNOSIS — Z853 Personal history of malignant neoplasm of breast: Secondary | ICD-10-CM | POA: Diagnosis not present

## 2013-05-26 DIAGNOSIS — Z7901 Long term (current) use of anticoagulants: Secondary | ICD-10-CM | POA: Diagnosis not present

## 2013-05-26 DIAGNOSIS — I10 Essential (primary) hypertension: Secondary | ICD-10-CM | POA: Diagnosis not present

## 2013-05-30 DIAGNOSIS — M1710 Unilateral primary osteoarthritis, unspecified knee: Secondary | ICD-10-CM | POA: Diagnosis not present

## 2013-06-06 DIAGNOSIS — M1710 Unilateral primary osteoarthritis, unspecified knee: Secondary | ICD-10-CM | POA: Diagnosis not present

## 2013-06-08 DIAGNOSIS — M1710 Unilateral primary osteoarthritis, unspecified knee: Secondary | ICD-10-CM | POA: Diagnosis not present

## 2013-06-13 DIAGNOSIS — M1710 Unilateral primary osteoarthritis, unspecified knee: Secondary | ICD-10-CM | POA: Diagnosis not present

## 2013-06-15 DIAGNOSIS — M1710 Unilateral primary osteoarthritis, unspecified knee: Secondary | ICD-10-CM | POA: Diagnosis not present

## 2013-06-22 DIAGNOSIS — M1710 Unilateral primary osteoarthritis, unspecified knee: Secondary | ICD-10-CM | POA: Diagnosis not present

## 2013-06-27 DIAGNOSIS — M1710 Unilateral primary osteoarthritis, unspecified knee: Secondary | ICD-10-CM | POA: Diagnosis not present

## 2013-07-04 ENCOUNTER — Other Ambulatory Visit: Payer: Self-pay | Admitting: *Deleted

## 2013-07-04 MED ORDER — AMLODIPINE BESY-BENAZEPRIL HCL 10-20 MG PO CAPS: 1.0000 | ORAL_CAPSULE | Freq: Every morning | ORAL | Status: DC

## 2013-07-04 NOTE — Telephone Encounter (Signed)
Rx was sent to pharmacy electronically. 

## 2013-07-05 ENCOUNTER — Other Ambulatory Visit: Payer: Self-pay | Admitting: *Deleted

## 2013-07-06 DIAGNOSIS — Z0000 Encounter for general adult medical examination without abnormal findings: Secondary | ICD-10-CM | POA: Diagnosis not present

## 2013-07-06 DIAGNOSIS — N183 Chronic kidney disease, stage 3 unspecified: Secondary | ICD-10-CM | POA: Diagnosis not present

## 2013-07-06 DIAGNOSIS — I10 Essential (primary) hypertension: Secondary | ICD-10-CM | POA: Diagnosis not present

## 2013-07-06 DIAGNOSIS — E7800 Pure hypercholesterolemia, unspecified: Secondary | ICD-10-CM | POA: Diagnosis not present

## 2013-07-06 DIAGNOSIS — Z23 Encounter for immunization: Secondary | ICD-10-CM | POA: Diagnosis not present

## 2013-07-06 DIAGNOSIS — D473 Essential (hemorrhagic) thrombocythemia: Secondary | ICD-10-CM | POA: Diagnosis not present

## 2013-09-08 ENCOUNTER — Other Ambulatory Visit: Payer: Self-pay | Admitting: Cardiology

## 2013-09-08 NOTE — Telephone Encounter (Signed)
Rx was sent to pharmacy electronically. 

## 2013-11-19 ENCOUNTER — Other Ambulatory Visit: Payer: Self-pay | Admitting: Cardiology

## 2013-11-20 NOTE — Telephone Encounter (Signed)
Rx was sent to pharmacy electronically. 

## 2013-11-25 ENCOUNTER — Other Ambulatory Visit: Payer: Self-pay | Admitting: Cardiology

## 2013-11-27 NOTE — Telephone Encounter (Signed)
Rx was sent to pharmacy electronically. 

## 2013-11-30 ENCOUNTER — Ambulatory Visit (INDEPENDENT_AMBULATORY_CARE_PROVIDER_SITE_OTHER): Payer: Medicare Other | Admitting: Cardiology

## 2013-11-30 ENCOUNTER — Encounter: Payer: Self-pay | Admitting: Cardiology

## 2013-11-30 VITALS — BP 140/58 | HR 65 | Ht 60.0 in | Wt 130.8 lb

## 2013-11-30 DIAGNOSIS — E785 Hyperlipidemia, unspecified: Secondary | ICD-10-CM | POA: Diagnosis not present

## 2013-11-30 DIAGNOSIS — I10 Essential (primary) hypertension: Secondary | ICD-10-CM | POA: Diagnosis not present

## 2013-11-30 DIAGNOSIS — R600 Localized edema: Secondary | ICD-10-CM

## 2013-11-30 NOTE — Patient Instructions (Signed)
You are doing well.  Amazing weight loss!.  Hope the knee gets better.    No medication changes.   I will see you back in 1 yr.  Leonie Man, MD  Your physician recommends that you schedule a follow-up appointment in: 1 yr.

## 2013-11-30 NOTE — Progress Notes (Signed)
PCP: Marjorie Smolder, MD  Clinic Note: Chief Complaint  Patient presents with  . Follow-up    no complaints    HPI: [name] Morton) is a 80 y.o. female with a PMH below who presents today for annual follow-up for evaluation of HTN, HLD & LE edema.  She had been seeing Dr. Aldona Bar for years prior to changing to me after his retirement.  She has had a cardiac workup in the distant past, but has not had any notable cardiac issues.  Past Medical History  Diagnosis Date  . History of nuclear stress test 11/2009    dipyridamole; no evidence of ischemia, normal, low risk   . Dyslipidemia   . Breast cancer 1997    right - tx with lumpectomty & radiation  . Hearing impairment     BILATERAL HEARING AIDS  . History of shingles 1998    NO RESIDUAL PROBLEMS  . Sinus problem   . Shortness of breath     WHEN CLIMBING STEPS AT MY HOME--OTHERWISE OK  . GERD (gastroesophageal reflux disease)     RARE - NO MEDICATIONS  . Lymphedema of arm     RIGHT   . OA (osteoarthritis)     OA AND PAIN LEFT KNEE  . Diverticulosis   . Sleep difficulties     ALWAYS HAD SLEEPING PROBLEMS - MEDICATION HELPS PT SLEEP ABOUT 3 HOURS  . Hypertension     SEES CARDIOLOGIST DR. DAVID HARDING FOR HYPERTENSION    Recent visit in October 20 14th, she has had the following surgery   . Total knee arthroplasty Left 05/01/2013    Procedure: LEFT TOTAL KNEE ARTHROPLASTY;  Surgeon: Gearlean Alf, MD;  Location: [facility];  Service: Orthopedics;  Laterality: Left;    Interval History: She presents today in good spirits and good mood. She does not have any major concerns besides the fact that she still has some stiffness in her knee and can't fully bend it. There is some pain associated with it. She has stable mild lower extremity edema. Her cardiac standpoint she denies any significant symptoms:.  No chest pain or shortness of breath with rest or exertion. No PND, orthopneA.  No palpitations, lightheadedness,  dizziness, weakness or syncope/near syncope. As a result of her surgery, she has lost a significant amount of weight. She has lost 18 pounds since her last visit. This is partly related to just not eating a lot because of ulcers that she gets in her mouth.  ROS: A comprehensive Review of Systems - was performed Review of Systems  Constitutional: Positive for weight loss. Negative for malaise/fatigue.       Overall poor by mouth intake with mouth ulcers  HENT: Negative for congestion and nosebleeds.   Eyes: Negative for double vision.  Respiratory: Negative for cough, hemoptysis, sputum production, shortness of breath and wheezing.   Cardiovascular: Negative for chest pain, palpitations, orthopnea, claudication and PND.       She has swelling of the Left knee  Gastrointestinal: Negative for heartburn, blood in stool and melena.  Genitourinary: Negative for hematuria.  Musculoskeletal: Positive for joint pain.       L Knee - still stiff & painful.  Unable to fully extend or flex  Neurological: Negative for dizziness, sensory change, speech change, focal weakness, seizures and loss of consciousness.  Endo/Heme/Allergies: Does not bruise/bleed easily.  Psychiatric/Behavioral: Negative for depression. The patient is not nervous/anxious.   All other systems reviewed and are negative.  Current Outpatient Prescriptions on File Prior to Visit  Medication Sig Dispense Refill  . amLODipine-benazepril (LOTREL) 10-20 MG per capsule Take 1 capsule by mouth daily. NEED APPOINTMENT FOR FUTURE REFILLS.  90 capsule  0  . bisoprolol (ZEBETA) 5 MG tablet Take 1 tablet (5 mg total) by mouth daily. NEED APPOINTMENT FOR FUTURE REFILLS.  90 tablet  0  . furosemide (LASIX) 40 MG tablet Take 1 tablet (40 mg total) by mouth daily. NEED APPOINTMENT FOR FUTURE REFILLS.  90 tablet  0  . simvastatin (ZOCOR) 20 MG tablet Take 1 tablet (20 mg total) by mouth at bedtime.  90 tablet  3  . traZODone (DESYREL) 50 MG tablet  Take 100 mg by mouth at bedtime.       No current facility-administered medications on file prior to visit.   ALLERGIES REVIEWED IN EPIC -- No change SOCIAL AND FAMILY HISTORY REVIEWED IN EPIC -- No change  Wt Readings from Last 3 Encounters:  11/30/13 130 lb 12.8 oz (59.33 kg)  05/01/13 144 lb (65.318 kg)  05/01/13 144 lb (65.318 kg)   PHYSICAL EXAM BP 140/58  Pulse 65  Ht 5' (1.524 m)  Wt 130 lb 12.8 oz (59.33 kg)  BMI 25.54 kg/m2 General appearance: alert, cooperative, appears stated age, no distress and Well-nourished and well-groomed. Pleasant mood and affect. Answers questions appropriately  HEENT: [place]/AT, EOMI, MMM, anicteric sclera Neck: no adenopathy, no carotid bruit, no JVD and supple, symmetrical, trachea midline  Lungs: clear to auscultation bilaterally, normal percussion bilaterally and Nonlabored, good air movement  Heart: RRR, S1, S2 normal, no S3 or S4 and Soft 1/6 SEM at RUSB radiating to carotids. No other M./R./G. nondisplaced PMI  Abdomen: soft, non-tender; bowel sounds normal; no masses, no organomegaly  Extremities: extremities normal, atraumatic, no cyanosis or edema and varicose veins noted  Pulses: 2+ and symmetric  Neurologic: Grossly normal   Adult ECG Report  Rate: 65 ;  Rhythm: normal sinus rhythm with 1 A-V B..  Narrative Interpretation: Stable  Recent Labs:  None available; lipids are due to be checked by PCP.  ASSESSMENT / PLAN: Hypertension, essential, benign Acceptable control with blood pressure 140/58. I'm not inclined to do much more aggressive management. Continue current medications.  Dyslipidemia, goal LDL below 100 I don't have the most recent labs. They're being monitored by PCP. She is on a statin.  Bilateral leg edema Well-controlled on low-dose furosemide. No other signs to suggest that this is related to heart failure as she has no PND or orthopnea.    Orders Placed This Encounter  Procedures  . EKG 12-Lead    Followup:  One year   Leonie Man, M.D., M.S. Interventional Cardiologist   Pager # [phone]

## 2013-12-02 NOTE — Assessment & Plan Note (Addendum)
Well-controlled on low-dose furosemide. No other signs to suggest that this is related to heart failure as she has no PND or orthopnea.

## 2013-12-02 NOTE — Assessment & Plan Note (Signed)
I don't have the most recent labs. They're being monitored by PCP. She is on a statin.

## 2013-12-02 NOTE — Assessment & Plan Note (Signed)
Acceptable control with blood pressure 140/58. I'm not inclined to do much more aggressive management. Continue current medications.

## 2014-01-01 DIAGNOSIS — Z23 Encounter for immunization: Secondary | ICD-10-CM | POA: Diagnosis not present

## 2014-01-31 ENCOUNTER — Other Ambulatory Visit: Payer: Self-pay | Admitting: Cardiology

## 2014-02-01 NOTE — Telephone Encounter (Signed)
Rx was sent to pharmacy electronically. 

## 2014-02-07 ENCOUNTER — Other Ambulatory Visit: Payer: Self-pay | Admitting: Cardiology

## 2014-02-07 NOTE — Telephone Encounter (Signed)
Rx was sent to pharmacy electronically. 

## 2014-03-06 DIAGNOSIS — Z974 Presence of external hearing-aid: Secondary | ICD-10-CM | POA: Diagnosis not present

## 2014-03-06 DIAGNOSIS — H9110 Presbycusis, unspecified ear: Secondary | ICD-10-CM | POA: Diagnosis not present

## 2014-03-31 ENCOUNTER — Inpatient Hospital Stay (HOSPITAL_COMMUNITY)
Admission: EM | Admit: 2014-03-31 | Discharge: 2014-04-06 | DRG: 394 | Disposition: A | Discharge status: Home / Self Care | Payer: Medicare Other | Attending: Internal Medicine | Admitting: Internal Medicine

## 2014-03-31 ENCOUNTER — Encounter (HOSPITAL_COMMUNITY): Payer: Self-pay | Admitting: *Deleted

## 2014-03-31 ENCOUNTER — Emergency Department (HOSPITAL_COMMUNITY): Payer: Medicare Other

## 2014-03-31 DIAGNOSIS — K921 Melena: Secondary | ICD-10-CM | POA: Diagnosis not present

## 2014-03-31 DIAGNOSIS — K5730 Diverticulosis of large intestine without perforation or abscess without bleeding: Secondary | ICD-10-CM | POA: Diagnosis present

## 2014-03-31 DIAGNOSIS — K868 Other specified diseases of pancreas: Secondary | ICD-10-CM | POA: Diagnosis not present

## 2014-03-31 DIAGNOSIS — Z853 Personal history of malignant neoplasm of breast: Secondary | ICD-10-CM

## 2014-03-31 DIAGNOSIS — K6389 Other specified diseases of intestine: Secondary | ICD-10-CM | POA: Diagnosis not present

## 2014-03-31 DIAGNOSIS — Z7982 Long term (current) use of aspirin: Secondary | ICD-10-CM

## 2014-03-31 DIAGNOSIS — Z961 Presence of intraocular lens: Secondary | ICD-10-CM | POA: Diagnosis present

## 2014-03-31 DIAGNOSIS — Z96652 Presence of left artificial knee joint: Secondary | ICD-10-CM | POA: Diagnosis present

## 2014-03-31 DIAGNOSIS — D62 Acute posthemorrhagic anemia: Secondary | ICD-10-CM | POA: Diagnosis present

## 2014-03-31 DIAGNOSIS — K559 Vascular disorder of intestine, unspecified: Principal | ICD-10-CM | POA: Diagnosis present

## 2014-03-31 DIAGNOSIS — R109 Unspecified abdominal pain: Secondary | ICD-10-CM | POA: Diagnosis not present

## 2014-03-31 DIAGNOSIS — H9190 Unspecified hearing loss, unspecified ear: Secondary | ICD-10-CM | POA: Diagnosis present

## 2014-03-31 DIAGNOSIS — K838 Other specified diseases of biliary tract: Secondary | ICD-10-CM | POA: Diagnosis not present

## 2014-03-31 DIAGNOSIS — I517 Cardiomegaly: Secondary | ICD-10-CM | POA: Diagnosis not present

## 2014-03-31 DIAGNOSIS — K529 Noninfective gastroenteritis and colitis, unspecified: Secondary | ICD-10-CM

## 2014-03-31 DIAGNOSIS — K625 Hemorrhage of anus and rectum: Secondary | ICD-10-CM

## 2014-03-31 DIAGNOSIS — I10 Essential (primary) hypertension: Secondary | ICD-10-CM | POA: Diagnosis present

## 2014-03-31 DIAGNOSIS — R1110 Vomiting, unspecified: Secondary | ICD-10-CM | POA: Diagnosis not present

## 2014-03-31 DIAGNOSIS — I5032 Chronic diastolic (congestive) heart failure: Secondary | ICD-10-CM | POA: Diagnosis not present

## 2014-03-31 DIAGNOSIS — Z888 Allergy status to other drugs, medicaments and biological substances status: Secondary | ICD-10-CM

## 2014-03-31 DIAGNOSIS — Z9842 Cataract extraction status, left eye: Secondary | ICD-10-CM

## 2014-03-31 DIAGNOSIS — M1710 Unilateral primary osteoarthritis, unspecified knee: Secondary | ICD-10-CM | POA: Diagnosis present

## 2014-03-31 DIAGNOSIS — R55 Syncope and collapse: Secondary | ICD-10-CM | POA: Diagnosis not present

## 2014-03-31 DIAGNOSIS — M179 Osteoarthritis of knee, unspecified: Secondary | ICD-10-CM | POA: Diagnosis present

## 2014-03-31 DIAGNOSIS — Z90710 Acquired absence of both cervix and uterus: Secondary | ICD-10-CM

## 2014-03-31 DIAGNOSIS — Z9841 Cataract extraction status, right eye: Secondary | ICD-10-CM

## 2014-03-31 DIAGNOSIS — R1032 Left lower quadrant pain: Secondary | ICD-10-CM | POA: Diagnosis not present

## 2014-03-31 DIAGNOSIS — E785 Hyperlipidemia, unspecified: Secondary | ICD-10-CM | POA: Diagnosis present

## 2014-03-31 DIAGNOSIS — K219 Gastro-esophageal reflux disease without esophagitis: Secondary | ICD-10-CM | POA: Diagnosis present

## 2014-03-31 DIAGNOSIS — K828 Other specified diseases of gallbladder: Secondary | ICD-10-CM | POA: Diagnosis not present

## 2014-03-31 LAB — COMPREHENSIVE METABOLIC PANEL
ALBUMIN: 4.4 g/dL (ref 3.5–5.2)
ALT: 20 U/L (ref 0–35)
AST: 24 U/L (ref 0–37)
Alkaline Phosphatase: 87 U/L (ref 39–117)
Anion gap: 11 (ref 5–15)
BUN: 20 mg/dL (ref 6–23)
CALCIUM: 9.9 mg/dL (ref 8.4–10.5)
CO2: 27 mmol/L (ref 19–32)
CREATININE: 1.19 mg/dL — AB (ref 0.50–1.10)
Chloride: 101 mmol/L (ref 96–112)
GFR calc Af Amer: 47 mL/min — ABNORMAL LOW (ref >90)
GFR, EST NON AFRICAN AMERICAN: 40 mL/min — AB (ref >90)
Glucose, Bld: 137 mg/dL — ABNORMAL HIGH (ref 70–99)
POTASSIUM: 3.7 mmol/L (ref 3.5–5.1)
Sodium: 139 mmol/L (ref 135–145)
TOTAL PROTEIN: 7.7 g/dL (ref 6.0–8.3)
Total Bilirubin: 0.6 mg/dL (ref 0.3–1.2)

## 2014-03-31 LAB — CBC WITH DIFFERENTIAL/PLATELET
BASOS ABS: 0 10*3/uL (ref 0.0–0.1)
BASOS PCT: 0 % (ref 0–1)
EOS PCT: 0 % (ref 0–5)
Eosinophils Absolute: 0 10*3/uL (ref 0.0–0.7)
HCT: 40.9 % (ref 36.0–46.0)
Hemoglobin: 14 g/dL (ref 12.0–15.0)
LYMPHS ABS: 1.9 10*3/uL (ref 0.7–4.0)
Lymphocytes Relative: 16 % (ref 12–46)
MCH: 29.7 pg (ref 26.0–34.0)
MCHC: 34.2 g/dL (ref 30.0–36.0)
MCV: 86.8 fL (ref 78.0–100.0)
MONO ABS: 0.8 10*3/uL (ref 0.1–1.0)
Monocytes Relative: 7 % (ref 3–12)
Neutro Abs: 9.1 10*3/uL — ABNORMAL HIGH (ref 1.7–7.7)
Neutrophils Relative %: 77 % (ref 43–77)
PLATELETS: 230 10*3/uL (ref 150–400)
RBC: 4.71 MIL/uL (ref 3.87–5.11)
RDW: 13.7 % (ref 11.5–15.5)
WBC: 11.9 10*3/uL — ABNORMAL HIGH (ref 4.0–10.5)

## 2014-03-31 LAB — PROTIME-INR
INR: 0.99 (ref 0.00–1.49)
Prothrombin Time: 13.2 seconds (ref 11.6–15.2)

## 2014-03-31 LAB — TYPE AND SCREEN
ABO/RH(D): O POS
Antibody Screen: NEGATIVE

## 2014-03-31 LAB — URINALYSIS, ROUTINE W REFLEX MICROSCOPIC
BILIRUBIN URINE: NEGATIVE
Glucose, UA: NEGATIVE mg/dL
Hgb urine dipstick: NEGATIVE
Ketones, ur: NEGATIVE mg/dL
Nitrite: NEGATIVE
PH: 5 (ref 5.0–8.0)
Protein, ur: NEGATIVE mg/dL
SPECIFIC GRAVITY, URINE: 1.024 (ref 1.005–1.030)
Urobilinogen, UA: 0.2 mg/dL (ref 0.0–1.0)

## 2014-03-31 LAB — URINE MICROSCOPIC-ADD ON

## 2014-03-31 LAB — POC OCCULT BLOOD, ED: FECAL OCCULT BLD: POSITIVE — AB

## 2014-03-31 LAB — APTT: aPTT: 31 seconds (ref 24–37)

## 2014-03-31 LAB — LIPASE, BLOOD: Lipase: 32 U/L (ref 11–59)

## 2014-03-31 MED ORDER — SODIUM CHLORIDE 0.9 % IV SOLN
1000.0000 mL | INTRAVENOUS | Status: DC
  Administered 2014-03-31: 1000 mL via INTRAVENOUS

## 2014-03-31 MED ORDER — ONDANSETRON HCL 4 MG/2ML IJ SOLN
4.0000 mg | Freq: Once | INTRAMUSCULAR | Status: AC
  Administered 2014-03-31: 4 mg via INTRAVENOUS
  Filled 2014-03-31: qty 2

## 2014-03-31 MED ORDER — IOHEXOL 300 MG/ML  SOLN
25.0000 mL | INTRAMUSCULAR | Status: AC
  Administered 2014-03-31: 25 mL via ORAL

## 2014-03-31 MED ORDER — IOHEXOL 300 MG/ML  SOLN
80.0000 mL | Freq: Once | INTRAMUSCULAR | Status: AC | PRN
  Administered 2014-03-31: 80 mL via INTRAVENOUS

## 2014-03-31 MED ORDER — MORPHINE SULFATE 4 MG/ML IJ SOLN
4.0000 mg | Freq: Once | INTRAMUSCULAR | Status: AC
  Administered 2014-03-31: 4 mg via INTRAVENOUS
  Filled 2014-03-31: qty 1

## 2014-03-31 MED ORDER — CIPROFLOXACIN IN D5W 400 MG/200ML IV SOLN
400.0000 mg | Freq: Two times a day (BID) | INTRAVENOUS | Status: DC
  Administered 2014-04-01: 400 mg via INTRAVENOUS
  Filled 2014-03-31: qty 200

## 2014-03-31 MED ORDER — METRONIDAZOLE IN NACL 5-0.79 MG/ML-% IV SOLN
500.0000 mg | Freq: Once | INTRAVENOUS | Status: DC
  Administered 2014-04-01: 500 mg via INTRAVENOUS
  Filled 2014-03-31: qty 100

## 2014-03-31 NOTE — ED Notes (Signed)
The pt has had abd pain since 1200n today  At approx 1400 she started having bright and dark red blood  Mixed in with stool.  Hx of diverticulitis.  Pale skin color

## 2014-03-31 NOTE — ED Provider Notes (Signed)
CSN: [id number]     Arrival date & time 03/31/14  1900 History   First MD Initiated Contact with Patient 03/31/14 1942     Chief Complaint  Patient presents with  . Rectal Bleeding    Patient is a 81 y.o. female presenting with hematochezia and abdominal pain. The history is provided by the patient.  Rectal Bleeding Quality:  Bright red (pt noticed clots of blood primarily, maybe a small amount of stool) Duration:  1 day Timing:  Intermittent Context: not constipation   Relieved by:  Nothing Associated symptoms: abdominal pain and vomiting   Associated symptoms: no fever   Abdominal Pain Pain location:  LLQ Pain quality: cramping   Pain radiates to:  RLQ Pain severity:  Severe Timing:  Constant Exacerbated by: bowel movements. Associated symptoms: flatus, hematochezia and vomiting   Associated symptoms: no chest pain, no cough, no fever and no shortness of breath   Vomiting:    Emesis appearance: no blood in her emesis. Risk factors comment:  History of diverticulitis in the past WHen she started feeling poorly she began to get lightheaded.  She went to lay down on the ground but did have a syncopal episode.  She did not hurt herself  Past Medical History  Diagnosis Date  . History of nuclear stress test 11/2009    dipyridamole; no evidence of ischemia, normal, low risk   . Dyslipidemia   . Breast cancer 1997    right - tx with lumpectomty & radiation  . Hearing impairment     BILATERAL HEARING AIDS  . History of shingles 1998    NO RESIDUAL PROBLEMS  . Sinus problem   . Shortness of breath     WHEN CLIMBING STEPS AT MY HOME--OTHERWISE OK  . GERD (gastroesophageal reflux disease)     RARE - NO MEDICATIONS  . Lymphedema of arm     RIGHT   . OA (osteoarthritis)     OA AND PAIN LEFT KNEE  . Diverticulosis   . Sleep difficulties     ALWAYS HAD SLEEPING PROBLEMS - MEDICATION HELPS PT SLEEP ABOUT 3 HOURS  . Hypertension     SEES CARDIOLOGIST DR. DAVID HARDING FOR  HYPERTENSION    Past Surgical History  Procedure Laterality Date  . Replacement total knee  2002  . Rotator cuff repair Right 12/2009    Dr. Lenna Sciara. [place]   . Appendectomy  1951  . Abdominal hysterectomy  1973  . Transthoracic echocardiogram  05/1994    trace mitral/tricuspic/aortic inusff; hyperkinetic LV  . Eye surgery      BILATERAL CATARACT EXTRACTIONS AND LENS IMPLANTS  . Breast lumpectomy Right 1997  . Breast adenoma  1950    EXCISED  . Total knee arthroplasty Left 05/01/2013    Procedure: LEFT TOTAL KNEE ARTHROPLASTY;  Surgeon: Gearlean Alf, MD;  Location: [facility];  Service: Orthopedics;  Laterality: Left;   Family History  Problem Relation Age of Onset  . Heart attack Mother   . Heart Problems Father   . Heart Problems Maternal Grandmother   . Heart Problems Maternal Grandfather   . Asthma Maternal Grandfather   . Heart attack Brother 64  . Heart disease Brother     CABG  . Lung cancer Sister   . Emphysema Sister   . Cancer Sister   . Heart Problems Sister     also asthma  . Narcolepsy Child   . Arthritis Child    History  Substance Use Topics  .  Smoking status: Never Smoker   . Smokeless tobacco: Never Used  . Alcohol Use: No   OB History    No data available     Review of Systems  Constitutional: Negative for fever.  Respiratory: Negative for cough and shortness of breath.   Cardiovascular: Negative for chest pain.  Gastrointestinal: Positive for vomiting, abdominal pain, hematochezia and flatus.  Neurological: Positive for syncope (with her symptoms tonight).  All other systems reviewed and are negative.     Allergies  Benadryl; Cardizem; Epinephrine; Vistaril; and Vytorin  Home Medications   Prior to Admission medications   Medication Sig Start Date End Date Taking? Authorizing Provider  acetaminophen (TYLENOL) 500 MG tablet Take 500 mg by mouth every 6 (six) hours as needed for moderate pain.   Yes Historical Provider, MD   amLODipine-benazepril (LOTREL) 10-20 MG per capsule Take 1 capsule by mouth daily. 02/07/14  Yes Leonie Man, MD  aspirin 81 MG tablet Take 81 mg by mouth at bedtime.   Yes Historical Provider, MD  bisoprolol (ZEBETA) 5 MG tablet Take 1 tablet (5 mg total) by mouth daily. 02/01/14  Yes Leonie Man, MD  Docusate Calcium (STOOL SOFTENER PO) Take 1 tablet by mouth daily.   Yes Historical Provider, MD  furosemide (LASIX) 40 MG tablet Take 1 tablet (40 mg total) by mouth daily. 02/01/14  Yes Leonie Man, MD  polyethylene glycol [facility] / Floria Raveling) packet Take 17 g by mouth every other day.   Yes Historical Provider, MD  simvastatin (ZOCOR) 20 MG tablet Take 1 tablet (20 mg total) by mouth at bedtime. 08/16/12  Yes Leonie Man, MD  traZODone (DESYREL) 50 MG tablet Take 100 mg by mouth at bedtime.   Yes Historical Provider, MD   BP 136/50 mmHg  Pulse 66  Temp(Src) 98.2 F (36.8 C) (Oral)  Resp 17  SpO2 96% Physical Exam  Constitutional: She appears well-developed and well-nourished. No distress.  HENT:  Head: Normocephalic and atraumatic.  Right Ear: External ear normal.  Left Ear: External ear normal.  Eyes: Conjunctivae are normal. Right eye exhibits no discharge. Left eye exhibits no discharge. No scleral icterus.  Neck: Neck supple. No tracheal deviation present.  Cardiovascular: Normal rate, regular rhythm and intact distal pulses.   Pulmonary/Chest: Effort normal and breath sounds normal. No stridor. No respiratory distress. She has no wheezes. She has no rales.  Abdominal: Soft. Bowel sounds are normal. She exhibits no distension. There is tenderness in the left lower quadrant. There is guarding. There is no rigidity and no rebound. No hernia.  Genitourinary: Rectal exam shows no internal hemorrhoid, no mass and no tenderness. Guaiac positive stool.  Musculoskeletal: She exhibits no edema or tenderness.  Neurological: She is alert. She has normal strength. No cranial nerve  deficit (no facial droop, extraocular movements intact, no slurred speech) or sensory deficit. She exhibits normal muscle tone. She displays no seizure activity. Coordination normal.  Skin: Skin is warm and dry. No rash noted.  Psychiatric: She has a normal mood and affect.  Nursing note and vitals reviewed.   ED Course  Procedures (including critical care time) Labs Review Labs Reviewed  COMPREHENSIVE METABOLIC PANEL - Abnormal; Notable for the following:    Glucose, Bld 137 (*)    Creatinine, Ser 1.19 (*)    GFR calc non Af Amer 40 (*)    GFR calc Af Amer 47 (*)    All other components within normal limits  CBC WITH DIFFERENTIAL/PLATELET -  Abnormal; Notable for the following:    WBC 11.9 (*)    Neutro Abs 9.1 (*)    All other components within normal limits  URINALYSIS, ROUTINE W REFLEX MICROSCOPIC - Abnormal; Notable for the following:    Leukocytes, UA SMALL (*)    All other components within normal limits  URINE MICROSCOPIC-ADD ON - Abnormal; Notable for the following:    Casts HYALINE CASTS (*)    All other components within normal limits  POC OCCULT BLOOD, ED - Abnormal; Notable for the following:    Fecal Occult Bld POSITIVE (*)    All other components within normal limits  LIPASE, BLOOD  APTT  PROTIME-INR  TYPE AND SCREEN  ABO/RH    Imaging Review Ct Abdomen Pelvis W Contrast  03/31/2014   CLINICAL DATA:  Left lower quadrant pain since 12 noon today. Rectal bleeding. History of diverticulitis. History of breast cancer. Previous hysterectomy, appendectomy, lumpectomy of the breast.  EXAM: CT ABDOMEN AND PELVIS WITH CONTRAST  TECHNIQUE: Multidetector CT imaging of the abdomen and pelvis was performed using the standard protocol following bolus administration of intravenous contrast.  CONTRAST:  87mL OMNIPAQUE IOHEXOL 300 MG/ML  SOLN  COMPARISON:  None.  FINDINGS: Atelectasis in the lung bases. Small pericardial effusion. Coronary artery calcifications.  Gallbladder is  diffusely distended with mild wall thickening. No stones or infiltration identified. Mild intra and extrahepatic bile duct dilatation. No obstructing stone or lesion identified. There appears to be a lipoma in the head of the pancreas. Mild pancreatic ductal dilatation. Suggest follow-up with elective MRI/MRCP for further evaluation and to exclude occult obstructing lesion. Sub cm circumscribed low-attenuation lesion in the lateral segment left lobe of the liver is too small to characterize but likely represents a cyst or hemangioma. The spleen, adrenal glands, inferior vena cava, and retroperitoneal lymph nodes are unremarkable. Diffuse calcification of the abdominal aorta and branch vessels. Infrarenal abdominal aorta and origin of the right iliac artery appear focally narrowed. Vessels are patent. Sub cm lesions in both kidneys likely representing small cysts. No hydronephrosis. Stomach and small bowel appear normal. Contrast material flows through the colon without evidence of bowel obstruction. Colon is incompletely distended but there is evidence of diffuse wall thickening involving the transverse and descending colon with extension into the sigmoid region. Mild pericolonic stranding. No evidence of pneumatosis. Inferior mesenteric artery appears patent.Changes are consistent with colitis, possibly infectious or inflammatory. No free air or free fluid in the abdomen.  Pelvis: Diverticulosis of the sigmoid colon. No evidence of diverticulitis. Bladder is decompressed. Surgical absence of the uterus. No free air or free fluid in the abdomen. No pelvic mass or lymphadenopathy. Appendix is surgically absent. Degenerative changes in the spine. No destructive bone lesions.  IMPRESSION: Diffuse wall thickening of the transverse, sigmoid, and descending colon consistent with infectious or inflammatory colitis. Intra and extrahepatic bile duct dilatation, pancreatic ductal dilatation, and gallbladder distention without  obvious cause. Consider follow-up elective MRI/MRCP to exclude underlying obstructing lesion. Mild gallbladder wall thickening. Small pericardial effusion.   Electronically Signed   By: Lucienne Capers M.D.   On: 03/31/2014 23:43   Dg Chest Portable 1 View  03/31/2014   CLINICAL DATA:  Abdomen pain and hematochezia beginning at noon today.  EXAM: PORTABLE CHEST - 1 VIEW  COMPARISON:  04/25/2013  FINDINGS: There is stable right hemidiaphragm elevation. There is unchanged mild cardiomegaly. No airspace consolidation is evident. No large effusions are evident.  There is no significant interval change.  IMPRESSION: Cardiomegaly. Unchanged right hemidiaphragm elevation. No acute findings are evident   Electronically Signed   By: Andreas Newport M.D.   On: 03/31/2014 20:45     EKG Interpretation   Date/Time:  Saturday March 31 2014 20:03:10 EST Ventricular Rate:  70 PR Interval:  187 QRS Duration: 89 QT Interval:  366 QTC Calculation: 395 R Axis:   74 Text Interpretation:   Sinus rhythm Baseline wander in lead(s) III aVL No  significant change since last tracing Confirmed by Shahidah Nesbitt  MD-J, Brogen Duell  ([id number]) on 03/31/2014 8:15:09 PM     Medications  0.9 %  sodium chloride infusion (1,000 mLs Intravenous New Bag/Given 03/31/14 2031)  ciprofloxacin (CIPRO) IVPB 400 mg (not administered)  metroNIDAZOLE (FLAGYL) IVPB 500 mg (not administered)  ondansetron (ZOFRAN) injection 4 mg (4 mg Intravenous Given 03/31/14 2031)  morphine 4 MG/ML injection 4 mg (4 mg Intravenous Given 03/31/14 2031)  iohexol (OMNIPAQUE) 300 MG/ML solution 25 mL (25 mLs Oral Contrast Given 03/31/14 2133)  iohexol (OMNIPAQUE) 300 MG/ML solution 80 mL (80 mLs Intravenous Contrast Given 03/31/14 2309)     MDM   Final diagnoses:  Rectal bleeding  LLQ pain  Colitis   The patient's CT scan shows diffuse bowel wall thickening consistent with an infectious or inflammatory colitis. IV antibiotics were ordered and initiated in the  emergency department. The patient does have biliary duct dilatation on the CT scan however her bilirubin and LFTs are normal. I do not feel this is related to her abdominal pain but this will require further evaluation. I'll consult the medical service regarding admission for further evaluation.    Dorie Rank, MD 03/31/14 2352

## 2014-03-31 NOTE — ED Notes (Signed)
Patient transported to CT 

## 2014-04-01 DIAGNOSIS — K5731 Diverticulosis of large intestine without perforation or abscess with bleeding: Secondary | ICD-10-CM | POA: Diagnosis not present

## 2014-04-01 DIAGNOSIS — R109 Unspecified abdominal pain: Secondary | ICD-10-CM | POA: Diagnosis not present

## 2014-04-01 DIAGNOSIS — Z9842 Cataract extraction status, left eye: Secondary | ICD-10-CM | POA: Diagnosis not present

## 2014-04-01 DIAGNOSIS — Z96652 Presence of left artificial knee joint: Secondary | ICD-10-CM | POA: Diagnosis present

## 2014-04-01 DIAGNOSIS — M179 Osteoarthritis of knee, unspecified: Secondary | ICD-10-CM

## 2014-04-01 DIAGNOSIS — Z853 Personal history of malignant neoplasm of breast: Secondary | ICD-10-CM | POA: Diagnosis not present

## 2014-04-01 DIAGNOSIS — K529 Noninfective gastroenteritis and colitis, unspecified: Secondary | ICD-10-CM | POA: Diagnosis not present

## 2014-04-01 DIAGNOSIS — K868 Other specified diseases of pancreas: Secondary | ICD-10-CM | POA: Diagnosis present

## 2014-04-01 DIAGNOSIS — H9190 Unspecified hearing loss, unspecified ear: Secondary | ICD-10-CM | POA: Diagnosis present

## 2014-04-01 DIAGNOSIS — K559 Vascular disorder of intestine, unspecified: Secondary | ICD-10-CM | POA: Diagnosis present

## 2014-04-01 DIAGNOSIS — K839 Disease of biliary tract, unspecified: Secondary | ICD-10-CM | POA: Diagnosis not present

## 2014-04-01 DIAGNOSIS — K838 Other specified diseases of biliary tract: Secondary | ICD-10-CM | POA: Diagnosis present

## 2014-04-01 DIAGNOSIS — D62 Acute posthemorrhagic anemia: Secondary | ICD-10-CM | POA: Diagnosis present

## 2014-04-01 DIAGNOSIS — Z90710 Acquired absence of both cervix and uterus: Secondary | ICD-10-CM | POA: Diagnosis not present

## 2014-04-01 DIAGNOSIS — K5730 Diverticulosis of large intestine without perforation or abscess without bleeding: Secondary | ICD-10-CM | POA: Diagnosis present

## 2014-04-01 DIAGNOSIS — I10 Essential (primary) hypertension: Secondary | ICD-10-CM

## 2014-04-01 DIAGNOSIS — Z7982 Long term (current) use of aspirin: Secondary | ICD-10-CM | POA: Diagnosis not present

## 2014-04-01 DIAGNOSIS — E785 Hyperlipidemia, unspecified: Secondary | ICD-10-CM | POA: Diagnosis present

## 2014-04-01 DIAGNOSIS — I5032 Chronic diastolic (congestive) heart failure: Secondary | ICD-10-CM | POA: Diagnosis present

## 2014-04-01 DIAGNOSIS — Z888 Allergy status to other drugs, medicaments and biological substances status: Secondary | ICD-10-CM | POA: Diagnosis not present

## 2014-04-01 DIAGNOSIS — K921 Melena: Secondary | ICD-10-CM | POA: Diagnosis present

## 2014-04-01 DIAGNOSIS — Z9841 Cataract extraction status, right eye: Secondary | ICD-10-CM | POA: Diagnosis not present

## 2014-04-01 DIAGNOSIS — Z961 Presence of intraocular lens: Secondary | ICD-10-CM | POA: Diagnosis present

## 2014-04-01 DIAGNOSIS — K219 Gastro-esophageal reflux disease without esophagitis: Secondary | ICD-10-CM

## 2014-04-01 LAB — CBC WITH DIFFERENTIAL/PLATELET
Basophils Absolute: 0 10*3/uL (ref 0.0–0.1)
Basophils Absolute: 0 10*3/uL (ref 0.0–0.1)
Basophils Relative: 0 % (ref 0–1)
Basophils Relative: 0 % (ref 0–1)
EOS ABS: 0 10*3/uL (ref 0.0–0.7)
Eosinophils Absolute: 0 10*3/uL (ref 0.0–0.7)
Eosinophils Relative: 0 % (ref 0–5)
Eosinophils Relative: 0 % (ref 0–5)
HCT: 31.6 % — ABNORMAL LOW (ref 36.0–46.0)
HEMATOCRIT: 34.8 % — AB (ref 36.0–46.0)
HEMOGLOBIN: 11.7 g/dL — AB (ref 12.0–15.0)
Hemoglobin: 10.3 g/dL — ABNORMAL LOW (ref 12.0–15.0)
LYMPHS ABS: 2 10*3/uL (ref 0.7–4.0)
LYMPHS ABS: 2.4 10*3/uL (ref 0.7–4.0)
Lymphocytes Relative: 26 % (ref 12–46)
Lymphocytes Relative: 22 % (ref 12–46)
MCH: 29.6 pg (ref 26.0–34.0)
MCH: 29.6 pg (ref 26.0–34.0)
MCHC: 32.6 g/dL (ref 30.0–36.0)
MCHC: 33.6 g/dL (ref 30.0–36.0)
MCV: 90.8 fL (ref 78.0–100.0)
MCV: 88.1 fL (ref 78.0–100.0)
MONO ABS: 0.8 10*3/uL (ref 0.1–1.0)
MONO ABS: 0.8 10*3/uL (ref 0.1–1.0)
MONOS PCT: 9 % (ref 3–12)
Monocytes Relative: 9 % (ref 3–12)
NEUTROS ABS: 6.1 10*3/uL (ref 1.7–7.7)
NEUTROS ABS: 6.4 10*3/uL (ref 1.7–7.7)
NEUTROS PCT: 69 % (ref 43–77)
Neutrophils Relative %: 65 % (ref 43–77)
PLATELETS: 159 10*3/uL (ref 150–400)
Platelets: 187 10*3/uL (ref 150–400)
RBC: 3.48 MIL/uL — ABNORMAL LOW (ref 3.87–5.11)
RBC: 3.95 MIL/uL (ref 3.87–5.11)
RDW: 14 % (ref 11.5–15.5)
RDW: 14 % (ref 11.5–15.5)
WBC: 9.3 10*3/uL (ref 4.0–10.5)
WBC: 9.3 10*3/uL (ref 4.0–10.5)

## 2014-04-01 LAB — COMPREHENSIVE METABOLIC PANEL
ALBUMIN: 3.2 g/dL — AB (ref 3.5–5.2)
ALT: 16 U/L (ref 0–35)
AST: 16 U/L (ref 0–37)
Alkaline Phosphatase: 61 U/L (ref 39–117)
Anion gap: 5 (ref 5–15)
BUN: 14 mg/dL (ref 6–23)
CO2: 29 mmol/L (ref 19–32)
CREATININE: 0.98 mg/dL (ref 0.50–1.10)
Calcium: 8.5 mg/dL (ref 8.4–10.5)
Chloride: 104 mmol/L (ref 96–112)
GFR calc Af Amer: 59 mL/min — ABNORMAL LOW (ref >90)
GFR, EST NON AFRICAN AMERICAN: 51 mL/min — AB (ref >90)
Glucose, Bld: 129 mg/dL — ABNORMAL HIGH (ref 70–99)
POTASSIUM: 3.6 mmol/L (ref 3.5–5.1)
Sodium: 138 mmol/L (ref 135–145)
TOTAL PROTEIN: 5.9 g/dL — AB (ref 6.0–8.3)
Total Bilirubin: 0.8 mg/dL (ref 0.3–1.2)

## 2014-04-01 LAB — CBC
HEMATOCRIT: 33.1 % — AB (ref 36.0–46.0)
HEMOGLOBIN: 11 g/dL — AB (ref 12.0–15.0)
MCH: 30.2 pg (ref 26.0–34.0)
MCHC: 33.2 g/dL (ref 30.0–36.0)
MCV: 90.9 fL (ref 78.0–100.0)
Platelets: 165 10*3/uL (ref 150–400)
RBC: 3.64 MIL/uL — ABNORMAL LOW (ref 3.87–5.11)
RDW: 14.1 % (ref 11.5–15.5)
WBC: 12.3 10*3/uL — AB (ref 4.0–10.5)

## 2014-04-01 LAB — LACTIC ACID, PLASMA: Lactic Acid, Venous: 1 mmol/L (ref 0.5–2.0)

## 2014-04-01 LAB — I-STAT CG4 LACTIC ACID, ED: Lactic Acid, Venous: 1.27 mmol/L (ref 0.5–2.0)

## 2014-04-01 LAB — ABO/RH: ABO/RH(D): O POS

## 2014-04-01 MED ORDER — MORPHINE SULFATE 2 MG/ML IJ SOLN
2.0000 mg | INTRAMUSCULAR | Status: DC | PRN
  Administered 2014-04-01 (×4): 2 mg via INTRAVENOUS
  Filled 2014-04-01 (×4): qty 1

## 2014-04-01 MED ORDER — ONDANSETRON HCL 4 MG PO TABS: 4.0000 mg | ORAL_TABLET | Freq: Four times a day (QID) | ORAL | Status: DC | PRN

## 2014-04-01 MED ORDER — ONDANSETRON HCL 4 MG/2ML IJ SOLN
4.0000 mg | Freq: Four times a day (QID) | INTRAMUSCULAR | Status: DC | PRN
  Administered 2014-04-04: 4 mg via INTRAVENOUS
  Filled 2014-04-01: qty 2

## 2014-04-01 MED ORDER — SODIUM CHLORIDE 0.9 % IV SOLN
INTRAVENOUS | Status: DC
  Administered 2014-04-01: 03:00:00 via INTRAVENOUS

## 2014-04-01 MED ORDER — ACETAMINOPHEN 325 MG PO TABS
650.0000 mg | ORAL_TABLET | Freq: Four times a day (QID) | ORAL | Status: DC | PRN
  Administered 2014-04-05: 650 mg via ORAL
  Filled 2014-04-01: qty 2

## 2014-04-01 MED ORDER — ACETAMINOPHEN 500 MG PO TABS: 500.0000 mg | ORAL_TABLET | Freq: Four times a day (QID) | ORAL | Status: DC | PRN

## 2014-04-01 MED ORDER — ASPIRIN 81 MG PO CHEW
81.0000 mg | CHEWABLE_TABLET | Freq: Every day | ORAL | Status: DC
  Filled 2014-04-01 (×2): qty 1

## 2014-04-01 MED ORDER — SODIUM CHLORIDE 0.9 % IJ SOLN
3.0000 mL | Freq: Two times a day (BID) | INTRAMUSCULAR | Status: DC
Start: 2014-04-01 — End: 2014-04-06
  Administered 2014-04-02 – 2014-04-05 (×6): 3 mL via INTRAVENOUS

## 2014-04-01 MED ORDER — SIMVASTATIN 20 MG PO TABS
20.0000 mg | ORAL_TABLET | Freq: Every day | ORAL | Status: DC
  Administered 2014-04-01 – 2014-04-05 (×5): 20 mg via ORAL
  Filled 2014-04-01 (×6): qty 1

## 2014-04-01 MED ORDER — ACETAMINOPHEN 650 MG RE SUPP: 650.0000 mg | Freq: Four times a day (QID) | RECTAL | Status: DC | PRN

## 2014-04-01 MED ORDER — TRAZODONE HCL 100 MG PO TABS
100.0000 mg | ORAL_TABLET | Freq: Every day | ORAL | Status: DC
  Administered 2014-04-01 – 2014-04-05 (×5): 100 mg via ORAL
  Filled 2014-04-01 (×6): qty 1

## 2014-04-01 MED ORDER — SODIUM CHLORIDE 0.9 % IV SOLN
1000.0000 mL | INTRAVENOUS | Status: DC
  Administered 2014-04-01: 1000 mL via INTRAVENOUS

## 2014-04-01 MED ORDER — CIPROFLOXACIN IN D5W 400 MG/200ML IV SOLN
400.0000 mg | Freq: Two times a day (BID) | INTRAVENOUS | Status: DC
  Administered 2014-04-01 – 2014-04-05 (×10): 400 mg via INTRAVENOUS
  Filled 2014-04-01 (×9): qty 200

## 2014-04-01 MED ORDER — PANTOPRAZOLE SODIUM 40 MG IV SOLR
40.0000 mg | Freq: Two times a day (BID) | INTRAVENOUS | Status: DC
  Administered 2014-04-01 – 2014-04-05 (×9): 40 mg via INTRAVENOUS
  Filled 2014-04-01 (×10): qty 40

## 2014-04-01 MED ORDER — METRONIDAZOLE IN NACL 5-0.79 MG/ML-% IV SOLN
500.0000 mg | Freq: Three times a day (TID) | INTRAVENOUS | Status: DC
  Administered 2014-04-01 – 2014-04-05 (×14): 500 mg via INTRAVENOUS
  Filled 2014-04-01 (×14): qty 100

## 2014-04-01 NOTE — Evaluation (Signed)
Physical Therapy Evaluation Patient Details Name: Jlyn [place] Walker MRN: [id number] DOB: Aug 19, 1926 Today's Date: 04/01/2014   History of Present Illness  Pt admitted 03-31-14 with colitis.  Clinical Impression  Pt admitted with above diagnosis. Pt currently with functional limitations due to the deficits listed below (see PT Problem List).  Pt will benefit from skilled PT to increase their independence and safety with mobility to allow discharge to the venue listed below.       Follow Up Recommendations No PT follow up;Supervision/Assistance - 24 hour    Equipment Recommendations  None recommended by PT    Recommendations for Other Services       Precautions / Restrictions Precautions Precautions: Fall      Mobility  Bed Mobility Overal bed mobility: Modified Independent                Transfers Overall transfer level: Needs assistance Equipment used: None Transfers: Sit to/from [employer] Transfers Sit to Stand: Min guard Stand pivot transfers: Min guard       General transfer comment: retropulsive with initial stance  Ambulation/Gait Ambulation/Gait assistance: Min guard Ambulation Distance (Feet): 50 Feet Assistive device: None Gait Pattern/deviations: Decreased stride length Gait velocity: decreased      Stairs            Wheelchair Mobility    Modified Rankin (Stroke Patients Only)       Balance                                             Pertinent Vitals/Pain Pain Assessment: 0-10 Pain Score: 2  Pain Location: abdomen Pain Intervention(s): Monitored during session    Home Living Family/patient expects to be discharged to:: Private residence Living Arrangements: Alone Available Help at Discharge: Family;Available 24 hours/day Type of Home: House Home Access: Stairs to enter   [employer] of Steps: 2 Home Layout: One level Home Equipment: Walker - 2 wheels;Bedside commode      Prior  Function Level of Independence: Independent               Hand Dominance   Dominant Hand: Right    Extremity/Trunk Assessment   Upper Extremity Assessment: Overall WFL for tasks assessed           Lower Extremity Assessment: Generalized weakness;LLE deficits/detail   LLE Deficits / Details: limited ROM left knee from TKA 04/2013.  Cervical / Trunk Assessment: Normal  Communication   Communication: No difficulties  Cognition Arousal/Alertness: Awake/alert Behavior During Therapy: WFL for tasks assessed/performed Overall Cognitive Status: Within Functional Limits for tasks assessed                      General Comments      Exercises        Assessment/Plan    PT Assessment Patient needs continued PT services  PT Diagnosis Generalized weakness;Difficulty walking;Acute pain   PT Problem List Decreased activity tolerance;Decreased balance;Decreased strength;Decreased mobility;Pain  PT Treatment Interventions Gait training;Stair training;Functional mobility training;Therapeutic activities;Therapeutic exercise;Patient/family education;Balance training   PT Goals (Current goals can be found in the Care Plan section) Acute Rehab PT Goals Patient Stated Goal: independence PT Goal Formulation: With patient Time For Goal Achievement: 04/15/14 Potential to Achieve Goals: Good    Frequency Min 3X/week   Barriers to discharge        Co-evaluation  End of Session Equipment Utilized During Treatment: Gait belt Activity Tolerance: Patient tolerated treatment well Patient left: in bed;with family/visitor present;with call bell/phone within reach Nurse Communication: Mobility status         Time: 0321-2248 PT Time Calculation (min) (ACUTE ONLY): 18 min   Charges:   PT Evaluation $Initial PT Evaluation Tier I: 1 Procedure PT Treatments $Gait Training: 8-22 mins   PT G Codes:        Lorriane Shire 04/01/2014, 2:56 PM

## 2014-04-01 NOTE — Progress Notes (Signed)
81yo female c/o abdominal pain since noon then had BM w/ blood, CT reveals infectious or inflammatory colitis, to begin IV ABX.  Will start Cipro 400mg  IV Q12H for CrCl ~30 ml/min and monitor CBC, CrCl, Cx.  Wynona Neat, PharmD, BCPS 04/01/2014 1:03 AM

## 2014-04-01 NOTE — Progress Notes (Signed)
[facility] PROGRESS NOTE  [name] Morton WJX:[id number] DOB: [birth year] DOA: 03/31/2014  PCP: Marjorie Smolder, MD  Brief HPI: 81 year old Caucasian female presented with abdominal pain and rectal bleeding. She was found to have colitis on CT scan. She was admitted to the hospital and started on IV antibiotics.  Past medical history:  Past Medical History  Diagnosis Date  . History of nuclear stress test 11/2009    dipyridamole; no evidence of ischemia, normal, low risk   . Dyslipidemia   . Breast cancer 1997    right - tx with lumpectomty & radiation  . Hearing impairment     BILATERAL HEARING AIDS  . History of shingles 1998    NO RESIDUAL PROBLEMS  . Sinus problem   . Shortness of breath     WHEN CLIMBING STEPS AT MY HOME--OTHERWISE OK  . GERD (gastroesophageal reflux disease)     RARE - NO MEDICATIONS  . Lymphedema of arm     RIGHT   . OA (osteoarthritis)     OA AND PAIN LEFT KNEE  . Diverticulosis   . Sleep difficulties     ALWAYS HAD SLEEPING PROBLEMS - MEDICATION HELPS PT SLEEP ABOUT 3 HOURS  . Hypertension     SEES CARDIOLOGIST DR. DAVID HARDING FOR HYPERTENSION     Consultants: [facility]  Procedures: None  Antibiotics: Cipro and Flagyl 1/31  Subjective: Patient feels better this morning. Hasn't had any further bleeding per rectum. Denies any abdominal pain as well. No nausea, vomiting.  Objective: Vital Signs  Filed Vitals:   04/01/14 0100 04/01/14 0145 04/01/14 0158 04/01/14 0625  BP: 138/60  128/45 115/52  Pulse: 78  74   Temp:   98 F (36.7 C) 99.2 F (37.3 C)  TempSrc:   Oral Oral  Resp: 19  12 18   Height:  5' (1.524 m)    Weight:  62.959 kg (138 lb 12.8 oz)    SpO2: 92%  95% 94%    Intake/Output Summary (Last 24 hours) at 04/01/14 2130 Last data filed at 04/01/14 8657  Gross per 24 hour  Intake 2608.75 ml  Output    150 ml  Net 2458.75 ml   Filed Weights   04/01/14 0145  Weight: 62.959 kg (138 lb 12.8 oz)      General appearance: alert, cooperative, appears stated age and no distress Resp: clear to auscultation bilaterally Cardio: regular rate and rhythm, S1, S2 normal, no murmur, click, rub or gallop GI: soft, non-tender; bowel sounds normal; no masses,  no organomegaly Extremities: extremities normal, atraumatic, no cyanosis or edema Neurologic: alert and oriented x 3. No focal deficits.  Lab Results:  Basic Metabolic Panel:  Recent Labs Lab 03/31/14 1937 04/01/14 0616  NA 139 138  K 3.7 3.6  CL 101 104  CO2 27 29  GLUCOSE 137* 129*  BUN 20 14  CREATININE 1.19* 0.98  CALCIUM 9.9 8.5   Liver Function Tests:  Recent Labs Lab 03/31/14 1937 04/01/14 0616  AST 24 16  ALT 20 16  ALKPHOS 87 61  BILITOT 0.6 0.8  PROT 7.7 5.9*  ALBUMIN 4.4 3.2*    Recent Labs Lab 03/31/14 1937  LIPASE 32   CBC:  Recent Labs Lab 03/31/14 1937 04/01/14 0052 04/01/14 0616  WBC 11.9* 9.3 9.3  NEUTROABS 9.1* 6.1 6.4  HGB 14.0 10.3* 11.7*  HCT 40.9 31.6* 34.8*  MCV 86.8 90.8 88.1  PLT 230 159 187     Studies/Results: Ct Abdomen Pelvis  W Contrast  03/31/2014   CLINICAL DATA:  Left lower quadrant pain since 12 noon today. Rectal bleeding. History of diverticulitis. History of breast cancer. Previous hysterectomy, appendectomy, lumpectomy of the breast.  EXAM: CT ABDOMEN AND PELVIS WITH CONTRAST  TECHNIQUE: Multidetector CT imaging of the abdomen and pelvis was performed using the standard protocol following bolus administration of intravenous contrast.  CONTRAST:  77mL OMNIPAQUE IOHEXOL 300 MG/ML  SOLN  COMPARISON:  None.  FINDINGS: Atelectasis in the lung bases. Small pericardial effusion. Coronary artery calcifications.  Gallbladder is diffusely distended with mild wall thickening. No stones or infiltration identified. Mild intra and extrahepatic bile duct dilatation. No obstructing stone or lesion identified. There appears to be a lipoma in the head of the pancreas. Mild pancreatic  ductal dilatation. Suggest follow-up with elective MRI/MRCP for further evaluation and to exclude occult obstructing lesion. Sub cm circumscribed low-attenuation lesion in the lateral segment left lobe of the liver is too small to characterize but likely represents a cyst or hemangioma. The spleen, adrenal glands, inferior vena cava, and retroperitoneal lymph nodes are unremarkable. Diffuse calcification of the abdominal aorta and branch vessels. Infrarenal abdominal aorta and origin of the right iliac artery appear focally narrowed. Vessels are patent. Sub cm lesions in both kidneys likely representing small cysts. No hydronephrosis. Stomach and small bowel appear normal. Contrast material flows through the colon without evidence of bowel obstruction. Colon is incompletely distended but there is evidence of diffuse wall thickening involving the transverse and descending colon with extension into the sigmoid region. Mild pericolonic stranding. No evidence of pneumatosis. Inferior mesenteric artery appears patent.Changes are consistent with colitis, possibly infectious or inflammatory. No free air or free fluid in the abdomen.  Pelvis: Diverticulosis of the sigmoid colon. No evidence of diverticulitis. Bladder is decompressed. Surgical absence of the uterus. No free air or free fluid in the abdomen. No pelvic mass or lymphadenopathy. Appendix is surgically absent. Degenerative changes in the spine. No destructive bone lesions.  IMPRESSION: Diffuse wall thickening of the transverse, sigmoid, and descending colon consistent with infectious or inflammatory colitis. Intra and extrahepatic bile duct dilatation, pancreatic ductal dilatation, and gallbladder distention without obvious cause. Consider follow-up elective MRI/MRCP to exclude underlying obstructing lesion. Mild gallbladder wall thickening. Small pericardial effusion.   Electronically Signed   By: Lucienne Capers M.D.   On: 03/31/2014 23:43   Dg Chest  Portable 1 View  03/31/2014   CLINICAL DATA:  Abdomen pain and hematochezia beginning at noon today.  EXAM: PORTABLE CHEST - 1 VIEW  COMPARISON:  04/25/2013  FINDINGS: There is stable right hemidiaphragm elevation. There is unchanged mild cardiomegaly. No airspace consolidation is evident. No large effusions are evident.  There is no significant interval change.  IMPRESSION: Cardiomegaly. Unchanged right hemidiaphragm elevation. No acute findings are evident   Electronically Signed   By: Andreas Newport M.D.   On: 03/31/2014 20:45    Medications:  Scheduled: . ciprofloxacin  400 mg Intravenous Q12H  . metronidazole  500 mg Intravenous Q8H  . pantoprazole (PROTONIX) IV  40 mg Intravenous Q12H  . simvastatin  20 mg Oral QHS  . sodium chloride  3 mL Intravenous Q12H  . traZODone  100 mg Oral QHS   Continuous: . sodium chloride 1,000 mL (04/01/14 0943)   WGY:[id number] **OR** acetaminophen, acetaminophen, morphine injection, ondansetron **OR** ondansetron (ZOFRAN) IV  Assessment/Plan:  Principal Problem:   Colitis Active Problems:   Hypertension, essential, benign   OA (osteoarthritis) of knee   GERD (  gastroesophageal reflux disease)   Diverticulosis of large intestine   Diastolic dysfunction with chronic heart failure    Colitis CT scans shows pancolitis, suggesting infectious or inflammatory etiology. Continue with intravenous antibiotics for now. She is not bleeding any further. Monitor CBC closely. She reports a colonoscopy within the last 3 years, which revealed diverticulosis per patient. No reports found in epic. There is a pathology report of adenomatous polyps from 2014. GI has been notified. Don't anticipate any procedures unless she has recurrence of bleeding. She denies any recent travel. Denies eating out recently. Denies eating any unusual food recently. Okay to start clear liquids.  Essential Hypertension Blood pressure stable. Continue to hold her oral agents for  now.  Chronic diastolic heart failure. Well compensated. Continue to monitor.   History of GERD. Continue Protonix.  Biliary ductal dilatation This was noted incidentally on her CT scan. LFTs are normal. Alkaline phosphatase is normal. He'll appreciate GI input in this matter. Can likely be pursued as an outpatient.  DVT Prophylaxis: SCDs    Code Status: Full code  Family Communication: Discussed with the patient and her niece  Disposition Plan: Mobilize. Not ready for discharge.    LOS: 1 day   [facility] Pager [phone] 04/01/2014, 9:37 AM  If 7PM-7AM, please contact night-coverage at [email or web address], password [facility]

## 2014-04-01 NOTE — Consult Note (Signed)
Subjective:   HPI  The patient is an 81 year old female with multiple medical problems who was admitted to the hospital because of generalized abdominal pain but mostly in the mid and lower abdomen which began yesterday associated with some stools and mostly blood per rectum. She had a CT scan of the abdomen and pelvis which showed evidence of colitis in the transverse, descending, and sigmoid colon. This could be infectious or ischemic in nature. The patient feels better today. The CT scan also showed dilatation of the biliary tree, but LFTs are normal. She is not having any right upper quadrant abdominal pain to suggest biliary colic.  Review of Systems Denies chest pain or shortness of breath.  Past Medical History  Diagnosis Date  . History of nuclear stress test 11/2009    dipyridamole; no evidence of ischemia, normal, low risk   . Dyslipidemia   . Breast cancer 1997    right - tx with lumpectomty & radiation  . Hearing impairment     BILATERAL HEARING AIDS  . History of shingles 1998    NO RESIDUAL PROBLEMS  . Sinus problem   . Shortness of breath     WHEN CLIMBING STEPS AT MY HOME--OTHERWISE OK  . GERD (gastroesophageal reflux disease)     RARE - NO MEDICATIONS  . Lymphedema of arm     RIGHT   . OA (osteoarthritis)     OA AND PAIN LEFT KNEE  . Diverticulosis   . Sleep difficulties     ALWAYS HAD SLEEPING PROBLEMS - MEDICATION HELPS PT SLEEP ABOUT 3 HOURS  . Hypertension     SEES CARDIOLOGIST DR. DAVID HARDING FOR HYPERTENSION    Past Surgical History  Procedure Laterality Date  . Replacement total knee  2002  . Rotator cuff repair Right 12/2009    Dr. Lenna Sciara. [place]   . Appendectomy  1951  . Abdominal hysterectomy  1973  . Transthoracic echocardiogram  05/1994    trace mitral/tricuspic/aortic inusff; hyperkinetic LV  . Eye surgery      BILATERAL CATARACT EXTRACTIONS AND LENS IMPLANTS  . Breast lumpectomy Right 1997  . Breast adenoma  1950    EXCISED  . Total knee  arthroplasty Left 05/01/2013    Procedure: LEFT TOTAL KNEE ARTHROPLASTY;  Surgeon: Gearlean Alf, MD;  Location: [facility];  Service: Orthopedics;  Laterality: Left;   History   Social History  . Marital Status: Widowed    Spouse Name: N/A    Number of Children: 2  . Years of Education: N/A   Occupational History  . retired Therapist, sports    Social History Main Topics  . Smoking status: Never Smoker   . Smokeless tobacco: Never Used  . Alcohol Use: No  . Drug Use: No  . Sexual Activity: Not on file   Other Topics Concern  . Not on file   Social History Narrative   Widowed since 11/2010.  Mother of 2.  No real exercise due to knee.   Smoki: No   EtOH: NO   family history includes Arthritis in her child; Asthma in her maternal grandfather; Cancer in her sister; Emphysema in her sister; Heart Problems in her father, maternal grandfather, maternal grandmother, and sister; Heart attack in her mother; Heart attack (age of onset: 57) in her brother; Heart disease in her brother; Lung cancer in her sister; Narcolepsy in her child.  Current facility-administered medications:  .  0.9 %  sodium chloride infusion, 1,000 mL, Intravenous, Continuous, Bonnielee Haff,  MD, Last Rate: 75 mL/hr at 04/01/14 0943, 1,000 mL at 04/01/14 0943 .  acetaminophen (TYLENOL) tablet 650 mg, 650 mg, Oral, Q6H PRN **OR** acetaminophen (TYLENOL) suppository 650 mg, 650 mg, Rectal, Q6H PRN, Berle Mull, MD .  acetaminophen (TYLENOL) tablet 500 mg, 500 mg, Oral, Q6H PRN, Berle Mull, MD .  ciprofloxacin (CIPRO) IVPB 400 mg, 400 mg, Intravenous, Q12H, Rogue Bussing, RPH, Last Rate: 200 mL/hr at 04/01/14 1158, 400 mg at 04/01/14 1158 .  metroNIDAZOLE (FLAGYL) IVPB 500 mg, 500 mg, Intravenous, Q8H, Berle Mull, MD, Last Rate: 100 mL/hr at 04/01/14 0812, 500 mg at 04/01/14 0812 .  morphine 2 MG/ML injection 2 mg, 2 mg, Intravenous, Q3H PRN, Berle Mull, MD, 2 mg at 04/01/14 1140 .  ondansetron (ZOFRAN) tablet 4 mg, 4 mg,  Oral, Q6H PRN **OR** ondansetron (ZOFRAN) injection 4 mg, 4 mg, Intravenous, Q6H PRN, Berle Mull, MD .  pantoprazole (PROTONIX) injection 40 mg, 40 mg, Intravenous, Q12H, Berle Mull, MD, 40 mg at 04/01/14 0943 .  simvastatin (ZOCOR) tablet 20 mg, 20 mg, Oral, QHS, Berle Mull, MD .  sodium chloride 0.9 % injection 3 mL, 3 mL, Intravenous, Q12H, Berle Mull, MD, 3 mL at 04/01/14 0100 .  traZODone (DESYREL) tablet 100 mg, 100 mg, Oral, QHS, Berle Mull, MD, 100 mg at 04/01/14 0230 Allergies  Allergen Reactions  . Benadryl [Diphenhydramine Hcl (Sleep)]     Makes me hyper   . Cardizem [Diltiazem Hcl]     Doesn't remember. Dr Rex Kras told patient not to take.   [name] Kitchen Epinephrine     unknown  . Vistaril [Hydroxyzine]     unknown  . Vytorin [Ezetimibe-Simvastatin]     Not comfortable taking it. Doesn't remember reaction.      Objective:     BP 115/52 mmHg  Pulse 74  Temp(Src) 99.2 F (37.3 C) (Oral)  Resp 18  Ht 5' (1.524 m)  Wt 62.959 kg (138 lb 12.8 oz)  BMI 27.11 kg/m2  SpO2 94%  She is in no distress  Nonicteric  Heart regular rhythm no murmurs  Lungs clear  Abdomen: Bowel sounds present, soft, mild generalized discomfort to palpation but no rebound  Laboratory No components found for: D1    Assessment:     Colitis. This is probably ischemic versus infectious in nature.  Dilated biliary tree of uncertain etiology with normal LFTs      Plan:     Treat colitis conservatively. Antibiotic therapy and IV fluids. Check stool for enteric pathogens. Follow clinically  Consider MRCP to evaluate dilated biliary tree the etiology of which is unclear. Lab Results  Component Value Date   HGB 11.7* 04/01/2014   HGB 10.3* 04/01/2014   HGB 14.0 03/31/2014   HCT 34.8* 04/01/2014   HCT 31.6* 04/01/2014   HCT 40.9 03/31/2014   ALKPHOS 61 04/01/2014   ALKPHOS 87 03/31/2014   ALKPHOS 71 04/25/2013   AST 16 04/01/2014   AST 24 03/31/2014   AST 18 04/25/2013   ALT 16  04/01/2014   ALT 20 03/31/2014   ALT 15 04/25/2013

## 2014-04-01 NOTE — H&P (Signed)
[facility] History and Physical  Patient: [name] Morton  MRN: [id number]  DOB: 1927-01-27  DOS: the patient was seen and examined on 04/01/2014 PCP: Marjorie Smolder, MD  Chief Complaint: Abdominal pain and bright red blood per rectum  HPI: [name] is a 81 y.o. female with Past medical history of hypertension, breast cancer, GERD, diverticulosis, chronic diastolic heart failure. The patient is presenting with complaints of abdominal pain. She mentions that at around known she started with abdominal pain which was located on lower abdomen and was severe and was 9 out of 10. After that she had an episode of nausea. After that 2 hours later she started having episodes of worsening of her abdominal pain in a wavelike fashion and felt like the pain was moving through her abdomen. She mentions that whenever she had a sense of worsening of the pain she had a sense of defecation as well. And whenever she went for a bowel movement she found that she had significant bright red blood with possible clots. She denies having any significant fecal material diarrhea. She denies any similar bleeding in the past. Her pain at present is tolerable 5 out of 10. She had an episode of vomiting as well. She denies any prior abdominal pain after food. Denies any recent travel, recent antibiotic use, recent changes in her medication, fever, chills, nausea at the time of my evaluation, chest pain, shortness of breath. She mentions she has history of diverticulitis that was 3 years ago. No abdominal surgery as per patient.  The patient is coming from home. And at her baseline independent for most of her ADL.  Review of Systems: as mentioned in the history of present illness.  A Comprehensive review of the other systems is negative.  Past Medical History  Diagnosis Date  . History of nuclear stress test 11/2009    dipyridamole; no evidence of ischemia, normal, low risk   . Dyslipidemia   .  Breast cancer 1997    right - tx with lumpectomty & radiation  . Hearing impairment     BILATERAL HEARING AIDS  . History of shingles 1998    NO RESIDUAL PROBLEMS  . Sinus problem   . Shortness of breath     WHEN CLIMBING STEPS AT MY HOME--OTHERWISE OK  . GERD (gastroesophageal reflux disease)     RARE - NO MEDICATIONS  . Lymphedema of arm     RIGHT   . OA (osteoarthritis)     OA AND PAIN LEFT KNEE  . Diverticulosis   . Sleep difficulties     ALWAYS HAD SLEEPING PROBLEMS - MEDICATION HELPS PT SLEEP ABOUT 3 HOURS  . Hypertension     SEES CARDIOLOGIST DR. DAVID HARDING FOR HYPERTENSION    Past Surgical History  Procedure Laterality Date  . Replacement total knee  2002  . Rotator cuff repair Right 12/2009    Dr. Lenna Sciara. [place]   . Appendectomy  1951  . Abdominal hysterectomy  1973  . Transthoracic echocardiogram  05/1994    trace mitral/tricuspic/aortic inusff; hyperkinetic LV  . Eye surgery      BILATERAL CATARACT EXTRACTIONS AND LENS IMPLANTS  . Breast lumpectomy Right 1997  . Breast adenoma  1950    EXCISED  . Total knee arthroplasty Left 05/01/2013    Procedure: LEFT TOTAL KNEE ARTHROPLASTY;  Surgeon: Gearlean Alf, MD;  Location: [facility];  Service: Orthopedics;  Laterality: Left;   Social History:  reports that she has never smoked. She  has never used smokeless tobacco. She reports that she does not drink alcohol or use illicit drugs.  Allergies  Allergen Reactions  . Benadryl [Diphenhydramine Hcl (Sleep)]     Makes me hyper   . Cardizem [Diltiazem Hcl]     Doesn't remember. Dr Rex Kras told patient not to take.   [name] Kitchen Epinephrine     unknown  . Vistaril [Hydroxyzine]     unknown  . Vytorin [Ezetimibe-Simvastatin]     Not comfortable taking it. Doesn't remember reaction.     Family History  Problem Relation Age of Onset  . Heart attack Mother   . Heart Problems Father   . Heart Problems Maternal Grandmother   . Heart Problems Maternal Grandfather   . Asthma  Maternal Grandfather   . Heart attack Brother 25  . Heart disease Brother     CABG  . Lung cancer Sister   . Emphysema Sister   . Cancer Sister   . Heart Problems Sister     also asthma  . Narcolepsy Child   . Arthritis Child     Prior to Admission medications   Medication Sig Start Date End Date Taking? Authorizing Provider  acetaminophen (TYLENOL) 500 MG tablet Take 500 mg by mouth every 6 (six) hours as needed for moderate pain.   Yes Historical Provider, MD  amLODipine-benazepril (LOTREL) 10-20 MG per capsule Take 1 capsule by mouth daily. 02/07/14  Yes Leonie Man, MD  aspirin 81 MG tablet Take 81 mg by mouth at bedtime.   Yes Historical Provider, MD  bisoprolol (ZEBETA) 5 MG tablet Take 1 tablet (5 mg total) by mouth daily. 02/01/14  Yes Leonie Man, MD  Docusate Calcium (STOOL SOFTENER PO) Take 1 tablet by mouth daily.   Yes Historical Provider, MD  furosemide (LASIX) 40 MG tablet Take 1 tablet (40 mg total) by mouth daily. 02/01/14  Yes Leonie Man, MD  polyethylene glycol [facility] / Floria Raveling) packet Take 17 g by mouth every other day.   Yes Historical Provider, MD  simvastatin (ZOCOR) 20 MG tablet Take 1 tablet (20 mg total) by mouth at bedtime. 08/16/12  Yes Leonie Man, MD  traZODone (DESYREL) 50 MG tablet Take 100 mg by mouth at bedtime.   Yes Historical Provider, MD    Physical Exam: Filed Vitals:   03/31/14 2145 03/31/14 2200 03/31/14 2215 03/31/14 2230  BP: 133/111 131/53 122/48 136/50  Pulse: 78 65 70 66  Temp:      TempSrc:      Resp: 15 25 17 17   SpO2: 92% 100% 94% 96%    General: Alert, Awake and Oriented to Time, Place and Person. Appear in mild distress Eyes: PERRL ENT: Oral Mucosa clear moist. Neck: no JVD Cardiovascular: S1 and S2 Present, no Murmur, Peripheral Pulses Present Respiratory: Bilateral Air entry equal and Decreased, Clear to Auscultation, noCrackles, no wheezes Abdomen: Bowel Sound present, Soft and left-sided tender Skin: no  Rash Extremities: Bilateral Pedal edema, no calf tenderness Neurologic: Grossly no focal neuro deficit.  Labs on Admission:  CBC:  Recent Labs Lab 03/31/14 1937  WBC 11.9*  NEUTROABS 9.1*  HGB 14.0  HCT 40.9  MCV 86.8  PLT 230    CMP     Component Value Date/Time   NA 139 03/31/2014 1937   K 3.7 03/31/2014 1937   CL 101 03/31/2014 1937   CO2 27 03/31/2014 1937   GLUCOSE 137* 03/31/2014 1937   BUN 20 03/31/2014 1937   CREATININE  1.19* 03/31/2014 1937   CALCIUM 9.9 03/31/2014 1937   PROT 7.7 03/31/2014 1937   ALBUMIN 4.4 03/31/2014 1937   AST 24 03/31/2014 1937   ALT 20 03/31/2014 1937   ALKPHOS 87 03/31/2014 1937   BILITOT 0.6 03/31/2014 1937   GFRNONAA 40* 03/31/2014 1937   GFRAA 47* 03/31/2014 1937     Recent Labs Lab 03/31/14 1937  LIPASE 32    No results for input(s): CKTOTAL, CKMB, CKMBINDEX, TROPONINI in the last 168 hours. BNP (last 3 results) No results for input(s): PROBNP in the last 8760 hours.  Radiological Exams on Admission: Ct Abdomen Pelvis W Contrast  03/31/2014   CLINICAL DATA:  Left lower quadrant pain since 12 noon today. Rectal bleeding. History of diverticulitis. History of breast cancer. Previous hysterectomy, appendectomy, lumpectomy of the breast.  EXAM: CT ABDOMEN AND PELVIS WITH CONTRAST  TECHNIQUE: Multidetector CT imaging of the abdomen and pelvis was performed using the standard protocol following bolus administration of intravenous contrast.  CONTRAST:  37mL OMNIPAQUE IOHEXOL 300 MG/ML  SOLN  COMPARISON:  None.  FINDINGS: Atelectasis in the lung bases. Small pericardial effusion. Coronary artery calcifications.  Gallbladder is diffusely distended with mild wall thickening. No stones or infiltration identified. Mild intra and extrahepatic bile duct dilatation. No obstructing stone or lesion identified. There appears to be a lipoma in the head of the pancreas. Mild pancreatic ductal dilatation. Suggest follow-up with elective MRI/MRCP  for further evaluation and to exclude occult obstructing lesion. Sub cm circumscribed low-attenuation lesion in the lateral segment left lobe of the liver is too small to characterize but likely represents a cyst or hemangioma. The spleen, adrenal glands, inferior vena cava, and retroperitoneal lymph nodes are unremarkable. Diffuse calcification of the abdominal aorta and branch vessels. Infrarenal abdominal aorta and origin of the right iliac artery appear focally narrowed. Vessels are patent. Sub cm lesions in both kidneys likely representing small cysts. No hydronephrosis. Stomach and small bowel appear normal. Contrast material flows through the colon without evidence of bowel obstruction. Colon is incompletely distended but there is evidence of diffuse wall thickening involving the transverse and descending colon with extension into the sigmoid region. Mild pericolonic stranding. No evidence of pneumatosis. Inferior mesenteric artery appears patent.Changes are consistent with colitis, possibly infectious or inflammatory. No free air or free fluid in the abdomen.  Pelvis: Diverticulosis of the sigmoid colon. No evidence of diverticulitis. Bladder is decompressed. Surgical absence of the uterus. No free air or free fluid in the abdomen. No pelvic mass or lymphadenopathy. Appendix is surgically absent. Degenerative changes in the spine. No destructive bone lesions.  IMPRESSION: Diffuse wall thickening of the transverse, sigmoid, and descending colon consistent with infectious or inflammatory colitis. Intra and extrahepatic bile duct dilatation, pancreatic ductal dilatation, and gallbladder distention without obvious cause. Consider follow-up elective MRI/MRCP to exclude underlying obstructing lesion. Mild gallbladder wall thickening. Small pericardial effusion.   Electronically Signed   By: Lucienne Capers M.D.   On: 03/31/2014 23:43   Dg Chest Portable 1 View  03/31/2014   CLINICAL DATA:  Abdomen pain and  hematochezia beginning at noon today.  EXAM: PORTABLE CHEST - 1 VIEW  COMPARISON:  04/25/2013  FINDINGS: There is stable right hemidiaphragm elevation. There is unchanged mild cardiomegaly. No airspace consolidation is evident. No large effusions are evident.  There is no significant interval change.  IMPRESSION: Cardiomegaly. Unchanged right hemidiaphragm elevation. No acute findings are evident   Electronically Signed   By: Valerie Roys.D.  On: 03/31/2014 20:45    Assessment/Plan Principal Problem:   Colitis Active Problems:   Hypertension, essential, benign   OA (osteoarthritis) of knee   GERD (gastroesophageal reflux disease)   Diverticulosis of large intestine   Diastolic dysfunction with chronic heart failure   1. Colitis The patient is presenting with complaints of abdominal pain followed by bright red blood per rectum with possible clots. She had multiple episodes of bright red per rectum. At present her pain is tolerable. She denies any nausea. She does not have any significant leukocytosis or fever. Her CT scan of the abdomen shows diffuse bowel wall thickening suggesting colitis. Her history is more likely akin to ischemic colitis. Possibility of infectious colitis cannot be ruled out. And therefore she will be treated with Cipro and Flagyl with IV hydration. We will await for GI pathogen panel. Her lactic acid level is not significantly elevated and not her abdomen is and her out of proportion, she is also hemodynamically stable. Does currently she'll be monitored on telemetry. We'll discuss with GI in morning for possible colonoscopy.  2. Hypertension. Chronic diastolic heart failure. Holding medications at present.  3. GERD. Protonix every 24.  Advance goals of care discussion: Full code   Consults: Gastroenterology in morning  DVT Prophylaxis: mechanical compression device Nutrition: Nothing by mouth except medications  Family Communication: Family  was present at bedside, opportunity was given to ask question and all questions were answered satisfactorily at the time of interview. Disposition: Admitted to inpatient in telemetry unit.  Author: Berle Mull, MD [facility] Pager: [phone] 04/01/2014, 12:55 AM    If 7PM-7AM, please contact night-coverage [email or web address] Password [email or web address]

## 2014-04-01 NOTE — Progress Notes (Signed)
NURSING PROGRESS NOTE  [name] [id number] Admission Data: 04/01/2014 3:35 AM Attending Provider: Berle Mull, MD YJE:HUDJS,HFWYO RUTH, MD Code Status: full  [name] Morton is a 81 y.o. female patient admitted from ED:  -No acute distress noted.  -No complaints of shortness of breath.  -No complaints of chest pain.   Cardiac Monitoring: Box # 18 in place. Cardiac monitor yields:normal sinus rhythm.  Blood pressure 128/45, pulse 74, temperature 98 F (36.7 C), temperature source Oral, resp. rate 12, height 5' (1.524 m), weight 62.959 kg (138 lb 12.8 oz), SpO2 95 %.   IV Fluids:  IV in place, occlusive dsg intact without redness, IV cath hand left, condition patent and no redness normal saline.   Allergies:  Benadryl; Cardizem; Epinephrine; Vistaril; and Vytorin  Past Medical History:   has a past medical history of History of nuclear stress test (11/2009); Dyslipidemia; Breast cancer (1997); Hearing impairment; History of shingles (1998); Sinus problem; Shortness of breath; GERD (gastroesophageal reflux disease); Lymphedema of arm; OA (osteoarthritis); Diverticulosis; Sleep difficulties; and Hypertension.  Past Surgical History:   has past surgical history that includes Replacement total knee (2002); Rotator cuff repair (Right, 12/2009); Appendectomy (1951); Abdominal hysterectomy (1973); transthoracic echocardiogram (05/1994); Eye surgery; Breast lumpectomy (Right, 1997); BREAST ADENOMA (1950); and Total knee arthroplasty (Left, 05/01/2013).  Social History:   reports that she has never smoked. She has never used smokeless tobacco. She reports that she does not drink alcohol or use illicit drugs.  Skin: WDL  Patient/Family oriented to room. Information packet given to patient/family. Admission inpatient armband information verified with patient/family to include name and date of birth and placed on patient arm. Side rails up x 2, fall assessment and education completed  with patient/family. Patient/family able to verbalize understanding of risk associated with falls and verbalized understanding to call for assistance before getting out of bed. Call light within reach. Patient/family able to voice and demonstrate understanding of unit orientation instructions.

## 2014-04-01 NOTE — Progress Notes (Signed)
Utilization review completed.  

## 2014-04-02 ENCOUNTER — Inpatient Hospital Stay (HOSPITAL_COMMUNITY): Payer: Medicare Other

## 2014-04-02 DIAGNOSIS — D62 Acute posthemorrhagic anemia: Secondary | ICD-10-CM | POA: Diagnosis present

## 2014-04-02 DIAGNOSIS — K838 Other specified diseases of biliary tract: Secondary | ICD-10-CM

## 2014-04-02 LAB — BASIC METABOLIC PANEL
Anion gap: 5 (ref 5–15)
BUN: 9 mg/dL (ref 6–23)
CALCIUM: 8.6 mg/dL (ref 8.4–10.5)
CO2: 24 mmol/L (ref 19–32)
CREATININE: 0.99 mg/dL (ref 0.50–1.10)
Chloride: 107 mmol/L (ref 96–112)
GFR calc Af Amer: 59 mL/min — ABNORMAL LOW (ref >90)
GFR calc non Af Amer: 51 mL/min — ABNORMAL LOW (ref >90)
Glucose, Bld: 115 mg/dL — ABNORMAL HIGH (ref 70–99)
Potassium: 3.8 mmol/L (ref 3.5–5.1)
Sodium: 136 mmol/L (ref 135–145)

## 2014-04-02 LAB — CBC
HEMATOCRIT: 30.3 % — AB (ref 36.0–46.0)
Hemoglobin: 10.2 g/dL — ABNORMAL LOW (ref 12.0–15.0)
MCH: 29.9 pg (ref 26.0–34.0)
MCHC: 33.7 g/dL (ref 30.0–36.0)
MCV: 88.9 fL (ref 78.0–100.0)
Platelets: 153 10*3/uL (ref 150–400)
RBC: 3.41 MIL/uL — AB (ref 3.87–5.11)
RDW: 14 % (ref 11.5–15.5)
WBC: 10 10*3/uL (ref 4.0–10.5)

## 2014-04-02 MED ORDER — GADOBENATE DIMEGLUMINE 529 MG/ML IV SOLN
15.0000 mL | Freq: Once | INTRAVENOUS | Status: AC | PRN
  Administered 2014-04-02: 15 mL via INTRAVENOUS

## 2014-04-02 MED ORDER — LORAZEPAM 2 MG/ML IJ SOLN: 0.5000 mg | Freq: Once | INTRAMUSCULAR | Status: AC | PRN

## 2014-04-02 MED ORDER — LORAZEPAM 0.5 MG PO TABS: 0.5000 mg | ORAL_TABLET | Freq: Once | ORAL | Status: DC

## 2014-04-02 MED ORDER — BOOST / RESOURCE BREEZE PO LIQD
1.0000 | Freq: Two times a day (BID) | ORAL | Status: DC
  Administered 2014-04-03 – 2014-04-05 (×5): 1 via ORAL

## 2014-04-02 MED ORDER — LORAZEPAM 2 MG/ML IJ SOLN
0.2500 mg | Freq: Once | INTRAMUSCULAR | Status: AC
  Administered 2014-04-02: 0.25 mg via INTRAVENOUS
  Filled 2014-04-02: qty 1

## 2014-04-02 MED ORDER — SODIUM CHLORIDE 0.9 % IV SOLN
1000.0000 mL | INTRAVENOUS | Status: DC
Start: 2014-04-02 — End: 2014-04-05
  Administered 2014-04-02 – 2014-04-05 (×3): 1000 mL via INTRAVENOUS

## 2014-04-02 NOTE — Progress Notes (Addendum)
[facility] PROGRESS NOTE  [name] Morton WLN:[id number] DOB: 1926-07-10 DOA: 03/31/2014  PCP: Marjorie Smolder, MD  Brief HPI: 81 year old Caucasian female presented with abdominal pain and rectal bleeding. She was found to have colitis on CT scan. She was admitted to the hospital and started on IV antibiotics.  Past medical history:  Past Medical History  Diagnosis Date  . History of nuclear stress test 11/2009    dipyridamole; no evidence of ischemia, normal, low risk   . Dyslipidemia   . Breast cancer 1997    right - tx with lumpectomty & radiation  . Hearing impairment     BILATERAL HEARING AIDS  . History of shingles 1998    NO RESIDUAL PROBLEMS  . Sinus problem   . Shortness of breath     WHEN CLIMBING STEPS AT MY HOME--OTHERWISE OK  . GERD (gastroesophageal reflux disease)     RARE - NO MEDICATIONS  . Lymphedema of arm     RIGHT   . OA (osteoarthritis)     OA AND PAIN LEFT KNEE  . Diverticulosis   . Sleep difficulties     ALWAYS HAD SLEEPING PROBLEMS - MEDICATION HELPS PT SLEEP ABOUT 3 HOURS  . Hypertension     SEES CARDIOLOGIST DR. DAVID HARDING FOR HYPERTENSION     Consultants: [facility]  Procedures: None  Antibiotics: Cipro and Flagyl 1/31  Subjective: Patient had another episode of rectal bleeding yesterday afternoon. However, the blood seemed more maroonish than fresh red blood. Patient thinks it could be old blood. Denies any abdominal pain. No nausea, vomiting. No further episodes since then.   Objective: Vital Signs  Filed Vitals:   04/01/14 1442 04/01/14 2102 04/02/14 0552 04/02/14 0552  BP: 130/41 133/41  136/41  Pulse: 74 73  81  Temp: 98.3 F (36.8 C) 98.3 F (36.8 C)  100.4 F (38 C)  TempSrc: Oral Oral  Oral  Resp: 24 20  19   Height:      Weight:   66 kg (145 lb 8.1 oz)   SpO2: 90% 93%  95%    Intake/Output Summary (Last 24 hours) at 04/02/14 1016 Last data filed at 04/02/14 7408  Gross per 24 hour  Intake    805 ml    Output    650 ml  Net    155 ml   Filed Weights   04/01/14 0145 04/02/14 0552  Weight: 62.959 kg (138 lb 12.8 oz) 66 kg (145 lb 8.1 oz)     General appearance: alert, cooperative, appears stated age and no distress Resp: clear to auscultation bilaterally Cardio: regular rate and rhythm, S1, S2 normal, no murmur, click, rub or gallop GI: soft, non-tender; bowel sounds normal; no masses,  no organomegaly Extremities: extremities normal, atraumatic, no cyanosis or edema Neurologic: alert and oriented x 3. No focal deficits.  Lab Results:  Basic Metabolic Panel:  Recent Labs Lab 03/31/14 1937 04/01/14 0616 04/02/14 0659  NA 139 138 136  K 3.7 3.6 3.8  CL 101 104 107  CO2 27 29 24   GLUCOSE 137* 129* 115*  BUN 20 14 9   CREATININE 1.19* 0.98 0.99  CALCIUM 9.9 8.5 8.6   Liver Function Tests:  Recent Labs Lab 03/31/14 1937 04/01/14 0616  AST 24 16  ALT 20 16  ALKPHOS 87 61  BILITOT 0.6 0.8  PROT 7.7 5.9*  ALBUMIN 4.4 3.2*    Recent Labs Lab 03/31/14 1937  LIPASE 32   CBC:  Recent Labs Lab  03/31/14 1937 04/01/14 0052 04/01/14 0616 04/01/14 1629 04/02/14 0659  WBC 11.9* 9.3 9.3 12.3* 10.0  NEUTROABS 9.1* 6.1 6.4  --   --   HGB 14.0 10.3* 11.7* 11.0* 10.2*  HCT 40.9 31.6* 34.8* 33.1* 30.3*  MCV 86.8 90.8 88.1 90.9 88.9  PLT 230 159 187 165 153     Studies/Results: Ct Abdomen Pelvis W Contrast  03/31/2014   CLINICAL DATA:  Left lower quadrant pain since 12 noon today. Rectal bleeding. History of diverticulitis. History of breast cancer. Previous hysterectomy, appendectomy, lumpectomy of the breast.  EXAM: CT ABDOMEN AND PELVIS WITH CONTRAST  TECHNIQUE: Multidetector CT imaging of the abdomen and pelvis was performed using the standard protocol following bolus administration of intravenous contrast.  CONTRAST:  38mL OMNIPAQUE IOHEXOL 300 MG/ML  SOLN  COMPARISON:  None.  FINDINGS: Atelectasis in the lung bases. Small pericardial effusion. Coronary artery  calcifications.  Gallbladder is diffusely distended with mild wall thickening. No stones or infiltration identified. Mild intra and extrahepatic bile duct dilatation. No obstructing stone or lesion identified. There appears to be a lipoma in the head of the pancreas. Mild pancreatic ductal dilatation. Suggest follow-up with elective MRI/MRCP for further evaluation and to exclude occult obstructing lesion. Sub cm circumscribed low-attenuation lesion in the lateral segment left lobe of the liver is too small to characterize but likely represents a cyst or hemangioma. The spleen, adrenal glands, inferior vena cava, and retroperitoneal lymph nodes are unremarkable. Diffuse calcification of the abdominal aorta and branch vessels. Infrarenal abdominal aorta and origin of the right iliac artery appear focally narrowed. Vessels are patent. Sub cm lesions in both kidneys likely representing small cysts. No hydronephrosis. Stomach and small bowel appear normal. Contrast material flows through the colon without evidence of bowel obstruction. Colon is incompletely distended but there is evidence of diffuse wall thickening involving the transverse and descending colon with extension into the sigmoid region. Mild pericolonic stranding. No evidence of pneumatosis. Inferior mesenteric artery appears patent.Changes are consistent with colitis, possibly infectious or inflammatory. No free air or free fluid in the abdomen.  Pelvis: Diverticulosis of the sigmoid colon. No evidence of diverticulitis. Bladder is decompressed. Surgical absence of the uterus. No free air or free fluid in the abdomen. No pelvic mass or lymphadenopathy. Appendix is surgically absent. Degenerative changes in the spine. No destructive bone lesions.  IMPRESSION: Diffuse wall thickening of the transverse, sigmoid, and descending colon consistent with infectious or inflammatory colitis. Intra and extrahepatic bile duct dilatation, pancreatic ductal dilatation,  and gallbladder distention without obvious cause. Consider follow-up elective MRI/MRCP to exclude underlying obstructing lesion. Mild gallbladder wall thickening. Small pericardial effusion.   Electronically Signed   By: Lucienne Capers M.D.   On: 03/31/2014 23:43   Dg Chest Portable 1 View  03/31/2014   CLINICAL DATA:  Abdomen pain and hematochezia beginning at noon today.  EXAM: PORTABLE CHEST - 1 VIEW  COMPARISON:  04/25/2013  FINDINGS: There is stable right hemidiaphragm elevation. There is unchanged mild cardiomegaly. No airspace consolidation is evident. No large effusions are evident.  There is no significant interval change.  IMPRESSION: Cardiomegaly. Unchanged right hemidiaphragm elevation. No acute findings are evident   Electronically Signed   By: Andreas Newport M.D.   On: 03/31/2014 20:45    Medications:  Scheduled: . ciprofloxacin  400 mg Intravenous Q12H  . metronidazole  500 mg Intravenous Q8H  . pantoprazole (PROTONIX) IV  40 mg Intravenous Q12H  . simvastatin  20 mg Oral QHS  .  sodium chloride  3 mL Intravenous Q12H  . traZODone  100 mg Oral QHS   Continuous: . sodium chloride 1,000 mL (04/01/14 2035)   XEN:[id number] **OR** acetaminophen, acetaminophen, morphine injection, ondansetron **OR** ondansetron (ZOFRAN) IV  Assessment/Plan:  Principal Problem:   Colitis Active Problems:   Hypertension, essential, benign   OA (osteoarthritis) of knee   GERD (gastroesophageal reflux disease)   Diverticulosis of large intestine   Diastolic dysfunction with chronic heart failure    Colitis with hematochezia CT scans shows pancolitis, suggesting infectious or inflammatory etiology. Would also be ischemic. Had another episode of rectal bleeding yesterday afternoon, which could have been ordered blood. Hemoglobin remains stable. Continue IV antibiotics for now. She reports a colonoscopy within the last 3 years, which revealed diverticulosis per patient. No reports found in  epic. There is a pathology report of adenomatous polyps from 2014. GI is following. Don't anticipate any procedures unless she has continued bleeding. She denies any recent travel. Denies eating out recently. Denies eating any unusual food recently. Continue clear liquids.  Acute blood loss anemia Hemoglobin is lower but still stable. No need for transfusion.  Essential Hypertension Blood pressure stable. Continue to hold her oral agents for now.  Chronic diastolic heart failure. Well compensated. Continue to monitor.   History of GERD. Continue Protonix.  Biliary ductal dilatation This was noted incidentally on her CT scan. LFTs are normal. Alkaline phosphatase is normal. GI recommended MRCP, which has been ordered.   DVT Prophylaxis: SCDs    Code Status: Full code  Family Communication: Discussed with the patient and her niece  Disposition Plan: Mobilize. Not ready for discharge.    LOS: 2 days   [facility] Pager [phone] 04/02/2014, 10:16 AM  If 7PM-7AM, please contact night-coverage at [email or web address], password [facility]

## 2014-04-02 NOTE — Progress Notes (Signed)
[facility] Progress Note  Subjective:  Abdominal pain and diarrhea improving did have 1 stool containing visible blood  Objective: Vital signs in last 24 hours: Temp:  [98.3 F (36.8 C)-100.4 F (38 C)] 100.4 F (38 C) (02/01 0552) Pulse Rate:  [73-81] 81 (02/01 0552) Resp:  [19-24] 19 (02/01 0552) BP: (130-136)/(41) 136/41 mmHg (02/01 0552) SpO2:  [90 %-95 %] 95 % (02/01 0552) Weight:  [66 kg (145 lb 8.1 oz)] 66 kg (145 lb 8.1 oz) (02/01 0552) Weight change: 3.041 kg (6 lb 11.3 oz)   PE: Abdomen soft slightly distended nontender  Lab Results: Results for orders placed or performed during the hospital encounter of 03/31/14 (from the past 24 hour(s))  CBC     Status: Abnormal   Collection Time: 04/01/14  4:29 PM  Result Value Ref Range   WBC 12.3 (H) 4.0 - 10.5 K/uL   RBC 3.64 (L) 3.87 - 5.11 MIL/uL   Hemoglobin 11.0 (L) 12.0 - 15.0 g/dL   HCT 33.1 (L) 36.0 - 46.0 %   MCV 90.9 78.0 - 100.0 fL   MCH 30.2 26.0 - 34.0 pg   MCHC 33.2 30.0 - 36.0 g/dL   RDW 14.1 11.5 - 15.5 %   Platelets 165 150 - 400 K/uL  CBC     Status: Abnormal   Collection Time: 04/02/14  6:59 AM  Result Value Ref Range   WBC 10.0 4.0 - 10.5 K/uL   RBC 3.41 (L) 3.87 - 5.11 MIL/uL   Hemoglobin 10.2 (L) 12.0 - 15.0 g/dL   HCT 30.3 (L) 36.0 - 46.0 %   MCV 88.9 78.0 - 100.0 fL   MCH 29.9 26.0 - 34.0 pg   MCHC 33.7 30.0 - 36.0 g/dL   RDW 14.0 11.5 - 15.5 %   Platelets 153 150 - 400 K/uL  Basic metabolic panel     Status: Abnormal   Collection Time: 04/02/14  6:59 AM  Result Value Ref Range   Sodium 136 135 - 145 mmol/L   Potassium 3.8 3.5 - 5.1 mmol/L   Chloride 107 96 - 112 mmol/L   CO2 24 19 - 32 mmol/L   Glucose, Bld 115 (H) 70 - 99 mg/dL   BUN 9 6 - 23 mg/dL   Creatinine, Ser 0.99 0.50 - 1.10 mg/dL   Calcium 8.6 8.4 - 10.5 mg/dL   GFR calc non Af Amer 51 (L) >90 mL/min   GFR calc Af Amer 59 (L) >90 mL/min   Anion gap 5 5 - 15    Studies/Results: Ct Abdomen Pelvis W  Contrast  03/31/2014   CLINICAL DATA:  Left lower quadrant pain since 12 noon today. Rectal bleeding. History of diverticulitis. History of breast cancer. Previous hysterectomy, appendectomy, lumpectomy of the breast.  EXAM: CT ABDOMEN AND PELVIS WITH CONTRAST  TECHNIQUE: Multidetector CT imaging of the abdomen and pelvis was performed using the standard protocol following bolus administration of intravenous contrast.  CONTRAST:  25mL OMNIPAQUE IOHEXOL 300 MG/ML  SOLN  COMPARISON:  None.  FINDINGS: Atelectasis in the lung bases. Small pericardial effusion. Coronary artery calcifications.  Gallbladder is diffusely distended with mild wall thickening. No stones or infiltration identified. Mild intra and extrahepatic bile duct dilatation. No obstructing stone or lesion identified. There appears to be a lipoma in the head of the pancreas. Mild pancreatic ductal dilatation. Suggest follow-up with elective MRI/MRCP for further evaluation and to exclude occult obstructing lesion. Sub cm circumscribed low-attenuation lesion in the lateral segment left lobe  of the liver is too small to characterize but likely represents a cyst or hemangioma. The spleen, adrenal glands, inferior vena cava, and retroperitoneal lymph nodes are unremarkable. Diffuse calcification of the abdominal aorta and branch vessels. Infrarenal abdominal aorta and origin of the right iliac artery appear focally narrowed. Vessels are patent. Sub cm lesions in both kidneys likely representing small cysts. No hydronephrosis. Stomach and small bowel appear normal. Contrast material flows through the colon without evidence of bowel obstruction. Colon is incompletely distended but there is evidence of diffuse wall thickening involving the transverse and descending colon with extension into the sigmoid region. Mild pericolonic stranding. No evidence of pneumatosis. Inferior mesenteric artery appears patent.Changes are consistent with colitis, possibly infectious  or inflammatory. No free air or free fluid in the abdomen.  Pelvis: Diverticulosis of the sigmoid colon. No evidence of diverticulitis. Bladder is decompressed. Surgical absence of the uterus. No free air or free fluid in the abdomen. No pelvic mass or lymphadenopathy. Appendix is surgically absent. Degenerative changes in the spine. No destructive bone lesions.  IMPRESSION: Diffuse wall thickening of the transverse, sigmoid, and descending colon consistent with infectious or inflammatory colitis. Intra and extrahepatic bile duct dilatation, pancreatic ductal dilatation, and gallbladder distention without obvious cause. Consider follow-up elective MRI/MRCP to exclude underlying obstructing lesion. Mild gallbladder wall thickening. Small pericardial effusion.   Electronically Signed   By: Lucienne Capers M.D.   On: 03/31/2014 23:43   Dg Chest Portable 1 View  03/31/2014   CLINICAL DATA:  Abdomen pain and hematochezia beginning at noon today.  EXAM: PORTABLE CHEST - 1 VIEW  COMPARISON:  04/25/2013  FINDINGS: There is stable right hemidiaphragm elevation. There is unchanged mild cardiomegaly. No airspace consolidation is evident. No large effusions are evident.  There is no significant interval change.  IMPRESSION: Cardiomegaly. Unchanged right hemidiaphragm elevation. No acute findings are evident   Electronically Signed   By: Andreas Newport M.D.   On: 03/31/2014 20:45      Assessment:  transverse and descending colitis most suggestive of ischemia and less likely infectious, no stool studies positive as of yet Mild biliary and pancreatic ductal dilatation Plan: Continue Cipro and Flagyl, as well as supportive care, patient last had colonoscopy less than 2 years ago so hopefully will be able to avoid this Await MRCP.    Kelise Kuch C 04/02/2014, 9:54 AM

## 2014-04-02 NOTE — Progress Notes (Signed)
INITIAL NUTRITION ASSESSMENT  DOCUMENTATION CODES Per approved criteria  -Not Applicable   INTERVENTION: -Resource Breeze BID providing 250 kcals and 9 grams of protein. -Encourage PO intake  NUTRITION DIAGNOSIS: Inadequate oral intake related to poor appetite as evidenced by pt diet recall.   Goal: Pt to meet >/= 90% of needs through meals and supplements  Monitor:  Pt PO intake, supplement tolerance, wt trends, labs  Reason for Assessment: MST = 3  81 y.o. female  Admitting Dx: Colitis  ASSESSMENT: Pt presented to ED with abdominal pain and bright red blood per rectum.  PMH of HTN, GERD, diverticulosis, breast cancer and CHF.  Pt reported nausea and vomiting but this has stopped.    Pt reports a little weight loss and a significant loss of appetite in the past year since her left knee replacement.  Reports a rash to her mouth while hospitalized for the procedure and ever since then food doesn't taste right and she is just never hungry.  She usually drinks 2 protein shakes a day containing either 10 or 15 g protein, one at breakfast and one at dinner and some oatmeal raisin cookies with milk during the day.    Pt willing to try Resource Breeze for extra protein.  Dietetic Intern encouraged eating foods she likes and taking a few bits at time as well as ways to make food taste good so she does not loose weight.    Height: Ht Readings from Last 1 Encounters:  04/01/14 5' (1.524 m)    Weight: Wt Readings from Last 1 Encounters:  04/02/14 145 lb 8.1 oz (66 kg)    Ideal Body Weight: 100  % Ideal Body Weight: 145%  Wt Readings from Last 10 Encounters:  04/02/14 145 lb 8.1 oz (66 kg)  11/30/13 130 lb 12.8 oz (59.33 kg)  05/01/13 144 lb (65.318 kg)  04/25/13 144 lb (65.318 kg)  12/27/12 148 lb 1.6 oz (67.178 kg)    Usual Body Weight: 130  % Usual Body Weight: 111%  BMI:  Body mass index is 28.42 kg/(m^2).  Estimated Nutritional Needs: Kcal: 1400-1600  kcals Protein: 80-95 g protein Fluid: 1.2L/day  Skin: WDL  Diet Order: Diet clear liquid  EDUCATION NEEDS: -No education needs identified at this time   Intake/Output Summary (Last 24 hours) at 04/02/14 0938 Last data filed at 04/02/14 0917  Gross per 24 hour  Intake    805 ml  Output    650 ml  Net    155 ml    Last BM: PTA   Labs:   Recent Labs Lab 03/31/14 1937 04/01/14 0616 04/02/14 0659  NA 139 138 136  K 3.7 3.6 3.8  CL 101 104 107  CO2 27 29 24   BUN 20 14 9   CREATININE 1.19* 0.98 0.99  CALCIUM 9.9 8.5 8.6  GLUCOSE 137* 129* 115*    CBG (last 3)  No results for input(s): GLUCAP in the last 72 hours.  Scheduled Meds: . ciprofloxacin  400 mg Intravenous Q12H  . metronidazole  500 mg Intravenous Q8H  . pantoprazole (PROTONIX) IV  40 mg Intravenous Q12H  . simvastatin  20 mg Oral QHS  . sodium chloride  3 mL Intravenous Q12H  . traZODone  100 mg Oral QHS    Continuous Infusions: . sodium chloride 1,000 mL (04/01/14 2035)    Past Medical History  Diagnosis Date  . History of nuclear stress test 11/2009    dipyridamole; no evidence of ischemia, normal, low  risk   . Dyslipidemia   . Breast cancer 1997    right - tx with lumpectomty & radiation  . Hearing impairment     BILATERAL HEARING AIDS  . History of shingles 1998    NO RESIDUAL PROBLEMS  . Sinus problem   . Shortness of breath     WHEN CLIMBING STEPS AT MY HOME--OTHERWISE OK  . GERD (gastroesophageal reflux disease)     RARE - NO MEDICATIONS  . Lymphedema of arm     RIGHT   . OA (osteoarthritis)     OA AND PAIN LEFT KNEE  . Diverticulosis   . Sleep difficulties     ALWAYS HAD SLEEPING PROBLEMS - MEDICATION HELPS PT SLEEP ABOUT 3 HOURS  . Hypertension     SEES CARDIOLOGIST DR. DAVID HARDING FOR HYPERTENSION     Past Surgical History  Procedure Laterality Date  . Replacement total knee  2002  . Rotator cuff repair Right 12/2009    Dr. Lenna Sciara. [place]   . Appendectomy  1951  .  Abdominal hysterectomy  1973  . Transthoracic echocardiogram  05/1994    trace mitral/tricuspic/aortic inusff; hyperkinetic LV  . Eye surgery      BILATERAL CATARACT EXTRACTIONS AND LENS IMPLANTS  . Breast lumpectomy Right 1997  . Breast adenoma  1950    EXCISED  . Total knee arthroplasty Left 05/01/2013    Procedure: LEFT TOTAL KNEE ARTHROPLASTY;  Surgeon: Gearlean Alf, MD;  Location: [facility];  Service: Orthopedics;  Laterality: Left;    Elmer Picker MS Dietetic Intern Pager Number [phone]

## 2014-04-02 NOTE — Progress Notes (Signed)
Pt had small amount of bloody stool this afternoon.

## 2014-04-02 NOTE — Evaluation (Addendum)
Occupational Therapy Evaluation Patient Details Name: [name] Morton MRN: [id number] DOB: Nov 22, 1926 Today's Date: 04/02/2014    History of Present Illness Pt admitted 03-31-14 with colitis.   Clinical Impression    Pt admitted with above. Pt independent with ADLs, PTA. Feel pt will benefit from acute OT to increase strength prior to d/c.     Follow Up Recommendations  No OT follow up;Supervision/Assistance - 24 hour    Equipment Recommendations  None recommended by OT    Recommendations for Other Services       Precautions / Restrictions Precautions Precautions: Fall Restrictions Weight Bearing Restrictions: No      Mobility Bed Mobility Overal bed mobility: Modified Independent                Transfers Overall transfer level: Needs assistance Equipment used: None Transfers: Sit to/from Stand Sit to Stand: Supervision              Balance  No LOB in session-pt states it feels off compared to normal.                                          ADL Overall ADL's : Needs assistance/impaired                 Upper Body Dressing : Set up;Sitting   Lower Body Dressing: Set up;Supervision/safety;Sit to/from stand   Toilet Transfer: Min guard;Ambulation (bed)           Functional mobility during ADLs: Min guard General ADL Comments: Educated on safety such as safe shoewear, rugs/items on floor, sitting for most of LB ADLs. Educated on LB dressing technique. Recommended someone be with her for shower transfer. Suggested using 3 in 1 as shower chair at brother's house. Educated on what pt could use for toilet aide for hygiene and discussed using long sponge to wash bottom-pt states that sometimes she has trouble reaching to wipe and fixing hair with Rt UE.      Vision  Pt wears glasses.                   Perception     Praxis      Pertinent Vitals/Pain Pain Assessment: No/denies pain     Hand Dominance Right    Extremity/Trunk Assessment Upper Extremity Assessment Upper Extremity Assessment: RUE deficits/detail;Generalized weakness RUE Deficits / Details: Rt UE weaker than Lt UE; lymphedema in Rt UE   Lower Extremity Assessment Lower Extremity Assessment: Defer to PT evaluation       Communication Communication Communication: No difficulties   Cognition Arousal/Alertness: Awake/alert Behavior During Therapy: WFL for tasks assessed/performed Overall Cognitive Status: Within Functional Limits for tasks assessed                     General Comments       Exercises  Encouraged pt to be moving UE's.     Shoulder Instructions      Home Living Family/patient expects to be discharged to:: Private residence Living Arrangements: Alone Available Help at Discharge: Family;Available 24 hours/day Type of Home: House Home Access: Stairs to enter [employer] of Steps: 2   Home Layout: One level     Bathroom Shower/Tub: Occupational psychologist: Standard     Home Equipment: Bedside commode;Morton - 2 wheels; long sponge   Additional Comments: plans to stay with  brother       Prior Functioning/Environment Level of Independence: Independent             OT Diagnosis: Generalized weakness   OT Problem List: Decreased knowledge of use of DME or AE;Decreased knowledge of precautions;Impaired balance (sitting and/or standing);Decreased strength;Impaired UE functional use   OT Treatment/Interventions: Self-care/ADL training;Therapeutic exercise;DME and/or AE instruction;Therapeutic activities;Patient/family education;Balance training    OT Goals(Current goals can be found in the care plan section) Acute Rehab OT Goals Patient Stated Goal: not stated OT Goal Formulation: With patient Time For Goal Achievement: 04/09/14 Potential to Achieve Goals: Good ADL Goals Pt Will Transfer to Toilet: with modified independence;ambulating Additional ADL Goal #1: Pt  will independently perform HEP  to increase strength in bilateral UE's.   OT Frequency: Min 2X/week   Barriers to D/C:            Co-evaluation              End of Session Equipment Utilized During Treatment: Gait belt  Activity Tolerance: Patient tolerated treatment well Patient left: in bed;with call bell/phone within reach;with bed alarm set   Time: 6122-4497 OT Time Calculation (min): 18 min Charges:  OT General Charges $OT Visit: 1 Procedure OT Evaluation $Initial OT Evaluation Tier I: 1 Procedure G-CodesBenito Mccreedy OTR/L [id number] 04/02/2014, 11:50 AM

## 2014-04-03 DIAGNOSIS — K839 Disease of biliary tract, unspecified: Secondary | ICD-10-CM

## 2014-04-03 DIAGNOSIS — D62 Acute posthemorrhagic anemia: Secondary | ICD-10-CM

## 2014-04-03 LAB — BASIC METABOLIC PANEL
Anion gap: 8 (ref 5–15)
BUN: 7 mg/dL (ref 6–23)
CO2: 22 mmol/L (ref 19–32)
Calcium: 8.4 mg/dL (ref 8.4–10.5)
Chloride: 109 mmol/L (ref 96–112)
Creatinine, Ser: 0.95 mg/dL (ref 0.50–1.10)
GFR calc non Af Amer: 53 mL/min — ABNORMAL LOW (ref >90)
GFR, EST AFRICAN AMERICAN: 62 mL/min — AB (ref >90)
Glucose, Bld: 89 mg/dL (ref 70–99)
Potassium: 3.6 mmol/L (ref 3.5–5.1)
SODIUM: 139 mmol/L (ref 135–145)

## 2014-04-03 LAB — CBC
HEMATOCRIT: 30 % — AB (ref 36.0–46.0)
HEMOGLOBIN: 10.2 g/dL — AB (ref 12.0–15.0)
MCH: 30.2 pg (ref 26.0–34.0)
MCHC: 34 g/dL (ref 30.0–36.0)
MCV: 88.8 fL (ref 78.0–100.0)
Platelets: 155 10*3/uL (ref 150–400)
RBC: 3.38 MIL/uL — ABNORMAL LOW (ref 3.87–5.11)
RDW: 13.6 % (ref 11.5–15.5)
WBC: 8.5 10*3/uL (ref 4.0–10.5)

## 2014-04-03 LAB — CLOSTRIDIUM DIFFICILE BY PCR: Toxigenic C. Difficile by PCR: NEGATIVE

## 2014-04-03 NOTE — Progress Notes (Signed)
GI addendum: MRCP shows slightly dilated bile duct but no stone or stricture or mass. Given normal LFTs I think this is probably an incidental finding and would not work up further.

## 2014-04-03 NOTE — Progress Notes (Signed)
[facility] PROGRESS NOTE  [name] Morton FFM:[id number] DOB: April 23, 1926 DOA: 03/31/2014  PCP: Marjorie Smolder, MD  Brief HPI: 81 year old Caucasian female presented with abdominal pain and rectal bleeding. She was found to have colitis on CT scan. She was admitted to the hospital and started on IV antibiotics.  Past medical history:  Past Medical History  Diagnosis Date  . History of nuclear stress test 11/2009    dipyridamole; no evidence of ischemia, normal, low risk   . Dyslipidemia   . Breast cancer 1997    right - tx with lumpectomty & radiation  . Hearing impairment     BILATERAL HEARING AIDS  . History of shingles 1998    NO RESIDUAL PROBLEMS  . Sinus problem   . Shortness of breath     WHEN CLIMBING STEPS AT MY HOME--OTHERWISE OK  . GERD (gastroesophageal reflux disease)     RARE - NO MEDICATIONS  . Lymphedema of arm     RIGHT   . OA (osteoarthritis)     OA AND PAIN LEFT KNEE  . Diverticulosis   . Sleep difficulties     ALWAYS HAD SLEEPING PROBLEMS - MEDICATION HELPS PT SLEEP ABOUT 3 HOURS  . Hypertension     SEES CARDIOLOGIST DR. DAVID HARDING FOR HYPERTENSION     Consultants: [facility]  Procedures: None  Antibiotics: Cipro and Flagyl 1/31  Subjective: Patient reports bloody bowel movement yesterday afternoon. None since then. She had another bowel movement this morning which was nonbloody. Denies any abdominal pain. No nausea, vomiting.   Objective: Vital Signs  Filed Vitals:   04/02/14 2241 04/03/14 0240 04/03/14 0449 04/03/14 0454  BP: 138/44 162/82  136/45  Pulse: 75   73  Temp: 99.1 F (37.3 C) 97.8 F (36.6 C)  98.4 F (36.9 C)  TempSrc: Oral Oral  Oral  Resp: 20 20  18   Height:      Weight:   64.6 kg (142 lb 6.7 oz)   SpO2: 90% 98%  95%    Intake/Output Summary (Last 24 hours) at 04/03/14 1016 Last data filed at 04/03/14 1000  Gross per 24 hour  Intake   2138 ml  Output   1875 ml  Net    263 ml   Filed Weights   04/01/14 0145 04/02/14 0552 04/03/14 0449  Weight: 62.959 kg (138 lb 12.8 oz) 66 kg (145 lb 8.1 oz) 64.6 kg (142 lb 6.7 oz)     General appearance: alert, cooperative, appears stated age and no distress Resp: clear to auscultation bilaterally Cardio: regular rate and rhythm, S1, S2 normal, no murmur, click, rub or gallop GI: soft, non-tender; bowel sounds normal; no masses,  no organomegaly Extremities: extremities normal, atraumatic, no cyanosis or edema Neurologic: alert and oriented x 3. No focal deficits.  Lab Results:  Basic Metabolic Panel:  Recent Labs Lab 03/31/14 1937 04/01/14 0616 04/02/14 0659 04/03/14 0548  NA 139 138 136 139  K 3.7 3.6 3.8 3.6  CL 101 104 107 109  CO2 27 29 24 22   GLUCOSE 137* 129* 115* 89  BUN 20 14 9 7   CREATININE 1.19* 0.98 0.99 0.95  CALCIUM 9.9 8.5 8.6 8.4   Liver Function Tests:  Recent Labs Lab 03/31/14 1937 04/01/14 0616  AST 24 16  ALT 20 16  ALKPHOS 87 61  BILITOT 0.6 0.8  PROT 7.7 5.9*  ALBUMIN 4.4 3.2*    Recent Labs Lab 03/31/14 1937  LIPASE 32   CBC:  Recent Labs Lab 03/31/14 1937 04/01/14 0052 04/01/14 0616 04/01/14 1629 04/02/14 0659 04/03/14 0548  WBC 11.9* 9.3 9.3 12.3* 10.0 8.5  NEUTROABS 9.1* 6.1 6.4  --   --   --   HGB 14.0 10.3* 11.7* 11.0* 10.2* 10.2*  HCT 40.9 31.6* 34.8* 33.1* 30.3* 30.0*  MCV 86.8 90.8 88.1 90.9 88.9 88.8  PLT 230 159 187 165 153 155     Studies/Results: Mr 3d Recon At Scanner  04/03/2014   CLINICAL DATA:  Subsequent encounter for colitis. Biliary ductal dilatation on abdominal CT.  EXAM: MRI ABDOMEN WITHOUT AND WITH CONTRAST (INCLUDING MRCP)  TECHNIQUE: Multiplanar multisequence MR imaging of the abdomen was performed both before and after the administration of intravenous contrast. Heavily T2-weighted images of the biliary and pancreatic ducts were obtained, and three-dimensional MRCP images were rendered by post processing.  CONTRAST:  41mL MULTIHANCE GADOBENATE  DIMEGLUMINE 529 MG/ML IV SOLN  COMPARISON:  CT 03/31/2014.  FINDINGS: Mild-to-moderate motion degradation throughout.  Lower chest: Mild cardiomegaly. Trace bilateral pleural fluid is likely physiologic. Right hemidiaphragm elevation.  Hepatobiliary: Lateral segment left liver lobe lesion is likely a cyst. Intrahepatic ducts are upper normal for patient age. No gallstones or gallbladder wall thickening. No pericholecystic edema.  The common duct is mildly dilated for patient age. 11-12 mm in the porta hepatis. Upper normal 9-10 mm in this age group. Tapers minimally to 11 mm just above the pancreatic head. Example image 33 of series 7. No evidence of obstructive stone or mass.  Pancreas: Pancreatic duct is upper normal for patient age, 4 mm on image 27 of series 7. There is no evidence of acute pancreatitis. Positioned within the pancreatic head/ uncinate process is at 9 mm fat signal lesion, including image 38 of series 4. See also image 38 of the 03/31/2014 CT. This is positioned inferior to the common duct insertion into the ampulla.  Spleen: Normal  Adrenals/Urinary Tract: Normal adrenal glands. Bilateral small renal lesions which are likely cysts. No hydronephrosis.  Stomach/Bowel: Normal stomach and small bowel loops. Persistent edema surrounding the splenic flexure colon. Example image 42 series 4.  Vascular/Lymphatic: Extensive atherosclerosis within the aorta and branch vessels. No retroperitoneal or retrocrural adenopathy.  Other: Trace ascites within the left pericolic gutter.  Musculoskeletal: No acute osseous abnormality.  IMPRESSION: 1. Motion degradation. 2. Mild common duct dilatation for age. No obstructive stone or mass identified. The intrahepatic ducts and pancreatic duct are felt to be within normal limits for patient age. If the bilirubin is elevated, or there is a strong clinical concern of ampullary stenosis/ occult ampullary lesion, ERCP could be performed. 3. 9 mm pancreatic head/uncinate  process lipoma, positioned inferior to the insertion of the common duct into the ampulla. 4. Persistent splenic flexure colitis, in a distribution suspicious for a ischemia.   Electronically Signed   By: Abigail Miyamoto M.D.   On: 04/03/2014 08:28   [name] Rama W/wo Cm/mrcp  04/03/2014   CLINICAL DATA:  Subsequent encounter for colitis. Biliary ductal dilatation on abdominal CT.  EXAM: MRI ABDOMEN WITHOUT AND WITH CONTRAST (INCLUDING MRCP)  TECHNIQUE: Multiplanar multisequence MR imaging of the abdomen was performed both before and after the administration of intravenous contrast. Heavily T2-weighted images of the biliary and pancreatic ducts were obtained, and three-dimensional MRCP images were rendered by post processing.  CONTRAST:  62mL MULTIHANCE GADOBENATE DIMEGLUMINE 529 MG/ML IV SOLN  COMPARISON:  CT 03/31/2014.  FINDINGS: Mild-to-moderate motion degradation throughout.  Lower chest: Mild cardiomegaly. Trace bilateral  pleural fluid is likely physiologic. Right hemidiaphragm elevation.  Hepatobiliary: Lateral segment left liver lobe lesion is likely a cyst. Intrahepatic ducts are upper normal for patient age. No gallstones or gallbladder wall thickening. No pericholecystic edema.  The common duct is mildly dilated for patient age. 11-12 mm in the porta hepatis. Upper normal 9-10 mm in this age group. Tapers minimally to 11 mm just above the pancreatic head. Example image 33 of series 7. No evidence of obstructive stone or mass.  Pancreas: Pancreatic duct is upper normal for patient age, 4 mm on image 27 of series 7. There is no evidence of acute pancreatitis. Positioned within the pancreatic head/ uncinate process is at 9 mm fat signal lesion, including image 38 of series 4. See also image 38 of the 03/31/2014 CT. This is positioned inferior to the common duct insertion into the ampulla.  Spleen: Normal  Adrenals/Urinary Tract: Normal adrenal glands. Bilateral small renal lesions which are likely cysts. No  hydronephrosis.  Stomach/Bowel: Normal stomach and small bowel loops. Persistent edema surrounding the splenic flexure colon. Example image 42 series 4.  Vascular/Lymphatic: Extensive atherosclerosis within the aorta and branch vessels. No retroperitoneal or retrocrural adenopathy.  Other: Trace ascites within the left pericolic gutter.  Musculoskeletal: No acute osseous abnormality.  IMPRESSION: 1. Motion degradation. 2. Mild common duct dilatation for age. No obstructive stone or mass identified. The intrahepatic ducts and pancreatic duct are felt to be within normal limits for patient age. If the bilirubin is elevated, or there is a strong clinical concern of ampullary stenosis/ occult ampullary lesion, ERCP could be performed. 3. 9 mm pancreatic head/uncinate process lipoma, positioned inferior to the insertion of the common duct into the ampulla. 4. Persistent splenic flexure colitis, in a distribution suspicious for a ischemia.   Electronically Signed   By: Abigail Miyamoto M.D.   On: 04/03/2014 08:28    Medications:  Scheduled: . ciprofloxacin  400 mg Intravenous Q12H  . feeding supplement (RESOURCE BREEZE)  1 Container Oral BID BM  . metronidazole  500 mg Intravenous Q8H  . pantoprazole (PROTONIX) IV  40 mg Intravenous Q12H  . simvastatin  20 mg Oral QHS  . sodium chloride  3 mL Intravenous Q12H  . traZODone  100 mg Oral QHS   Continuous: . sodium chloride 1,000 mL (04/02/14 1154)   WIO:[id number] **OR** acetaminophen, acetaminophen, morphine injection, ondansetron **OR** ondansetron (ZOFRAN) IV  Assessment/Plan:  Principal Problem:   Colitis Active Problems:   Hypertension, essential, benign   OA (osteoarthritis) of knee   GERD (gastroesophageal reflux disease)   Diverticulosis of large intestine   Diastolic dysfunction with chronic heart failure   Acute blood loss anemia    Colitis with hematochezia CT scans shows pancolitis, suggesting infectious or inflammatory etiology.  Could also be ischemic. Did bleed again yesterday but frequency appears to be decreasing. Hemoglobin remains stable. Continue IV antibiotics for now. She reports a colonoscopy within the last 3 years, which revealed diverticulosis per patient. No reports found in epic. There is a pathology report of adenomatous polyps from 2014. GI is following. Don't anticipate any procedures unless she has continued bleeding. She denies any recent travel. Denies eating out recently. Denies eating any unusual food recently. Can advance to full liquids.   Acute blood loss anemia Hemoglobin is lower but still stable. No need for transfusion.  Essential Hypertension Blood pressure stable. Continue to hold her oral agents for now.  Chronic diastolic heart failure. Well compensated. Continue to monitor.  History of GERD. Continue Protonix.  Biliary ductal dilatation This was noted incidentally on her CT scan. LFTs are normal. Alkaline phosphatase is normal. GI recommended MRCP, which has been done. Doesn't show any concerning findings. Don't anticipate further workup.  DVT Prophylaxis: SCDs    Code Status: Full code  Family Communication: Discussed with the patient Disposition Plan: Mobilize. Not ready for discharge.     LOS: 3 days   [facility] Pager [phone] 04/03/2014, 10:16 AM  If 7PM-7AM, please contact night-coverage at [email or web address], password [facility]

## 2014-04-03 NOTE — Progress Notes (Signed)
Occupational Therapy Treatment Patient Details Name: [name] Morton MRN: [id number] DOB: 26-Jun-1926 Today's Date: 04/03/2014    History of present illness Pt admitted 03-31-14 with colitis.   OT comments  Pt making excellent progress. Appropriate for D/C home with 24/7 when medically stable. OT goals met. OT signing off.   Follow Up Recommendations  No OT follow up;Supervision/Assistance - 24 hour    Equipment Recommendations  None recommended by OT    Recommendations for Other Services      Precautions / Restrictions Precautions Precautions: none       Mobility Bed Mobility  mod I                Transfers Overall transfer level: Modified independent   Transfers: Sit to/from Stand;Stand Pivot Transfers Sit to Stand: Modified independent (Device/Increase time) Stand pivot transfers: Modified independent (Device/Increase time)            Balance Overall balance assessment: No apparent balance deficits (not formally assessed)                                 ADL Overall ADL's : Needs assistance/impaired                                     Functional mobility during ADLs: Modified independent General ADL Comments: Pt overall S with ADL. Brother sill be able to provide this level of A. Discussed compensatory techniques for hygiene after toileting. Educated on home safety.      Vision                     Perception     Praxis      Cognition   Behavior During Therapy: [facility] for tasks assessed/performed Overall Cognitive Status: Within Functional Limits for tasks assessed                       Extremity/Trunk Assessment   R shoulder limitation from prior injury            Exercises Other Exercises Other Exercises: Taught pt theraband strengthening ex for BUE to be completted within pain tolerance of L shoulder. PT able to return demonstrate   Shoulder Instructions       General Comments       Pertinent Vitals/ Pain       Pain Assessment: No/denies pain  Home Living    Plans to D/C home with brother initially. He can provide 24/7 assistance when needed.                                       Prior Functioning/Environment              Frequency       Progress Toward Goals  OT Goals(current goals can now be found in the care plan section)  Progress towards OT goals: Goals met/education completed, patient discharged from OT  Acute Rehab OT Goals Patient Stated Goal: to go home with brother OT Goal Formulation: With patient Time For Goal Achievement: 04/09/14 Potential to Achieve Goals: Good ADL Goals Pt Will Transfer to Toilet: with modified independence;ambulating Additional ADL Goal #1: Pt will independently perform HEP  to increase strength in bilateral UE's.   Plan  Discharge plan remains appropriate;All goals met and education completed, patient discharged from OT services    Co-evaluation                 End of Session     Activity Tolerance Patient tolerated treatment well   Patient Left in chair;with call bell/phone within reach;with chair alarm set   Nurse Communication          Time: 6979-4801 OT Time Calculation (min): 22 min  Charges: OT General Charges $OT Visit: 1 Procedure OT Treatments $Therapeutic Activity: 8-22 mins  Issa Kosmicki,HILLARY 04/03/2014, 12:03 PM   [facility], OTR/L  [phone] 04/03/2014

## 2014-04-03 NOTE — Progress Notes (Signed)
[facility] Progress Note  Subjective: Patient had one small amount of bloody stool yesterday afternoon, none since  Objective: Vital signs in last 24 hours: Temp:  [97.8 F (36.6 C)-99.7 F (37.6 C)] 98.4 F (36.9 C) (02/02 0454) Pulse Rate:  [73-75] 73 (02/02 0454) Resp:  [17-20] 18 (02/02 0454) BP: (130-162)/(40-82) 136/45 mmHg (02/02 0454) SpO2:  [90 %-98 %] 95 % (02/02 0454) Weight:  [64.6 kg (142 lb 6.7 oz)] 64.6 kg (142 lb 6.7 oz) (02/02 0449) Weight change: -1.4 kg (-3 lb 1.4 oz)   PE: Unchanged  Lab Results: Results for orders placed or performed during the hospital encounter of 03/31/14 (from the past 24 hour(s))  CBC     Status: Abnormal   Collection Time: 04/03/14  5:48 AM  Result Value Ref Range   WBC 8.5 4.0 - 10.5 K/uL   RBC 3.38 (L) 3.87 - 5.11 MIL/uL   Hemoglobin 10.2 (L) 12.0 - 15.0 g/dL   HCT 30.0 (L) 36.0 - 46.0 %   MCV 88.8 78.0 - 100.0 fL   MCH 30.2 26.0 - 34.0 pg   MCHC 34.0 30.0 - 36.0 g/dL   RDW 13.6 11.5 - 15.5 %   Platelets 155 150 - 400 K/uL  Basic metabolic panel     Status: Abnormal   Collection Time: 04/03/14  5:48 AM  Result Value Ref Range   Sodium 139 135 - 145 mmol/L   Potassium 3.6 3.5 - 5.1 mmol/L   Chloride 109 96 - 112 mmol/L   CO2 22 19 - 32 mmol/L   Glucose, Bld 89 70 - 99 mg/dL   BUN 7 6 - 23 mg/dL   Creatinine, Ser 0.95 0.50 - 1.10 mg/dL   Calcium 8.4 8.4 - 10.5 mg/dL   GFR calc non Af Amer 53 (L) >90 mL/min   GFR calc Af Amer 62 (L) >90 mL/min   Anion gap 8 5 - 15    Studies/Results: No results found.    Assessment: 1. Likely ischemic colitis 2. Mild biliary ductal and pancreatic duct dilatation, significance unclear  Plan: 1. Await MRCP results 2. Continue Cipro and Flagyl supportive care, gradual advancement of diet as tolerated    Tenae Graziosi C 04/03/2014, 7:58 AM

## 2014-04-04 LAB — CBC
HCT: 30.2 % — ABNORMAL LOW (ref 36.0–46.0)
HEMOGLOBIN: 10.3 g/dL — AB (ref 12.0–15.0)
MCH: 30.2 pg (ref 26.0–34.0)
MCHC: 34.1 g/dL (ref 30.0–36.0)
MCV: 88.6 fL (ref 78.0–100.0)
PLATELETS: 152 10*3/uL (ref 150–400)
RBC: 3.41 MIL/uL — ABNORMAL LOW (ref 3.87–5.11)
RDW: 13.4 % (ref 11.5–15.5)
WBC: 6.6 10*3/uL (ref 4.0–10.5)

## 2014-04-04 LAB — BASIC METABOLIC PANEL
Anion gap: 5 (ref 5–15)
BUN: <5 mg/dL — ABNORMAL LOW (ref 6–23)
CO2: 25 mmol/L (ref 19–32)
Calcium: 8.6 mg/dL (ref 8.4–10.5)
Chloride: 109 mmol/L (ref 96–112)
Creatinine, Ser: 0.91 mg/dL (ref 0.50–1.10)
GFR, EST AFRICAN AMERICAN: 65 mL/min — AB (ref >90)
GFR, EST NON AFRICAN AMERICAN: 56 mL/min — AB (ref >90)
Glucose, Bld: 102 mg/dL — ABNORMAL HIGH (ref 70–99)
POTASSIUM: 3.7 mmol/L (ref 3.5–5.1)
SODIUM: 139 mmol/L (ref 135–145)

## 2014-04-04 MED ORDER — ZOLPIDEM TARTRATE 5 MG PO TABS
5.0000 mg | ORAL_TABLET | Freq: Once | ORAL | Status: AC
  Administered 2014-04-04: 5 mg via ORAL
  Filled 2014-04-04: qty 1

## 2014-04-04 NOTE — Progress Notes (Signed)
[facility] PROGRESS NOTE  [name] Morton KGY:[id number] DOB: 07/16/1926 DOA: 03/31/2014  PCP: Marjorie Smolder, MD  Brief HPI: 81 year old Caucasian female presented with abdominal pain and rectal bleeding. She was found to have colitis on CT scan. She was admitted to the hospital and started on IV antibiotics.  Colitis with hematochezia CT scans shows pancolitis, suggesting infectious or inflammatory etiology. Could also be ischemic.  Hemoglobin remains stable.  Continue IV antibiotics, ciprofloxacin and flagyl day 4. GI following.  Continue with full liquid diet.  C diff negative. GI pathogen pending.   Acute blood loss anemia Hemoglobin is lower but still stable. No need for transfusion.  Essential Hypertension Blood pressure stable. Continue to hold her oral agents for now.  Chronic diastolic heart failure. Well compensated. Continue to monitor.   History of GERD. Continue Protonix.  Biliary ductal dilatation This was noted incidentally on her CT scan. LFTs are normal. Alkaline phosphatase is normal.  GI recommended MRCP which show mild duct dilation.  No further work up per GI.    Consultants: [facility]  Procedures: None  Antibiotics: Cipro and Flagyl 1/31  Subjective: Still with 6 to 7 BM per days. Pain better. No nausea or vomiting.   Objective: Vital Signs  Filed Vitals:   04/03/14 1402 04/03/14 2110 04/04/14 0630 04/04/14 1412  BP: 138/46 157/54 141/57 144/57  Pulse: 78 72 81 67  Temp: 98.4 F (36.9 C) 98.8 F (37.1 C) 98.7 F (37.1 C) 98.6 F (37 C)  TempSrc: Oral Oral Oral Oral  Resp: 20 20 20 18   Height:      Weight:   65.5 kg (144 lb 6.4 oz)   SpO2: 93% 95% 91% 96%    Intake/Output Summary (Last 24 hours) at 04/04/14 1719 Last data filed at 04/04/14 1414  Gross per 24 hour  Intake   1260 ml  Output    901 ml  Net    359 ml   Filed Weights   04/02/14 0552 04/03/14 0449 04/04/14 0630  Weight: 66 kg (145 lb 8.1 oz) 64.6 kg  (142 lb 6.7 oz) 65.5 kg (144 lb 6.4 oz)     General appearance: alert, cooperative, appears stated age and no distress Resp: clear to auscultation bilaterally Cardio: regular rate and rhythm, S1, S2 normal, no murmur, click, rub or gallop GI: soft, non-tender; bowel sounds normal; no masses,  no organomegaly Extremities: extremities normal, atraumatic, no cyanosis or edema Neurologic: alert and oriented x 3. No focal deficits.  Lab Results:  Basic Metabolic Panel:  Recent Labs Lab 03/31/14 1937 04/01/14 0616 04/02/14 0659 04/03/14 0548 04/04/14 0745  NA 139 138 136 139 139  K 3.7 3.6 3.8 3.6 3.7  CL 101 104 107 109 109  CO2 27 29 24 22 25   GLUCOSE 137* 129* 115* 89 102*  BUN 20 14 9 7  <5*  CREATININE 1.19* 0.98 0.99 0.95 0.91  CALCIUM 9.9 8.5 8.6 8.4 8.6   Liver Function Tests:  Recent Labs Lab 03/31/14 1937 04/01/14 0616  AST 24 16  ALT 20 16  ALKPHOS 87 61  BILITOT 0.6 0.8  PROT 7.7 5.9*  ALBUMIN 4.4 3.2*    Recent Labs Lab 03/31/14 1937  LIPASE 32   CBC:  Recent Labs Lab 03/31/14 1937 04/01/14 0052 04/01/14 0616 04/01/14 1629 04/02/14 0659 04/03/14 0548 04/04/14 0745  WBC 11.9* 9.3 9.3 12.3* 10.0 8.5 6.6  NEUTROABS 9.1* 6.1 6.4  --   --   --   --  HGB 14.0 10.3* 11.7* 11.0* 10.2* 10.2* 10.3*  HCT 40.9 31.6* 34.8* 33.1* 30.3* 30.0* 30.2*  MCV 86.8 90.8 88.1 90.9 88.9 88.8 88.6  PLT 230 159 187 165 153 155 152     Studies/Results: Mr 3d Recon At Scanner  04/03/2014   CLINICAL DATA:  Subsequent encounter for colitis. Biliary ductal dilatation on abdominal CT.  EXAM: MRI ABDOMEN WITHOUT AND WITH CONTRAST (INCLUDING MRCP)  TECHNIQUE: Multiplanar multisequence MR imaging of the abdomen was performed both before and after the administration of intravenous contrast. Heavily T2-weighted images of the biliary and pancreatic ducts were obtained, and three-dimensional MRCP images were rendered by post processing.  CONTRAST:  38mL MULTIHANCE GADOBENATE  DIMEGLUMINE 529 MG/ML IV SOLN  COMPARISON:  CT 03/31/2014.  FINDINGS: Mild-to-moderate motion degradation throughout.  Lower chest: Mild cardiomegaly. Trace bilateral pleural fluid is likely physiologic. Right hemidiaphragm elevation.  Hepatobiliary: Lateral segment left liver lobe lesion is likely a cyst. Intrahepatic ducts are upper normal for patient age. No gallstones or gallbladder wall thickening. No pericholecystic edema.  The common duct is mildly dilated for patient age. 11-12 mm in the porta hepatis. Upper normal 9-10 mm in this age group. Tapers minimally to 11 mm just above the pancreatic head. Example image 33 of series 7. No evidence of obstructive stone or mass.  Pancreas: Pancreatic duct is upper normal for patient age, 4 mm on image 27 of series 7. There is no evidence of acute pancreatitis. Positioned within the pancreatic head/ uncinate process is at 9 mm fat signal lesion, including image 38 of series 4. See also image 38 of the 03/31/2014 CT. This is positioned inferior to the common duct insertion into the ampulla.  Spleen: Normal  Adrenals/Urinary Tract: Normal adrenal glands. Bilateral small renal lesions which are likely cysts. No hydronephrosis.  Stomach/Bowel: Normal stomach and small bowel loops. Persistent edema surrounding the splenic flexure colon. Example image 42 series 4.  Vascular/Lymphatic: Extensive atherosclerosis within the aorta and branch vessels. No retroperitoneal or retrocrural adenopathy.  Other: Trace ascites within the left pericolic gutter.  Musculoskeletal: No acute osseous abnormality.  IMPRESSION: 1. Motion degradation. 2. Mild common duct dilatation for age. No obstructive stone or mass identified. The intrahepatic ducts and pancreatic duct are felt to be within normal limits for patient age. If the bilirubin is elevated, or there is a strong clinical concern of ampullary stenosis/ occult ampullary lesion, ERCP could be performed. 3. 9 mm pancreatic head/uncinate  process lipoma, positioned inferior to the insertion of the common duct into the ampulla. 4. Persistent splenic flexure colitis, in a distribution suspicious for a ischemia.   Electronically Signed   By: Abigail Miyamoto M.D.   On: 04/03/2014 08:28   Mr [name] Morton Rama W/wo Cm/mrcp  04/03/2014   CLINICAL DATA:  Subsequent encounter for colitis. Biliary ductal dilatation on abdominal CT.  EXAM: MRI ABDOMEN WITHOUT AND WITH CONTRAST (INCLUDING MRCP)  TECHNIQUE: Multiplanar multisequence MR imaging of the abdomen was performed both before and after the administration of intravenous contrast. Heavily T2-weighted images of the biliary and pancreatic ducts were obtained, and three-dimensional MRCP images were rendered by post processing.  CONTRAST:  20mL MULTIHANCE GADOBENATE DIMEGLUMINE 529 MG/ML IV SOLN  COMPARISON:  CT 03/31/2014.  FINDINGS: Mild-to-moderate motion degradation throughout.  Lower chest: Mild cardiomegaly. Trace bilateral pleural fluid is likely physiologic. Right hemidiaphragm elevation.  Hepatobiliary: Lateral segment left liver lobe lesion is likely a cyst. Intrahepatic ducts are upper normal for patient age. No gallstones or gallbladder wall thickening.  No pericholecystic edema.  The common duct is mildly dilated for patient age. 11-12 mm in the porta hepatis. Upper normal 9-10 mm in this age group. Tapers minimally to 11 mm just above the pancreatic head. Example image 33 of series 7. No evidence of obstructive stone or mass.  Pancreas: Pancreatic duct is upper normal for patient age, 4 mm on image 27 of series 7. There is no evidence of acute pancreatitis. Positioned within the pancreatic head/ uncinate process is at 9 mm fat signal lesion, including image 38 of series 4. See also image 38 of the 03/31/2014 CT. This is positioned inferior to the common duct insertion into the ampulla.  Spleen: Normal  Adrenals/Urinary Tract: Normal adrenal glands. Bilateral small renal lesions which are likely cysts. No  hydronephrosis.  Stomach/Bowel: Normal stomach and small bowel loops. Persistent edema surrounding the splenic flexure colon. Example image 42 series 4.  Vascular/Lymphatic: Extensive atherosclerosis within the aorta and branch vessels. No retroperitoneal or retrocrural adenopathy.  Other: Trace ascites within the left pericolic gutter.  Musculoskeletal: No acute osseous abnormality.  IMPRESSION: 1. Motion degradation. 2. Mild common duct dilatation for age. No obstructive stone or mass identified. The intrahepatic ducts and pancreatic duct are felt to be within normal limits for patient age. If the bilirubin is elevated, or there is a strong clinical concern of ampullary stenosis/ occult ampullary lesion, ERCP could be performed. 3. 9 mm pancreatic head/uncinate process lipoma, positioned inferior to the insertion of the common duct into the ampulla. 4. Persistent splenic flexure colitis, in a distribution suspicious for a ischemia.   Electronically Signed   By: Abigail Miyamoto M.D.   On: 04/03/2014 08:28    Medications:  Scheduled: . ciprofloxacin  400 mg Intravenous Q12H  . feeding supplement (RESOURCE BREEZE)  1 Container Oral BID BM  . metronidazole  500 mg Intravenous Q8H  . pantoprazole (PROTONIX) IV  40 mg Intravenous Q12H  . simvastatin  20 mg Oral QHS  . sodium chloride  3 mL Intravenous Q12H  . traZODone  100 mg Oral QHS   Continuous: . sodium chloride 1,000 mL (04/03/14 2045)   MMH:[id number] **OR** acetaminophen, acetaminophen, morphine injection, ondansetron **OR** ondansetron (ZOFRAN) IV  Assessment/Plan:  Principal Problem:   Colitis Active Problems:   Hypertension, essential, benign   OA (osteoarthritis) of knee   GERD (gastroesophageal reflux disease)   Diverticulosis of large intestine   Diastolic dysfunction with chronic heart failure   Acute blood loss anemia     DVT Prophylaxis: SCDs    Code Status: Full code  Family Communication: Discussed with the  patient Disposition Plan: Mobilize. Not ready for discharge.     LOS: 4 days   Elmarie Shiley  [facility] Pager [phone] 04/04/2014, 5:19 PM  If 7PM-7AM, please contact night-coverage at [email or web address], password [facility]

## 2014-04-04 NOTE — Progress Notes (Signed)
Pt had an episode of SOB and nausea with vomiting. 37ml of yellow emesis as output. 2L therapeutic oxygen and 4mg  IV Zofran given with positive results.    Justine Null, RN

## 2014-04-04 NOTE — Progress Notes (Signed)
Late Entry Filing of note generate 2/2.    04/03/14 1457  PT Visit Information  Last PT Received On 04/03/14  Assistance Needed +1  History of Present Illness Pt admitted 03-31-14 with colitis.  PT Time Calculation  PT Start Time (ACUTE ONLY) 1438  PT Stop Time (ACUTE ONLY) 1455  PT Time Calculation (min) (ACUTE ONLY) 17 min  Subjective Data  Subjective I don't know what all the test fully say, but the doctor says I might be able to go home tommorrow.  Patient Stated Goal to go home with brother  Precautions  Precautions Fall  Pain Assessment  Pain Assessment No/denies pain  Cognition  Arousal/Alertness Awake/alert  Behavior During Therapy WFL for tasks assessed/performed  Overall Cognitive Status Within Functional Limits for tasks assessed  Transfers  Overall transfer level Modified independent  Equipment used None  Transfers Sit to/from [employer] Transfers  Sit to Stand Modified independent (Device/Increase time)  Stand pivot transfers Modified independent (Device/Increase time)  Ambulation/Gait  Ambulation/Gait assistance Supervision;Modified independent (Device/Increase time) (mod I in room; Supervision out in the halls)  Ambulation Distance (Feet) 300 Feet  Assistive device None  Gait Pattern/deviations Step-through pattern;Decreased stride length  Gait velocity decreased  General Gait Details generally steady; 1 self-recovered balance disturbance  Stairs Yes  Stairs assistance Min guard  Stair Management One rail Right;Step to pattern;Forwards  Number of Stairs 4  General stair comments safe with rail assist and guard.  Balance  Overall balance assessment Needs assistance;No apparent balance deficits (not formally assessed)  Sitting-balance support No upper extremity supported  Sitting balance-Leahy Scale Good  Standing balance support No upper extremity supported  Standing balance-Leahy Scale Fair  PT - End of Session  Activity Tolerance Patient  tolerated treatment well  Patient left in chair;with call bell/phone within reach;with family/visitor present  Nurse Communication Mobility status  PT - Assessment/Plan  PT Plan Current plan remains appropriate  PT Frequency (ACUTE ONLY) Min 3X/week  Follow Up Recommendations No PT follow up;Supervision/Assistance - 24 hour  PT equipment None recommended by PT  PT Goal Progression  Progress towards PT goals Progressing toward goals  Acute Rehab PT Goals  PT Goal Formulation With patient  Time For Goal Achievement 04/15/14  Potential to Achieve Goals Good  PT General Charges  $$ ACUTE PT VISIT 1 Procedure  PT Treatments  $Gait Training 8-22 mins    04/04/2014  Donnella Sham, PT [phone]  (pager)

## 2014-04-05 ENCOUNTER — Encounter (HOSPITAL_COMMUNITY): Payer: Self-pay | Admitting: *Deleted

## 2014-04-05 LAB — BASIC METABOLIC PANEL
Anion gap: 7 (ref 5–15)
CO2: 25 mmol/L (ref 19–32)
Calcium: 8.8 mg/dL (ref 8.4–10.5)
Chloride: 108 mmol/L (ref 96–112)
Creatinine, Ser: 0.96 mg/dL (ref 0.50–1.10)
GFR calc Af Amer: 61 mL/min — ABNORMAL LOW (ref >90)
GFR calc non Af Amer: 52 mL/min — ABNORMAL LOW (ref >90)
GLUCOSE: 149 mg/dL — AB (ref 70–99)
POTASSIUM: 3.6 mmol/L (ref 3.5–5.1)
SODIUM: 140 mmol/L (ref 135–145)

## 2014-04-05 MED ORDER — FUROSEMIDE 40 MG PO TABS
40.0000 mg | ORAL_TABLET | Freq: Every day | ORAL | Status: DC
  Administered 2014-04-05 – 2014-04-06 (×2): 40 mg via ORAL
  Filled 2014-04-05 (×2): qty 1

## 2014-04-05 MED ORDER — PANTOPRAZOLE SODIUM 40 MG PO TBEC
40.0000 mg | DELAYED_RELEASE_TABLET | Freq: Two times a day (BID) | ORAL | Status: DC
  Administered 2014-04-05 – 2014-04-06 (×2): 40 mg via ORAL
  Filled 2014-04-05 (×2): qty 1

## 2014-04-05 MED ORDER — METRONIDAZOLE 500 MG PO TABS
500.0000 mg | ORAL_TABLET | Freq: Three times a day (TID) | ORAL | Status: DC
  Administered 2014-04-05 – 2014-04-06 (×3): 500 mg via ORAL
  Filled 2014-04-05 (×6): qty 1

## 2014-04-05 MED ORDER — CIPROFLOXACIN HCL 500 MG PO TABS
500.0000 mg | ORAL_TABLET | Freq: Two times a day (BID) | ORAL | Status: DC
  Administered 2014-04-05 – 2014-04-06 (×2): 500 mg via ORAL
  Filled 2014-04-05 (×4): qty 1

## 2014-04-05 NOTE — Progress Notes (Signed)
[facility] Progress Note  Subjective: Patient denies any abdominal pain had nonbloody diarrhea the last 3 time she went bathroom passed only gas.  Objective: Vital signs in last 24 hours: Temp:  [97.8 F (36.6 C)-98.6 F (37 C)] 98.6 F (37 C) (02/04 0547) Pulse Rate:  [67-70] 70 (02/03 2141) Resp:  [18] 18 (02/04 0547) BP: (131-155)/(42-57) 131/42 mmHg (02/04 0547) SpO2:  [90 %-96 %] 90 % (02/04 0547) Weight:  [65.7 kg (144 lb 13.5 oz)] 65.7 kg (144 lb 13.5 oz) (02/04 0610) Weight change: 0.2 kg (7.1 oz)   PE: Abdomen soft nondistended with normoactive bowel sounds.  Lab Results: No results found for this or any previous visit (from the past 24 hour(s)).  Studies/Results: No results found.    Assessment: Probable ischemic colitis GI pathogens panel pending  Plan: Continue Cipro and Flagyl, can probably switch to oral form. Will advance diet    [name] Morton C 04/05/2014, 8:36 AM

## 2014-04-05 NOTE — Care Management Note (Unsigned)
    Page 1 of 1   04/05/2014     7:23:13 PM CARE MANAGEMENT NOTE 04/05/2014  Patient:  [name] Morton,[name] Morton   Account Number:  [id number]  Date Initiated:  04/03/2014  Documentation initiated by:  Tomi Bamberger  Subjective/Objective Assessment:   Dx:  GI Bleed  Lives at home alone     Action/Plan:   pt eval- no pt f/u  [facility] referral   Anticipated DC Date:  04/06/2014   Anticipated DC Plan:  [place]  CM consult      Choice offered to / List presented to:             Status of service:  In process, will continue to follow Medicare Important Message given?  YES (If response is "NO", the following Medicare IM given date fields will be blank) Date Medicare IM given:  04/02/2014 Medicare IM given by:  Tomi Bamberger Date Additional Medicare IM given:   Additional Medicare IM given by:    Discharge Disposition:    Per UR Regulation:  Reviewed for med. necessity/level of care/duration of stay  If discussed at [place] of Stay Meetings, dates discussed:    Comments:  04/05/14 Tomi Bamberger RN ,BSN [phone] [facility] referral, per physical therapy no pt f/u needed.  [facility]N will see patient on 2/5.

## 2014-04-05 NOTE — Progress Notes (Signed)
Physical Therapy Treatment Patient Details Name: [name] Morton MRN: [id number] DOB: 25-Dec-1926 Today's Date: 04/05/2014    History of Present Illness Pt admitted 03-31-14 with colitis.    PT Comments    Doing well,  Ready for d/c when cleared medically.   Follow Up Recommendations  No PT follow up;Supervision/Assistance - 24 hour     Equipment Recommendations  None recommended by PT    Recommendations for Other Services       Precautions / Restrictions Precautions Precautions: Fall Restrictions Weight Bearing Restrictions: No    Mobility  Bed Mobility                  Transfers Overall transfer level: Modified independent   Transfers: Sit to/from Stand;Stand Pivot Transfers Sit to Stand: Modified independent (Device/Increase time) Stand pivot transfers: Modified independent (Device/Increase time)          Ambulation/Gait Ambulation/Gait assistance: Supervision Ambulation Distance (Feet): 250 Feet Assistive device: None Gait Pattern/deviations: Step-through pattern Gait velocity: decreased   General Gait Details: steady, but slower   Stairs Stairs: Yes Stairs assistance: Supervision Stair Management: One rail Right;Alternating pattern;Forwards Number of Stairs: 4 General stair comments: safe with rail  Wheelchair Mobility    Modified Rankin (Stroke Patients Only)       Balance Overall balance assessment: Needs assistance Sitting-balance support: No upper extremity supported Sitting balance-Leahy Scale: Good       Standing balance-Leahy Scale: Good                      Cognition Arousal/Alertness: Awake/alert Behavior During Therapy: WFL for tasks assessed/performed Overall Cognitive Status: Within Functional Limits for tasks assessed                      Exercises General Exercises - Lower Extremity Quad Sets: AROM;Both;10 reps;Seated Heel Slides: AROM;Strengthening;Both;10 reps;Seated (resisted) Straight  Leg Raises: AROM;Both;10 reps;Seated    General Comments        Pertinent Vitals/Pain Pain Assessment: No/denies pain Pain Score: 0-No pain    Home Living                      Prior Function            PT Goals (current goals can now be found in the care plan section) Acute Rehab PT Goals PT Goal Formulation: With patient Time For Goal Achievement: 04/15/14 Potential to Achieve Goals: Good Progress towards PT goals: Progressing toward goals    Frequency  Min 3X/week    PT Plan Current plan remains appropriate    Co-evaluation             End of Session   Activity Tolerance: Patient tolerated treatment well Patient left: in chair;with call bell/phone within reach;with family/visitor present     Time: 9371-6967 PT Time Calculation (min) (ACUTE ONLY): 20 min  Charges:  $Gait Training: 8-22 mins                    G Codes:      Shyam Dawson, Tessie Fass 04/05/2014, 11:47 AM 04/05/2014  Donnella Sham, PT [phone]  (pager)

## 2014-04-05 NOTE — Progress Notes (Signed)
[facility] PROGRESS NOTE  [name] Morton BSW:[id number] DOB: 03/01/1927 DOA: 03/31/2014  PCP: Marjorie Smolder, MD  Brief HPI: 81 year old Caucasian female presented with abdominal pain and rectal bleeding. She was found to have colitis on CT scan. She was admitted to the hospital and started on IV antibiotics.  Colitis with hematochezia CT scans shows pancolitis, suggesting infectious or inflammatory etiology. Could also be ischemic.  Hemoglobin remains stable.   ciprofloxacin and flagyl day 4. Change to oral  GI following.  advance diet  C diff negative. GI pathogen pending.   Acute blood loss anemia Hemoglobin is lower but still stable. No need for transfusion.  Essential Hypertension Blood pressure stable. Continue to hold her oral agents for now.  Chronic diastolic heart failure. Well compensated. Continue to monitor.  NSL fluids,  Resume lasix.   History of GERD. Continue Protonix.  Biliary ductal dilatation This was noted incidentally on her CT scan. LFTs are normal. Alkaline phosphatase is normal.  GI recommended MRCP which show mild duct dilation.  No further work up per GI.    Consultants: [facility]  Procedures: None  Antibiotics: Cipro and Flagyl 1/31  Subjective: [place] BM. Feeling better, breathing well.    Objective: Vital Signs  Filed Vitals:   04/04/14 2141 04/05/14 0547 04/05/14 0610 04/05/14 1328  BP: 155/51 131/42  134/46  Pulse: 70   71  Temp: 97.8 F (36.6 C) 98.6 F (37 C)  98.5 F (36.9 C)  TempSrc: Oral Oral  Oral  Resp: 18 18  16   Height:      Weight:   65.7 kg (144 lb 13.5 oz)   SpO2: 94% 90%  91%    Intake/Output Summary (Last 24 hours) at 04/05/14 1558 Last data filed at 04/05/14 1430  Gross per 24 hour  Intake 3226.67 ml  Output    850 ml  Net 2376.67 ml   Filed Weights   04/03/14 0449 04/04/14 0630 04/05/14 0610  Weight: 64.6 kg (142 lb 6.7 oz) 65.5 kg (144 lb 6.4 oz) 65.7 kg (144 lb 13.5 oz)      General appearance: alert, cooperative, appears stated age and no distress Resp: clear to auscultation bilaterally Cardio: regular rate and rhythm, S1, S2 normal, no murmur, click, rub or gallop GI: soft, non-tender; bowel sounds normal; no masses,  no organomegaly Extremities: extremities normal, atraumatic, no cyanosis or edema Neurologic: alert and oriented x 3. No focal deficits.  Lab Results:  Basic Metabolic Panel:  Recent Labs Lab 04/01/14 0616 04/02/14 0659 04/03/14 0548 04/04/14 0745 04/05/14 1125  NA 138 136 139 139 140  K 3.6 3.8 3.6 3.7 3.6  CL 104 107 109 109 108  CO2 29 24 22 25 25   GLUCOSE 129* 115* 89 102* 149*  BUN 14 9 7  <5* <5*  CREATININE 0.98 0.99 0.95 0.91 0.96  CALCIUM 8.5 8.6 8.4 8.6 8.8   Liver Function Tests:  Recent Labs Lab 03/31/14 1937 04/01/14 0616  AST 24 16  ALT 20 16  ALKPHOS 87 61  BILITOT 0.6 0.8  PROT 7.7 5.9*  ALBUMIN 4.4 3.2*    Recent Labs Lab 03/31/14 1937  LIPASE 32   CBC:  Recent Labs Lab 03/31/14 1937 04/01/14 0052 04/01/14 0616 04/01/14 1629 04/02/14 0659 04/03/14 0548 04/04/14 0745  WBC 11.9* 9.3 9.3 12.3* 10.0 8.5 6.6  NEUTROABS 9.1* 6.1 6.4  --   --   --   --   HGB 14.0 10.3* 11.7* 11.0* 10.2* 10.2* 10.3*  HCT 40.9 31.6* 34.8* 33.1* 30.3* 30.0* 30.2*  MCV 86.8 90.8 88.1 90.9 88.9 88.8 88.6  PLT 230 159 187 165 153 155 152     Studies/Results: No results found.  Medications:  Scheduled: . ciprofloxacin  500 mg Oral BID  . feeding supplement (RESOURCE BREEZE)  1 Container Oral BID BM  . metroNIDAZOLE  500 mg Oral 3 times per day  . pantoprazole  40 mg Oral BID  . simvastatin  20 mg Oral QHS  . sodium chloride  3 mL Intravenous Q12H  . traZODone  100 mg Oral QHS   Continuous:   ZOX:[id number] **OR** acetaminophen, acetaminophen, morphine injection, ondansetron **OR** ondansetron (ZOFRAN) IV  Assessment/Plan:  Principal Problem:   Colitis Active Problems:   Hypertension,  essential, benign   OA (osteoarthritis) of knee   GERD (gastroesophageal reflux disease)   Diverticulosis of large intestine   Diastolic dysfunction with chronic heart failure   Acute blood loss anemia     DVT Prophylaxis: SCDs    Code Status: Full code  Family Communication: Discussed with the patient Disposition Plan: home probably 2-5 if tolerating diet, if stable.     LOS: 5 days   Niel Hummer A  [facility] Pager [phone] 04/05/2014, 3:58 PM  If 7PM-7AM, please contact night-coverage at [email or web address], password [facility]

## 2014-04-06 LAB — CBC
HEMATOCRIT: 29.7 % — AB (ref 36.0–46.0)
Hemoglobin: 10.2 g/dL — ABNORMAL LOW (ref 12.0–15.0)
MCH: 29.7 pg (ref 26.0–34.0)
MCHC: 34.3 g/dL (ref 30.0–36.0)
MCV: 86.6 fL (ref 78.0–100.0)
PLATELETS: 192 10*3/uL (ref 150–400)
RBC: 3.43 MIL/uL — ABNORMAL LOW (ref 3.87–5.11)
RDW: 13.6 % (ref 11.5–15.5)
WBC: 5.6 10*3/uL (ref 4.0–10.5)

## 2014-04-06 LAB — BASIC METABOLIC PANEL
Anion gap: 7 (ref 5–15)
BUN: 5 mg/dL — ABNORMAL LOW (ref 6–23)
CALCIUM: 8.6 mg/dL (ref 8.4–10.5)
CO2: 31 mmol/L (ref 19–32)
Chloride: 103 mmol/L (ref 96–112)
Creatinine, Ser: 1.05 mg/dL (ref 0.50–1.10)
GFR calc Af Amer: 55 mL/min — ABNORMAL LOW (ref >90)
GFR, EST NON AFRICAN AMERICAN: 47 mL/min — AB (ref >90)
GLUCOSE: 114 mg/dL — AB (ref 70–99)
POTASSIUM: 3.3 mmol/L — AB (ref 3.5–5.1)
SODIUM: 141 mmol/L (ref 135–145)

## 2014-04-06 LAB — GI PATHOGEN PANEL BY PCR, STOOL
C difficile toxin A/B: NOT DETECTED
Campylobacter by PCR: NOT DETECTED
Cryptosporidium by PCR: NOT DETECTED
E coli (ETEC) LT/ST: NOT DETECTED
E coli (STEC): NOT DETECTED
E coli 0157 by PCR: NOT DETECTED
G lamblia by PCR: NOT DETECTED
NOROVIRUS G1/G2: NOT DETECTED
ROTAVIRUS A BY PCR: NOT DETECTED
SALMONELLA BY PCR: NOT DETECTED
Shigella by PCR: NOT DETECTED

## 2014-04-06 MED ORDER — BOOST / RESOURCE BREEZE PO LIQD: 1.0000 | Freq: Two times a day (BID) | ORAL | Status: DC

## 2014-04-06 MED ORDER — CIPROFLOXACIN HCL 500 MG PO TABS: 500.0000 mg | ORAL_TABLET | Freq: Two times a day (BID) | ORAL | Status: DC

## 2014-04-06 MED ORDER — METRONIDAZOLE 500 MG PO TABS: 500.0000 mg | ORAL_TABLET | Freq: Three times a day (TID) | ORAL | Status: DC

## 2014-04-06 MED ORDER — POTASSIUM CHLORIDE CRYS ER 20 MEQ PO TBCR
40.0000 meq | EXTENDED_RELEASE_TABLET | Freq: Once | ORAL | Status: AC
  Administered 2014-04-06: 40 meq via ORAL
  Filled 2014-04-06: qty 2

## 2014-04-06 MED ORDER — POTASSIUM CHLORIDE CRYS ER 20 MEQ PO TBCR: 40.0000 meq | EXTENDED_RELEASE_TABLET | Freq: Every day | ORAL | Status: DC

## 2014-04-06 NOTE — Discharge Summary (Signed)
Physician Discharge Summary  Cathyann [name] HTD:[id number] DOB: 1926/06/19 DOA: 03/31/2014  PCP: Marjorie Smolder, MD  Admit date: 03/31/2014 Discharge date: 04/06/2014  Time spent: 35 minutes  Recommendations for Outpatient Follow-up:  Please follow result of GI pathogen.  Need Bmet to follow potasium level.  Need CBC to follow Hb Needs to follow up with GI for further evaluation of colitis.  Resume aspirin if hb continue to be stable.   Discharge Diagnoses:  Principal Problem:   Colitis Active Problems:   Hypertension, essential, benign   OA (osteoarthritis) of knee   GERD (gastroesophageal reflux disease)   Diverticulosis of large intestine   Diastolic dysfunction with chronic heart failure   Acute blood loss anemia   Discharge Condition: Stable, improved.   Diet recommendation: Heart Healthy  Filed Weights   04/04/14 0630 04/05/14 0610 04/06/14 0618  Weight: 65.5 kg (144 lb 6.4 oz) 65.7 kg (144 lb 13.5 oz) 63.957 kg (141 lb)    History of present illness:  [name] Morton is a 81 y.o. female with Past medical history of hypertension, breast cancer, GERD, diverticulosis, chronic diastolic heart failure. The patient is presenting with complaints of abdominal pain. She mentions that at around known she started with abdominal pain which was located on lower abdomen and was severe and was 9 out of 10. After that she had an episode of nausea. After that 2 hours later she started having episodes of worsening of her abdominal pain in a wavelike fashion and felt like the pain was moving through her abdomen. She mentions that whenever she had a sense of worsening of the pain she had a sense of defecation as well. And whenever she went for a bowel movement she found that she had significant bright red blood with possible clots. She denies having any significant fecal material diarrhea. She denies any similar bleeding in the past. Her pain at present is tolerable 5 out of 10. She  had an episode of vomiting as well. She denies any prior abdominal pain after food. Denies any recent travel, recent antibiotic use, recent changes in her medication, fever, chills, nausea at the time of my evaluation, chest pain, shortness of breath. She mentions she has history of diverticulitis that was 3 years ago. No abdominal surgery as per patient.  Hospital Course:   Colitis with hematochezia CT scans shows pancolitis, suggesting infectious or inflammatory etiology. Could also be ischemic.  Hemoglobin remains stable.  ciprofloxacin and flagyl day 5. Discharge on 5 more days.  GI following.  Tolerating regular diet.  C diff negative. GI pathogen pending.   Acute blood loss anemia Hemoglobin is lower but still stable. No need for transfusion.  Essential Hypertension Blood pressure stable. Resume bistolic. [name] Kitchen  Chronic diastolic heart failure. Well compensated. Continue to monitor.  NSL fluids,  Resume lasix.   History of GERD. Continue Protonix.  Biliary ductal dilatation This was noted incidentally on her CT scan. LFTs are normal. Alkaline phosphatase is normal.  GI recommended MRCP which show mild duct dilation.  No further work up per GI.   Procedures:  none  Consultations:  GI, Gessner.   Discharge Exam: Filed Vitals:   04/06/14 0618  BP: 147/51  Pulse: 67  Temp: 98.3 F (36.8 C)  Resp: 18    General: Alert in no distress.  Cardiovascular: S 1, S 2 RRR Respiratory: CTA Abdomen; soft, NT  Discharge Instructions   Discharge Instructions    Diet - low sodium heart healthy  Complete by:  As directed      Increase activity slowly    Complete by:  As directed           Current Discharge Medication List    START taking these medications   Details  ciprofloxacin (CIPRO) 500 MG tablet Take 1 tablet (500 mg total) by mouth 2 (two) times daily. Qty: 10 tablet, Refills: 0    feeding supplement, RESOURCE BREEZE, (RESOURCE BREEZE) LIQD  Take 1 Container by mouth 2 (two) times daily between meals. Qty: 30 Container, Refills: 0    metroNIDAZOLE (FLAGYL) 500 MG tablet Take 1 tablet (500 mg total) by mouth every 8 (eight) hours. Qty: 15 tablet, Refills: 0    potassium chloride SA (K-DUR,KLOR-CON) 20 MEQ tablet Take 2 tablets (40 mEq total) by mouth daily. Qty: 20 tablet, Refills: 0      CONTINUE these medications which have NOT CHANGED   Details  acetaminophen (TYLENOL) 500 MG tablet Take 500 mg by mouth every 6 (six) hours as needed for moderate pain.    bisoprolol (ZEBETA) 5 MG tablet Take 1 tablet (5 mg total) by mouth daily. Qty: 90 tablet, Refills: 3    furosemide (LASIX) 40 MG tablet Take 1 tablet (40 mg total) by mouth daily. Qty: 90 tablet, Refills: 3    simvastatin (ZOCOR) 20 MG tablet Take 1 tablet (20 mg total) by mouth at bedtime. Qty: 90 tablet, Refills: 3    traZODone (DESYREL) 50 MG tablet Take 100 mg by mouth at bedtime.      STOP taking these medications     amLODipine-benazepril (LOTREL) 10-20 MG per capsule      aspirin 81 MG tablet      Docusate Calcium (STOOL SOFTENER PO)      polyethylene glycol (MIRALAX / GLYCOLAX) packet        Allergies  Allergen Reactions  . Benadryl [Diphenhydramine Hcl (Sleep)]     Makes me hyper   . Cardizem [Diltiazem Hcl]     Doesn't remember. Dr Rex Kras told patient not to take.   [name] Kitchen Epinephrine     unknown  . Vistaril [Hydroxyzine]     unknown  . Vytorin [Ezetimibe-Simvastatin]     Not comfortable taking it. Doesn't remember reaction.    Follow-up Information    Follow up with Wonda Horner, MD In 2 weeks.   Specialty:  Gastroenterology   Contact information:   [address], [place] [address] [phone]       Follow up with Marjorie Smolder, MD In 1 week.   Specialty:  Family Medicine   Contact information:   [place] [address] [phone]        The results of significant  diagnostics from this hospitalization (including imaging, microbiology, ancillary and laboratory) are listed below for reference.    Significant Diagnostic Studies: Ct Abdomen Pelvis W Contrast  03/31/2014   CLINICAL DATA:  Left lower quadrant pain since 12 noon today. Rectal bleeding. History of diverticulitis. History of breast cancer. Previous hysterectomy, appendectomy, lumpectomy of the breast.  EXAM: CT ABDOMEN AND PELVIS WITH CONTRAST  TECHNIQUE: Multidetector CT imaging of the abdomen and pelvis was performed using the standard protocol following bolus administration of intravenous contrast.  CONTRAST:  3mL OMNIPAQUE IOHEXOL 300 MG/ML  SOLN  COMPARISON:  None.  FINDINGS: Atelectasis in the lung bases. Small pericardial effusion. Coronary artery calcifications.  Gallbladder is diffusely distended with mild wall thickening. No stones or infiltration  identified. Mild intra and extrahepatic bile duct dilatation. No obstructing stone or lesion identified. There appears to be a lipoma in the head of the pancreas. Mild pancreatic ductal dilatation. Suggest follow-up with elective MRI/MRCP for further evaluation and to exclude occult obstructing lesion. Sub cm circumscribed low-attenuation lesion in the lateral segment left lobe of the liver is too small to characterize but likely represents a cyst or hemangioma. The spleen, adrenal glands, inferior vena cava, and retroperitoneal lymph nodes are unremarkable. Diffuse calcification of the abdominal aorta and branch vessels. Infrarenal abdominal aorta and origin of the right iliac artery appear focally narrowed. Vessels are patent. Sub cm lesions in both kidneys likely representing small cysts. No hydronephrosis. Stomach and small bowel appear normal. Contrast material flows through the colon without evidence of bowel obstruction. Colon is incompletely distended but there is evidence of diffuse wall thickening involving the transverse and descending colon with  extension into the sigmoid region. Mild pericolonic stranding. No evidence of pneumatosis. Inferior mesenteric artery appears patent.Changes are consistent with colitis, possibly infectious or inflammatory. No free air or free fluid in the abdomen.  Pelvis: Diverticulosis of the sigmoid colon. No evidence of diverticulitis. Bladder is decompressed. Surgical absence of the uterus. No free air or free fluid in the abdomen. No pelvic mass or lymphadenopathy. Appendix is surgically absent. Degenerative changes in the spine. No destructive bone lesions.  IMPRESSION: Diffuse wall thickening of the transverse, sigmoid, and descending colon consistent with infectious or inflammatory colitis. Intra and extrahepatic bile duct dilatation, pancreatic ductal dilatation, and gallbladder distention without obvious cause. Consider follow-up elective MRI/MRCP to exclude underlying obstructing lesion. Mild gallbladder wall thickening. Small pericardial effusion.   Electronically Signed   By: Lucienne Capers M.D.   On: 03/31/2014 23:43   Mr 3d Recon At Scanner  04/03/2014   CLINICAL DATA:  Subsequent encounter for colitis. Biliary ductal dilatation on abdominal CT.  EXAM: MRI ABDOMEN WITHOUT AND WITH CONTRAST (INCLUDING MRCP)  TECHNIQUE: Multiplanar multisequence MR imaging of the abdomen was performed both before and after the administration of intravenous contrast. Heavily T2-weighted images of the biliary and pancreatic ducts were obtained, and three-dimensional MRCP images were rendered by post processing.  CONTRAST:  81mL MULTIHANCE GADOBENATE DIMEGLUMINE 529 MG/ML IV SOLN  COMPARISON:  CT 03/31/2014.  FINDINGS: Mild-to-moderate motion degradation throughout.  Lower chest: Mild cardiomegaly. Trace bilateral pleural fluid is likely physiologic. Right hemidiaphragm elevation.  Hepatobiliary: Lateral segment left liver lobe lesion is likely a cyst. Intrahepatic ducts are upper normal for patient age. No gallstones or gallbladder  wall thickening. No pericholecystic edema.  The common duct is mildly dilated for patient age. 11-12 mm in the porta hepatis. Upper normal 9-10 mm in this age group. Tapers minimally to 11 mm just above the pancreatic head. Example image 33 of series 7. No evidence of obstructive stone or mass.  Pancreas: Pancreatic duct is upper normal for patient age, 4 mm on image 27 of series 7. There is no evidence of acute pancreatitis. Positioned within the pancreatic head/ uncinate process is at 9 mm fat signal lesion, including image 38 of series 4. See also image 38 of the 03/31/2014 CT. This is positioned inferior to the common duct insertion into the ampulla.  Spleen: Normal  Adrenals/Urinary Tract: Normal adrenal glands. Bilateral small renal lesions which are likely cysts. No hydronephrosis.  Stomach/Bowel: Normal stomach and small bowel loops. Persistent edema surrounding the splenic flexure colon. Example image 42 series 4.  Vascular/Lymphatic: Extensive atherosclerosis within the  aorta and branch vessels. No retroperitoneal or retrocrural adenopathy.  Other: Trace ascites within the left pericolic gutter.  Musculoskeletal: No acute osseous abnormality.  IMPRESSION: 1. Motion degradation. 2. Mild common duct dilatation for age. No obstructive stone or mass identified. The intrahepatic ducts and pancreatic duct are felt to be within normal limits for patient age. If the bilirubin is elevated, or there is a strong clinical concern of ampullary stenosis/ occult ampullary lesion, ERCP could be performed. 3. 9 mm pancreatic head/uncinate process lipoma, positioned inferior to the insertion of the common duct into the ampulla. 4. Persistent splenic flexure colitis, in a distribution suspicious for a ischemia.   Electronically Signed   By: Abigail Miyamoto M.D.   On: 04/03/2014 08:28   Dg Chest Portable 1 View  03/31/2014   CLINICAL DATA:  Abdomen pain and hematochezia beginning at noon today.  EXAM: PORTABLE CHEST - 1 VIEW   COMPARISON:  04/25/2013  FINDINGS: There is stable right hemidiaphragm elevation. There is unchanged mild cardiomegaly. No airspace consolidation is evident. No large effusions are evident.  There is no significant interval change.  IMPRESSION: Cardiomegaly. Unchanged right hemidiaphragm elevation. No acute findings are evident   Electronically Signed   By: Andreas Newport M.D.   On: 03/31/2014 20:45   Mr Abd W/wo Cm/mrcp  04/03/2014   CLINICAL DATA:  Subsequent encounter for colitis. Biliary ductal dilatation on abdominal CT.  EXAM: MRI ABDOMEN WITHOUT AND WITH CONTRAST (INCLUDING MRCP)  TECHNIQUE: Multiplanar multisequence MR imaging of the abdomen was performed both before and after the administration of intravenous contrast. Heavily T2-weighted images of the biliary and pancreatic ducts were obtained, and three-dimensional MRCP images were rendered by post processing.  CONTRAST:  30mL MULTIHANCE GADOBENATE DIMEGLUMINE 529 MG/ML IV SOLN  COMPARISON:  CT 03/31/2014.  FINDINGS: Mild-to-moderate motion degradation throughout.  Lower chest: Mild cardiomegaly. Trace bilateral pleural fluid is likely physiologic. Right hemidiaphragm elevation.  Hepatobiliary: Lateral segment left liver lobe lesion is likely a cyst. Intrahepatic ducts are upper normal for patient age. No gallstones or gallbladder wall thickening. No pericholecystic edema.  The common duct is mildly dilated for patient age. 11-12 mm in the porta hepatis. Upper normal 9-10 mm in this age group. Tapers minimally to 11 mm just above the pancreatic head. Example image 33 of series 7. No evidence of obstructive stone or mass.  Pancreas: Pancreatic duct is upper normal for patient age, 4 mm on image 27 of series 7. There is no evidence of acute pancreatitis. Positioned within the pancreatic head/ uncinate process is at 9 mm fat signal lesion, including image 38 of series 4. See also image 38 of the 03/31/2014 CT. This is positioned inferior to the common  duct insertion into the ampulla.  Spleen: Normal  Adrenals/Urinary Tract: Normal adrenal glands. Bilateral small renal lesions which are likely cysts. No hydronephrosis.  Stomach/Bowel: Normal stomach and small bowel loops. Persistent edema surrounding the splenic flexure colon. Example image 42 series 4.  Vascular/Lymphatic: Extensive atherosclerosis within the aorta and branch vessels. No retroperitoneal or retrocrural adenopathy.  Other: Trace ascites within the left pericolic gutter.  Musculoskeletal: No acute osseous abnormality.  IMPRESSION: 1. Motion degradation. 2. Mild common duct dilatation for age. No obstructive stone or mass identified. The intrahepatic ducts and pancreatic duct are felt to be within normal limits for patient age. If the bilirubin is elevated, or there is a strong clinical concern of ampullary stenosis/ occult ampullary lesion, ERCP could be performed. 3. 9 mm pancreatic  head/uncinate process lipoma, positioned inferior to the insertion of the common duct into the ampulla. 4. Persistent splenic flexure colitis, in a distribution suspicious for a ischemia.   Electronically Signed   By: Abigail Miyamoto M.D.   On: 04/03/2014 08:28    Microbiology: Recent Results (from the past 240 hour(s))  Clostridium Difficile by PCR     Status: None   Collection Time: 04/03/14 12:51 AM  Result Value Ref Range Status   C difficile by pcr NEGATIVE NEGATIVE Final     Labs: Basic Metabolic Panel:  Recent Labs Lab 04/02/14 0659 04/03/14 0548 04/04/14 0745 04/05/14 1125 04/06/14 0620  NA 136 139 139 140 141  K 3.8 3.6 3.7 3.6 3.3*  CL 107 109 109 108 103  CO2 24 22 25 25 31   GLUCOSE 115* 89 102* 149* 114*  BUN 9 7 <5* <5* 5*  CREATININE 0.99 0.95 0.91 0.96 1.05  CALCIUM 8.6 8.4 8.6 8.8 8.6   Liver Function Tests:  Recent Labs Lab 03/31/14 1937 04/01/14 0616  AST 24 16  ALT 20 16  ALKPHOS 87 61  BILITOT 0.6 0.8  PROT 7.7 5.9*  ALBUMIN 4.4 3.2*    Recent Labs Lab  03/31/14 1937  LIPASE 32   No results for input(s): AMMONIA in the last 168 hours. CBC:  Recent Labs Lab 03/31/14 1937 04/01/14 0052 04/01/14 3976 04/01/14 1629 04/02/14 0659 04/03/14 0548 04/04/14 0745 04/06/14 0620  WBC 11.9* 9.3 9.3 12.3* 10.0 8.5 6.6 5.6  NEUTROABS 9.1* 6.1 6.4  --   --   --   --   --   HGB 14.0 10.3* 11.7* 11.0* 10.2* 10.2* 10.3* 10.2*  HCT 40.9 31.6* 34.8* 33.1* 30.3* 30.0* 30.2* 29.7*  MCV 86.8 90.8 88.1 90.9 88.9 88.8 88.6 86.6  PLT 230 159 187 165 153 155 152 192   Cardiac Enzymes: No results for input(s): CKTOTAL, CKMB, CKMBINDEX, TROPONINI in the last 168 hours. BNP: BNP (last 3 results) No results for input(s): BNP in the last 8760 hours.  ProBNP (last 3 results) No results for input(s): PROBNP in the last 8760 hours.  CBG: No results for input(s): GLUCAP in the last 168 hours.     Signed:  Niel Hummer A  [facility] 04/06/2014, 10:18 AM

## 2014-04-06 NOTE — Progress Notes (Signed)
Physical Therapy Treatment Patient Details Name: [name] Morton MRN: [id number] DOB: 08/23/1926 Today's Date: 04/06/2014    History of Present Illness Pt admitted 03-31-14 with colitis.    PT Comments    Pt was seen for evaluation of her strength and ROM and to assess stair tolerance.  Pt is independent with all using single hand rail for stairs, able to go home with her brother and with equipment already there.  No further therapy needs.  Follow Up Recommendations  No PT follow up;Supervision/Assistance - 24 hour     Equipment Recommendations  None recommended by PT    Recommendations for Other Services       Precautions / Restrictions Precautions Precautions: Fall Restrictions Weight Bearing Restrictions: No    Mobility  Bed Mobility Overal bed mobility: Modified Independent                Transfers Overall transfer level: Modified independent Equipment used: None Transfers: Sit to/from [facility] Sit to Stand: Modified independent (Device/Increase time) Stand pivot transfers: Modified independent (Device/Increase time)       General transfer comment: controlled her balance with no AD  Ambulation/Gait Ambulation/Gait assistance: Min guard;Supervision (for safety) Ambulation Distance (Feet): 250 Feet Assistive device: None Gait Pattern/deviations: Step-through pattern;Decreased stance time - left;Decreased weight shift to left;Wide base of support Gait velocity: decreased Gait velocity interpretation: Below normal speed for age/gender General Gait Details: steadiness wtih gait but is slower, recent L TKA   Stairs Stairs: Yes   Stair Management: One rail Right;Alternating pattern;Step to pattern Number of Stairs: 13 General stair comments: safe with rail  Wheelchair Mobility    Modified Rankin (Stroke Patients Only)       Balance Overall balance assessment: Modified Independent Sitting-balance support: Feet  supported Sitting balance-Leahy Scale: Good     Standing balance support: No upper extremity supported Standing balance-Leahy Scale: Good Standing balance comment: No AD needed                    Cognition Arousal/Alertness: Awake/alert Behavior During Therapy: WFL for tasks assessed/performed Overall Cognitive Status: Within Functional Limits for tasks assessed                      Exercises General Exercises - Lower Extremity Ankle Circles/Pumps: AROM;Both;5 reps Long Arc Quad: AROM;Strengthening;Left;5 reps Heel Slides: AROM;Left;5 reps Hip ABduction/ADduction: AROM;Both;5 reps Other Exercises Other Exercises: reviewed correct technique for ankle pumps due to stiffness on L ankle    General Comments General comments (skin integrity, edema, etc.): Pt is in a good situation  for home with her brother taking her and has all the equipment there since his wife was TKA      Pertinent Vitals/Pain Pain Assessment: Faces Pain Score: 4  Faces Pain Scale: Hurts little more Pain Location: L knee Pain Intervention(s): Limited activity within patient's tolerance;Monitored during session;Repositioned    Home Living                      Prior Function            PT Goals (current goals can now be found in the care plan section) Acute Rehab PT Goals Patient Stated Goal: to go home with her brother Progress towards PT goals: Progressing toward goals    Frequency  Min 3X/week    PT Plan Current plan remains appropriate    Co-evaluation  End of Session   Activity Tolerance: Patient tolerated treatment well Patient left: in chair;with call bell/phone within reach     Time: 1150-1212 PT Time Calculation (min) (ACUTE ONLY): 22 min  Charges:  $Gait Training: 8-22 mins                    G Codes:      Ramond Dial 15-Apr-2014, 1:45 PM  Mee Hives, PT MS Acute Rehab Dept. Number: [phone]

## 2014-04-06 NOTE — Progress Notes (Signed)
CARE MANAGEMENT NOTE 04/06/2014  Patient:  [name] Morton,[name]   Account Number:  [id number]  Date Initiated:  04/03/2014  Documentation initiated by:  [name] Morton  Subjective/Objective Assessment:   Dx:  GI Bleed  Lives at home alone     Action/Plan:   pt eval- no pt f/u  [facility] referral   Anticipated DC Date:  04/06/2014   Anticipated DC Plan:  [place]  CM consult      Choice offered to / List presented to:             Status of service:  Completed, signed off Medicare Important Message given?  YES (If response is "NO", the following Medicare IM given date fields will be blank) Date Medicare IM given:  04/02/2014 Medicare IM given by:  [name] Morton Date Additional Medicare IM given:  04/06/2014 Additional Medicare IM given by:  [name] Morton  Discharge Disposition:  HOME/SELF CARE  Per UR Regulation:  Reviewed for med. necessity/level of care/duration of stay  If discussed at [place] of Stay Meetings, dates discussed:    Comments:  04/06/2014 1226 NCM spoke to pt and no dc needs identified. States she will stay with her brother for a few days. Has RW, wheelchair and bedside commode at home. [name] Finner RN CCM Case Mgmt phone [phone]  04/05/14 [name] Bamberger RN ,BSN [phone] [facility] referral, per physical therapy no pt f/u needed.  THN will see patient on 2/5.

## 2014-04-06 NOTE — Progress Notes (Signed)
Patient iv came out, and she is extremely difficult stick. Notified Rogue Bussing and ok to leave out for now since meds PO. Will continue to monitor.

## 2014-04-06 NOTE — Consult Note (Signed)
Following up on [place] Management referral received from MD Director from long length of stay meeting on yesterday. Came to bedside today and explained [facility] Care Management services to patient. 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 Patient pleasantly declines [facility] Care Management at this time. Will make inpatient RNCM aware. Marthenia Rolling, MSN-RN,BSN- [facility] [phone]

## 2014-04-06 NOTE — Progress Notes (Signed)
Pt. Received discharge instructions and prescriptions. Educated pt. On follow-up appointments. No skin issues noted. All questions answered. No further needs noted at this time.

## 2014-04-12 DIAGNOSIS — G4700 Insomnia, unspecified: Secondary | ICD-10-CM | POA: Diagnosis not present

## 2014-04-12 DIAGNOSIS — D62 Acute posthemorrhagic anemia: Secondary | ICD-10-CM | POA: Diagnosis not present

## 2014-04-12 DIAGNOSIS — I10 Essential (primary) hypertension: Secondary | ICD-10-CM | POA: Diagnosis not present

## 2014-05-01 DIAGNOSIS — Z96651 Presence of right artificial knee joint: Secondary | ICD-10-CM | POA: Diagnosis not present

## 2014-05-01 DIAGNOSIS — Z471 Aftercare following joint replacement surgery: Secondary | ICD-10-CM | POA: Diagnosis not present

## 2014-05-01 DIAGNOSIS — Z96652 Presence of left artificial knee joint: Secondary | ICD-10-CM | POA: Diagnosis not present

## 2014-05-02 DIAGNOSIS — K559 Vascular disorder of intestine, unspecified: Secondary | ICD-10-CM | POA: Diagnosis not present

## 2014-05-03 DIAGNOSIS — H903 Sensorineural hearing loss, bilateral: Secondary | ICD-10-CM | POA: Diagnosis not present

## 2014-05-10 DIAGNOSIS — I10 Essential (primary) hypertension: Secondary | ICD-10-CM | POA: Diagnosis not present

## 2014-05-21 DIAGNOSIS — H903 Sensorineural hearing loss, bilateral: Secondary | ICD-10-CM | POA: Diagnosis not present

## 2014-06-06 DIAGNOSIS — H16103 Unspecified superficial keratitis, bilateral: Secondary | ICD-10-CM | POA: Diagnosis not present

## 2014-06-06 DIAGNOSIS — H43813 Vitreous degeneration, bilateral: Secondary | ICD-10-CM | POA: Diagnosis not present

## 2014-06-06 DIAGNOSIS — Z961 Presence of intraocular lens: Secondary | ICD-10-CM | POA: Diagnosis not present

## 2014-06-06 DIAGNOSIS — H04123 Dry eye syndrome of bilateral lacrimal glands: Secondary | ICD-10-CM | POA: Diagnosis not present

## 2014-10-17 DIAGNOSIS — Z1389 Encounter for screening for other disorder: Secondary | ICD-10-CM | POA: Diagnosis not present

## 2014-10-17 DIAGNOSIS — B372 Candidiasis of skin and nail: Secondary | ICD-10-CM | POA: Diagnosis not present

## 2014-10-17 DIAGNOSIS — Z0000 Encounter for general adult medical examination without abnormal findings: Secondary | ICD-10-CM | POA: Diagnosis not present

## 2014-10-17 DIAGNOSIS — E780 Pure hypercholesterolemia: Secondary | ICD-10-CM | POA: Diagnosis not present

## 2014-10-17 DIAGNOSIS — I10 Essential (primary) hypertension: Secondary | ICD-10-CM | POA: Diagnosis not present

## 2014-10-17 DIAGNOSIS — N183 Chronic kidney disease, stage 3 (moderate): Secondary | ICD-10-CM | POA: Diagnosis not present

## 2014-10-19 ENCOUNTER — Encounter: Payer: Self-pay | Admitting: Cardiology

## 2014-10-29 ENCOUNTER — Telehealth: Payer: Self-pay | Admitting: *Deleted

## 2014-10-29 NOTE — Telephone Encounter (Signed)
-----   Message from Leonie Man, MD sent at 10/29/2014  1:21 PM EDT ----- Recent Labs: 10/17/2014 Na+ 141, K+ 4.2, Cl- 103, HCO3- 31 , BUN 23, Cr 1.28, Glu 97, Ca2+ 9.9; AST 18, ALT 21; AlkP 64, Alb 4.8, TP 7.4, T Bili 0.4  TC 202, TG 121, HDL 77, LDL 101   Overall, her labs look pretty good. Kidney function and liver function look good. Cholesterol levels are pretty much at goal.   Leonie Man, MD

## 2014-10-29 NOTE — Telephone Encounter (Signed)
NO ANSWER , WILL CALL BACK

## 2014-10-30 NOTE — Telephone Encounter (Signed)
Spoke to [employer]. Result given . Verbalized understanding

## 2014-11-27 NOTE — Progress Notes (Signed)
PCP: Marjorie Smolder, MD  Clinic Note: Chief Complaint  Patient presents with  . Annual Exam    pt c/o swelling in left foot/ankle//had complete knee replacement  . Hypertension    HPI: [name] Morton [facility]) is a 81 y.o. female with a PMH below who presents today for annual follow-up for evaluation of HTN, HLD & LE edema. She had been seeing Dr. Aldona Bar for years prior to changing to me after his retirement. She has had a cardiac workup in the distant past, but has not had any notable cardiac issues.[name] Morton was last seen in October 2015 -- she was doing well without any major complaints besides knee stiffness. She had mouth ulcers and therefore was not eating well. She had lost about 18 pounds.  Recent Hospitalizations: 03/31/2014:   Admitted for abdominal pain in the lower abdomen.   Diagnosed with pancolitis and hematochezia --  infectious versus inflammatory versus ischemic    treated with Cipro and Flagyl for 10 days    mildly anemic but no transfusion.   Continued on Bystolic  - now on Bisoprolol  Studies Reviewed: No new studies. See labs below   Interval History: [name] Morton presents today really without any major cardiac complaints. She fell couple days ago and was unable to get up due to discomfort in her left knee. Since then she's had swelling in her left knee and ankle. Otherwise no edema. Interestingly, something that had not been embolus in the past is that she has had a history of several syncopal events that were mostly vasovagal in nature. She knows exactly what his triggers it including pain and nausea, and high stress. He can also occasionally usually happen if she is constipated. By now, she knows that when the symptoms come on when she tries go down floor to avoid falling. Has not had a injury from an episode in quite some time.  She does note that she gets a bit SOB if she over does it.    No chest pain or shortness of breath with rest or  normal  exertion.  No PND, orthopnea or edema.  No palpitations, lightheadedness, dizziness, weakness or syncope/near syncope. No TIA/amaurosis fugax symptoms. No melena, hematochezia, hematuria, or epstaxis. No claudication.  ROS: A comprehensive was performed. Review of Systems  Constitutional: Negative for weight loss (She actually put back on weight that she had previously lost).  HENT: Negative for nosebleeds.   Respiratory: Negative for cough and shortness of breath.   Cardiovascular: Positive for leg swelling (L foot - related to prior L knee Sgx). Negative for orthopnea.       H/o Vasovagal syncope with colitis   Gastrointestinal: Negative for heartburn, blood in stool and melena.       No further abdominal issues  Musculoskeletal: Positive for joint pain (Left knee pain still residual from her knee replacement surgery over a year ago) and falls.  Neurological: Positive for dizziness (occasionally positional with bending over).  Psychiatric/Behavioral: Negative.   All other systems reviewed and are negative.    Past Medical History  Diagnosis Date  . History of nuclear stress test 11/2009    dipyridamole; no evidence of ischemia, normal, low risk   . Dyslipidemia   . Breast cancer 1997    right - tx with lumpectomty & radiation  . Hearing impairment     BILATERAL HEARING AIDS  . History of shingles 1998    NO RESIDUAL PROBLEMS  . Sinus problem   .  Shortness of breath     WHEN CLIMBING STEPS AT MY HOME--OTHERWISE OK  . GERD (gastroesophageal reflux disease)     RARE - NO MEDICATIONS  . Lymphedema of arm     RIGHT   . OA (osteoarthritis)     OA AND PAIN LEFT KNEE  . Diverticulosis   . Sleep difficulties     ALWAYS HAD SLEEPING PROBLEMS - MEDICATION HELPS PT SLEEP ABOUT 3 HOURS  . Hypertension     SEES CARDIOLOGIST [name] Morton FOR HYPERTENSION     Past Surgical History  Procedure Laterality Date  . Replacement total knee  2002  . Rotator cuff repair  Right 12/2009    Dr. Lenna Sciara. [place]   . Appendectomy  1951  . Abdominal hysterectomy  1973  . Transthoracic echocardiogram  05/1994    trace mitral/tricuspic/aortic inusff; hyperkinetic LV  . Eye surgery      BILATERAL CATARACT EXTRACTIONS AND LENS IMPLANTS  . Breast lumpectomy Right 1997  . Breast adenoma  1950    EXCISED  . Total knee arthroplasty Left 05/01/2013    Procedure: LEFT TOTAL KNEE ARTHROPLASTY;  Surgeon: [name] Alf, MD;  Location: [facility];  Service: Orthopedics;  Laterality: Left;    Prior to Admission medications   Medication Sig Start Date End Date Taking? Authorizing Provider  acetaminophen (TYLENOL) 500 MG tablet Take 500 mg by mouth every 6 (six) hours as needed for moderate pain.    Historical Provider, MD  bisoprolol (ZEBETA) 5 MG tablet Take 1 tablet (5 mg total) by mouth daily. 02/01/14   [name] Man, MD  ciprofloxacin (CIPRO) 500 MG tablet Take 1 tablet (500 mg total) by mouth 2 (two) times daily. 04/06/14   [name] A Regalado, MD  feeding supplement, RESOURCE BREEZE, (RESOURCE BREEZE) LIQD Take 1 Container by mouth 2 (two) times daily between meals. 04/06/14   [name] A Regalado, MD  furosemide (LASIX) 40 MG tablet Take 1 tablet (40 mg total) by mouth daily. 02/01/14   [name] Man, MD  metroNIDAZOLE (FLAGYL) 500 MG tablet Take 1 tablet (500 mg total) by mouth every 8 (eight) hours. 04/06/14   [name] A Regalado, MD  potassium chloride SA (K-DUR,KLOR-CON) 20 MEQ tablet Take 2 tablets (40 mEq total) by mouth daily. 04/06/14   [name] A Regalado, MD  simvastatin (ZOCOR) 20 MG tablet Take 1 tablet (20 mg total) by mouth at bedtime. 08/16/12   [name] Man, MD  traZODone (DESYREL) 50 MG tablet Take 100 mg by mouth at bedtime.    Historical Provider, MD   Allergies  Allergen Reactions  . Benadryl [Diphenhydramine Hcl (Sleep)]     Makes me hyper   . Cardizem [Diltiazem Hcl]     Doesn't remember. Dr Rex Kras told patient not to take.   [name] Kitchen Epinephrine     unknown  .  Vistaril [Hydroxyzine]     unknown  . Vytorin [Ezetimibe-Simvastatin]     Not comfortable taking it. Doesn't remember reaction.      Social History   Social History  . Marital Status: Widowed    Spouse Name: N/A  . Number of Children: 2  . Years of Education: N/A   Occupational History  . retired Therapist, sports    Social History Main Topics  . Smoking status: Never Smoker   . Smokeless tobacco: Never Used  . Alcohol Use: No  . Drug Use: No  . Sexual Activity: Not Asked   Other Topics Concern  . None  Social History Narrative   Widowed since 11/2010.  Mother of 2.  No real exercise due to knee.   Smoki: No   EtOH: NO   Family History  Problem Relation Age of Onset  . Heart attack Mother   . Heart Problems Father   . Heart Problems Maternal Grandmother   . Heart Problems Maternal Grandfather   . Asthma Maternal Grandfather   . Heart attack Brother 58  . Heart disease Brother     CABG  . Lung cancer Sister   . Emphysema Sister   . Cancer Sister   . Heart Problems Sister     also asthma  . Narcolepsy Child   . Arthritis Child     Wt Readings from Last 3 Encounters:  11/28/14 143 lb 6.4 oz (65.046 kg)  04/06/14 141 lb (63.957 kg)  11/30/13 130 lb 12.8 oz (59.33 kg)    PHYSICAL EXAM BP 142/60 mmHg  Pulse 70  Ht 5' 0.35" (1.533 m)  Wt 143 lb 6.4 oz (65.046 kg)  BMI 27.68 kg/m2 General appearance: alert, cooperative, appears stated age, no distress and Well-nourished and well-groomed. Pleasant mood and affect. Answers questions appropriately  HEENT: [place]/AT, EOMI, MMM, anicteric sclera Neck: no adenopathy, no carotid bruit, no JVD and supple, symmetrical, trachea midline  Lungs: clear to auscultation bilaterally, normal percussion bilaterally and Nonlabored, good air movement  Heart: RRR, S1, S2 normal, no S3 or S4 and Soft 1/6 SEM at RUSB radiating to carotids. No other M./R./G. nondisplaced PMI  Abdomen: soft, non-tender; bowel sounds normal; no masses, no  organomegaly  Extremities: extremities normal, atraumatic, no cyanosis or edema and varicose veins noted  Pulses: 2+ and symmetric  Neurologic: Grossly normal   Adult ECG Report  Rate: 70 ;  Rhythm: normal sinus rhythm and Normal axis, intervals and durations.;   Narrative Interpretation: Normal/stable EKG   Other studies Reviewed: Additional studies/ records that were reviewed today include:  Recent Labs: 10/17/2014 Na+ 141, K+ 4.2, Cl- 103, HCO3- 31 , BUN 23, Cr 1.28, Glu 97, Ca2+ 9.9; AST 18, ALT 21; AlkP 64, Alb 4.8, TP 7.4, T Bili 0.4 TC 202, TG 121, HDL 77, LDL 101 -- Overall, her labs look pretty good. Kidney function and liver function look good. Cholesterol levels are pretty much at goal.    ASSESSMENT / PLAN: Problem List Items Addressed This Visit    Essential hypertension - Primary (Chronic)    She is pretty much at the acceptable level of her blood pressures. With somewhat steady dizziness, reluctant to be more aggressive.      Relevant Medications   aspirin 81 MG tablet   amLODipine-benazepril (LOTREL) 10-20 MG capsule   Other Relevant Orders   EKG 12-Lead (Completed)   Dyslipidemia, goal LDL below 100 (Chronic)    Recent labs showed pretty good control of lipids on current dose of statin. For her this is probably pretty much at goal. Would not be more aggressive than 20 mg of simvastatin.      Relevant Medications   aspirin 81 MG tablet   amLODipine-benazepril (LOTREL) 10-20 MG capsule   Other Relevant Orders   EKG 12-Lead (Completed)   Diastolic dysfunction with chronic heart failure (Chronic)   Relevant Medications   aspirin 81 MG tablet   amLODipine-benazepril (LOTREL) 10-20 MG capsule   Other Relevant Orders   EKG 12-Lead (Completed)   Bilateral leg edema (Chronic)    Pretty stable. Not much the way of any significant swelling that can  be managed by her standing dose of Lasix. She has not had any additional doses.         Current medicines are  reviewed at length with the patient today. (+/- concerns) none The following changes have been made: None Studies Ordered:   Orders Placed This Encounter  Procedures  . EKG 12-Lead      [name] Morton, M.D., M.S. Interventional Cardiologist   Pager # [phone]

## 2014-11-28 ENCOUNTER — Ambulatory Visit (INDEPENDENT_AMBULATORY_CARE_PROVIDER_SITE_OTHER): Payer: Medicare Other | Admitting: Cardiology

## 2014-11-28 VITALS — BP 142/60 | HR 70 | Ht 60.35 in | Wt 143.4 lb

## 2014-11-28 DIAGNOSIS — I5032 Chronic diastolic (congestive) heart failure: Secondary | ICD-10-CM | POA: Diagnosis not present

## 2014-11-28 DIAGNOSIS — E785 Hyperlipidemia, unspecified: Secondary | ICD-10-CM

## 2014-11-28 DIAGNOSIS — R600 Localized edema: Secondary | ICD-10-CM

## 2014-11-28 DIAGNOSIS — I10 Essential (primary) hypertension: Secondary | ICD-10-CM

## 2014-11-28 NOTE — Patient Instructions (Signed)
No change in current medications   Your physician wants you to follow-up in 12 months with Dr Ellyn Hack. You will receive a reminder letter in the mail two months in advance. If you don't receive a letter, please call our office to schedule the follow-up appointment.

## 2014-11-29 ENCOUNTER — Encounter: Payer: Self-pay | Admitting: Cardiology

## 2014-11-29 NOTE — Assessment & Plan Note (Signed)
Recent labs showed pretty good control of lipids on current dose of statin. For her this is probably pretty much at goal. Would not be more aggressive than 20 mg of simvastatin.

## 2014-11-29 NOTE — Assessment & Plan Note (Signed)
Pretty stable. Not much the way of any significant swelling that can be managed by her standing dose of Lasix. She has not had any additional doses.

## 2014-11-29 NOTE — Assessment & Plan Note (Signed)
She is pretty much at the acceptable level of her blood pressures. With somewhat steady dizziness, reluctant to be more aggressive.

## 2014-12-20 DIAGNOSIS — Z23 Encounter for immunization: Secondary | ICD-10-CM | POA: Diagnosis not present

## 2015-01-08 ENCOUNTER — Other Ambulatory Visit: Payer: Self-pay | Admitting: Cardiology

## 2015-01-14 ENCOUNTER — Other Ambulatory Visit: Payer: Self-pay | Admitting: Cardiology

## 2015-03-19 DIAGNOSIS — M5137 Other intervertebral disc degeneration, lumbosacral region: Secondary | ICD-10-CM | POA: Diagnosis not present

## 2015-03-19 DIAGNOSIS — M545 Low back pain: Secondary | ICD-10-CM | POA: Diagnosis not present

## 2015-05-08 DIAGNOSIS — H903 Sensorineural hearing loss, bilateral: Secondary | ICD-10-CM | POA: Diagnosis not present

## 2015-05-22 DIAGNOSIS — R269 Unspecified abnormalities of gait and mobility: Secondary | ICD-10-CM | POA: Diagnosis not present

## 2015-05-22 DIAGNOSIS — R5383 Other fatigue: Secondary | ICD-10-CM | POA: Diagnosis not present

## 2015-05-29 ENCOUNTER — Ambulatory Visit: Payer: Medicare Other | Attending: Family Medicine | Admitting: Physical Therapy

## 2015-05-29 ENCOUNTER — Encounter: Payer: Self-pay | Admitting: Physical Therapy

## 2015-05-29 DIAGNOSIS — M25662 Stiffness of left knee, not elsewhere classified: Secondary | ICD-10-CM | POA: Diagnosis not present

## 2015-05-29 DIAGNOSIS — M79605 Pain in left leg: Secondary | ICD-10-CM | POA: Insufficient documentation

## 2015-05-29 DIAGNOSIS — R2689 Other abnormalities of gait and mobility: Secondary | ICD-10-CM

## 2015-05-29 NOTE — Patient Instructions (Signed)
Chair Sitting    Sit at edge of seat, spine straight, one leg extended. Put a hand on each thigh and bend forward from the hip, keeping spine straight. Allow hand on extended leg to reach toward toes. Support upper body with other arm. Hold _30__ seconds. Repeat _2__ times per session. Do _1__ sessions per day.  Copyright  VHI. All rights reserved.  SIT TO STAND: No Device    Sit with feet shoulder-width apart, on floor. Lean chest forward, raise hips up from surface. Straighten hips and knees. Weight bear equally on left and right sides. __5_ reps per set, _2__ sets per day, _  Copyright  VHI. All rights reserved.  Knee Extension (Sitting)    straighten knee fully, lower slowly. Repeat _20___ times per set. Do _1___ sets per session. Do __1__ sessions per day. Both legs http://orth.exer.us/733   Copyright  VHI. All rights reserved.  [facility] [address] [place], [place] [address] Phone # [phone] Fax [phone]

## 2015-05-29 NOTE — Therapy (Signed)
[facility] [address], [place], [place], [address] Phone: [phone]   Fax:  [phone]  Physical Therapy Evaluation  Patient Details  Name: [name] Morton MRN: [id number] Date of Birth: November 06, 1926 Referring Provider: Dr. Butch Penny GAtes  Encounter Date: 05/29/2015      PT End of Session - 05/29/15 1307    Visit Number 1   Number of Visits 10  medicare   Date for PT Re-Evaluation 07/24/15   Authorization Type [employer]; g-code on 10th visit, Kx modifier for 15 th   PT Start Time 1230   PT Stop Time 1305   PT Time Calculation (min) 35 min   Activity Tolerance Patient tolerated treatment well   Behavior During Therapy [facility] for tasks assessed/performed      Past Medical History  Diagnosis Date  . History of nuclear stress test 11/2009    dipyridamole; no evidence of ischemia, normal, low risk   . Dyslipidemia   . Breast cancer ([place]) 1997    right - tx with lumpectomty & radiation  . Hearing impairment     BILATERAL HEARING AIDS  . History of shingles 1998    NO RESIDUAL PROBLEMS  . Sinus problem   . Shortness of breath     WHEN CLIMBING STEPS AT MY HOME--OTHERWISE OK  . GERD (gastroesophageal reflux disease)     RARE - NO MEDICATIONS  . Lymphedema of arm     RIGHT   . OA (osteoarthritis)     OA AND PAIN LEFT KNEE  . Diverticulosis   . Sleep difficulties     ALWAYS HAD SLEEPING PROBLEMS - MEDICATION HELPS PT SLEEP ABOUT 3 HOURS  . Hypertension     SEES CARDIOLOGIST DR. DAVID HARDING FOR HYPERTENSION     Past Surgical History  Procedure Laterality Date  . Replacement total knee  2002  . Rotator cuff repair Right 12/2009    Dr. Lenna Sciara. [place]   . Appendectomy  1951  . Abdominal hysterectomy  1973  . Transthoracic echocardiogram  05/1994    trace mitral/tricuspic/aortic inusff; hyperkinetic LV  . Eye surgery      BILATERAL CATARACT EXTRACTIONS AND LENS IMPLANTS  . Breast lumpectomy Right 1997  .  Breast adenoma  1950    EXCISED  . Total knee arthroplasty Left 05/01/2013    Procedure: LEFT TOTAL KNEE ARTHROPLASTY;  Surgeon: Gearlean Alf, MD;  Location: [facility];  Service: Orthopedics;  Laterality: Left;    There were no vitals filed for this visit.  Visit Diagnosis:  Other abnormalities of gait and mobility - Plan: PT plan of care cert/re-cert  Pain In Left Leg - Plan: PT plan of care cert/re-cert  Other stiff joint, of the lower leg, left - Plan: PT plan of care cert/re-cert      Subjective Assessment - 05/29/15 1235    Subjective My daughter sent me here.  Since left knee replacement, I did not get to where the left knee is normal. Patient reports she fell 5 weeks ago while feeding birds.  Patient was wearing slip on shoes. Patient could not get up due to not being able to put pressure on left knee.    Patient Stated Goals be able to get up from a floor   Currently in Pain? Yes   Pain Score 5    Pain Location Knee   Pain Orientation Left   Pain Descriptors / Indicators Sore   Pain Type Chronic pain   Pain  Onset More than a month ago   Pain Frequency Intermittent   Aggravating Factors  laying down, stairs   Pain Relieving Factors rest   Multiple Pain Sites No            [facility] PT Assessment - 05/29/15 0001    Assessment   Medical Diagnosis R26.9 Gait difficulty   Referring Provider Dr. Butch Penny GAtes   Onset Date/Surgical Date 04/24/15   Prior Therapy none   Precautions   Precautions Other (comment)   Precaution Comments Cancer precautions   Balance Screen   Has the patient fallen in the past 6 months Yes   How many times? 1   Has the patient had a decrease in activity level because of a fear of falling?  No   Is the patient reluctant to leave their home because of a fear of falling?  No   Home Environment   Living Environment Private residence   Living Arrangements Alone   Type of [place] Access Level entry   Home Layout Multi-level   Alternate  Level Stairs-Rails Left;Right   Prior Function   Level of [place] Retired   Associate Professor   Overall Cognitive Status Within Functional Limits for tasks assessed   Behaviors --  hard to hear   Observation/Other Assessments   Focus on Therapeutic Outcomes (FOTO)  72% limitation  goal is 56% limitation   ROM / Strength   AROM / PROM / Strength AROM;PROM;Strength   AROM   Right/Left Knee Left   Left Knee Extension -20  sitting   Left Knee Flexion 90  supine   PROM   Right/Left Knee Left   Left Knee Extension -8   Left Knee Flexion 100   Strength   Strength Assessment Site Knee;Hip   Left Hip Flexion 3+/5   Left Hip Extension 4-/5   Left Hip ABduction 4-/5   Right/Left Knee Left   Left Knee Flexion 4/5   Left Knee Extension 3+/5   Ambulation/Gait   Ambulation/Gait Yes   Ambulation/Gait Assistance 7: Independent   Assistive device None  uses the quad cane on occasion   Gait Pattern Decreased dorsiflexion - right;Decreased dorsiflexion - left;Decreased stride length   Ambulation Surface Level   Stairs Yes   Stairs Assistance 7: Independent   Stair Management Technique Two rails;Step to pattern   Standardized Balance Assessment   Standardized Balance Assessment Five Times Sit to Stand;Timed Up and Go Test   Timed Up and Go Test   TUG Normal TUG   Normal TUG (seconds) 16   TUG Comments high risk of falls                           PT Education - 05/29/15 1304    Education provided Yes   Education Details hamstring stretch, LAQ, sit to stand   Person(s) Educated Patient   Methods Explanation;Demonstration;Verbal cues;Handout   Comprehension Returned demonstration;Verbalized understanding          PT Short Term Goals - 05/29/15 1318    PT SHORT TERM GOAL #1   Title indpendent with initial HEP   Time 4   Period Weeks   Status New   PT SHORT TERM GOAL #2   Title knee pain with stairs decreased by 25%   Time 4   Period  Weeks   Status New   PT SHORT TERM GOAL #3   Title timed up  and go test </= 15 sec no assistive device   Time 4   Period Weeks   Status New   PT SHORT TERM GOAL #4   Title sit to stand </= 25 sec   Time 4   Period Weeks   Status New           PT Long Term Goals - June 07, 2015 1319    PT LONG TERM GOAL #1   Title indpendent with HEP   Time 8   Period Weeks   Status New   PT LONG TERM GOAL #2   Title timed up and go </= 13 sec   Time 8   Period Weeks   Status New   PT LONG TERM GOAL #3   Title sit to stand </15 sec   Time 8   Period Weeks   Status New   PT LONG TERM GOAL #4   Title go up and down steps with step over step and pain decreased >/= 50%   Time 8   Period Weeks   Status New   PT LONG TERM GOAL #5   Title left knee strength 4+/5 so she is able to ambulate with no assistive device in the community   Time 4   Period Weeks   Status New   Additional Long Term Goals   Additional Long Term Goals Yes   PT LONG TERM GOAL #6   Title left knee flexion >/= 105 degrees AROM so she is able to get up and down from the floor   Time 8   Period Weeks   Status New               Plan - 2015-06-07 1309    Clinical Impression Statement Patient is a 82 year old female with diagnosis of gait abnormality.  Patient reports pain in left knee at level 5/10 with laying in bed and stairs.  Patient has a quad cane but does not use it.  Patient fell 5 weeks ago when outside to  to feed her birds.  Left hip strength is 4/5, Left knee strength is 3+/5 for extension and 4/5 for flexion.  Left knee AROM/PROM  in degrees: flexion 90/100 and extension -8/-20.  Patient timed up and go test was 16 sec and sit to stand for 31 seconds indicating high risk of falls.  Patient walks slowlly and does steps with step to step pattern. Patient will benefit from physical therapy to  increase strength and  mobility.    Pt will benefit from skilled therapeutic intervention in order to improve on the  following deficits Abnormal gait;Decreased mobility;Hypomobility;Decreased strength;Decreased range of motion;Decreased endurance;Decreased activity tolerance   Rehab Potential Excellent   Clinical Impairments Affecting Rehab Potential None   PT Frequency 2x / week   PT Duration 8 weeks   PT Treatment/Interventions Electrical Stimulation;Cryotherapy;Moist Heat;Gait training;Stair training;Functional mobility training;Therapeutic activities;Therapeutic exercise;Balance training;Patient/family education;Neuromuscular re-education;Manual techniques;Passive range of motion   PT Next Visit Plan Nustep, balance exercises, hip and knee strenth, knee ROM exercises, gait training   PT Home Exercise Plan flexibility exericses   Recommended Other Services None   Consulted and Agree with Plan of Care Patient          G-Codes - 2015/06/07 1322    Functional Assessment Tool Used FOTO score is 72% limitaiton  goal is 56% limitation   Functional Limitation Mobility: Walking and moving around   Mobility: Walking and Moving Around Current Status [id number]) At least 60 percent  but less than 80 percent impaired, limited or restricted   Mobility: Walking and Moving Around Goal Status [phone]) At least 40 percent but less than 60 percent impaired, limited or restricted       Problem List Patient Active Problem List   Diagnosis Date Noted  . Acute blood loss anemia 04/02/2014  . Colitis 04/01/2014  . Diastolic dysfunction with chronic heart failure ([place]) 04/01/2014  . GERD (gastroesophageal reflux disease) 05/02/2013  . Diverticulosis of large intestine 05/02/2013  . OA (osteoarthritis) of knee 05/01/2013  . Arm edema - right 12/29/2012  . Bilateral leg edema 12/29/2012    Class: History of  . Essential hypertension   . Dyslipidemia, goal LDL below 100     Earlie Counts, PT 05/29/2015 1:25 PM   [facility] [address] [place],  [place], [address] Phone: [phone]   Fax:  [phone]  Name: [name] Morton MRN: [id number] Date of Birth: 01/25/1927

## 2015-06-04 ENCOUNTER — Ambulatory Visit: Payer: Medicare Other | Attending: Family Medicine

## 2015-06-04 DIAGNOSIS — M79605 Pain in left leg: Secondary | ICD-10-CM | POA: Diagnosis not present

## 2015-06-04 DIAGNOSIS — M25662 Stiffness of left knee, not elsewhere classified: Secondary | ICD-10-CM | POA: Diagnosis not present

## 2015-06-04 DIAGNOSIS — R2689 Other abnormalities of gait and mobility: Secondary | ICD-10-CM | POA: Diagnosis not present

## 2015-06-04 NOTE — Therapy (Signed)
[facility] [address] [place], [place], [address] Phone: [phone]   Fax:  [phone]  Physical Therapy Treatment  Patient Details  Name: [name] Morton MRN: [id number] Date of Birth: 09/23/1926 Referring Provider: Dr. Butch Penny GAtes  Encounter Date: 06/04/2015      PT End of Session - 06/04/15 0816    Visit Number 2   Number of Visits 10   Date for PT Re-Evaluation 07/24/15   Authorization Type [employer]; g-code on 10th visit, Kx modifier for 15 th   PT Start Time 0728   PT Stop Time 0809   PT Time Calculation (min) 41 min   Activity Tolerance Patient tolerated treatment well   Behavior During Therapy [facility] for tasks assessed/performed      Past Medical History  Diagnosis Date  . History of nuclear stress test 11/2009    dipyridamole; no evidence of ischemia, normal, low risk   . Dyslipidemia   . Breast cancer ([place]) 1997    right - tx with lumpectomty & radiation  . Hearing impairment     BILATERAL HEARING AIDS  . History of shingles 1998    NO RESIDUAL PROBLEMS  . Sinus problem   . Shortness of breath     WHEN CLIMBING STEPS AT MY HOME--OTHERWISE OK  . GERD (gastroesophageal reflux disease)     RARE - NO MEDICATIONS  . Lymphedema of arm     RIGHT   . OA (osteoarthritis)     OA AND PAIN LEFT KNEE  . Diverticulosis   . Sleep difficulties     ALWAYS HAD SLEEPING PROBLEMS - MEDICATION HELPS PT SLEEP ABOUT 3 HOURS  . Hypertension     SEES CARDIOLOGIST DR. DAVID HARDING FOR HYPERTENSION     Past Surgical History  Procedure Laterality Date  . Replacement total knee  2002  . Rotator cuff repair Right 12/2009    Dr. Lenna Sciara. [place]   . Appendectomy  1951  . Abdominal hysterectomy  1973  . Transthoracic echocardiogram  05/1994    trace mitral/tricuspic/aortic inusff; hyperkinetic LV  . Eye surgery      BILATERAL CATARACT EXTRACTIONS AND LENS IMPLANTS  . Breast lumpectomy Right 1997  . Breast adenoma   1950    EXCISED  . Total knee arthroplasty Left 05/01/2013    Procedure: LEFT TOTAL KNEE ARTHROPLASTY;  Surgeon: Gearlean Alf, MD;  Location: [facility];  Service: Orthopedics;  Laterality: Left;    There were no vitals filed for this visit.  Visit Diagnosis:  Other abnormalities of gait and mobility  Pain In Left Leg  Other stiff joint, of the lower leg, left      Subjective Assessment - 06/04/15 0735    Subjective Pt lost her HEP handout so hasnt been doing exercises.  No change in symptoms.     Patient Stated Goals be able to get up from a floor   Currently in Pain? Yes   Pain Score 3    Pain Location Knee   Pain Orientation Left   Pain Descriptors / Indicators Sore   Pain Type Chronic pain   Pain Onset More than a month ago   Pain Frequency Intermittent   Aggravating Factors  working outside, laying down, stairs   Pain Relieving Factors rest                         [facility] Adult PT Treatment/Exercise - 06/04/15 0001  Exercises   Exercises Knee/Hip   Knee/Hip Exercises: Stretches   Active Hamstring Stretch 3 reps;20 seconds;Both   Knee/Hip Exercises: Aerobic   Nustep Level 1x  6 minutes  seat 4, arms 9   Knee/Hip Exercises: Standing   Hip Abduction Stengthening;Both;2 sets;10 reps   Hip Extension Stengthening;2 sets;10 reps   Rocker Board 3 minutes   Rebounder weight shifting 3 ways x 1 minute each   Knee/Hip Exercises: Seated   Long Arc Quad Both;2 sets;10 reps   Sit to [employer] 20 reps;without UE support                  PT Short Term Goals - 06/04/15 0734    PT SHORT TERM GOAL #1   Title indpendent with initial HEP   Time 4   Period Weeks   Status On-going  pt lost her HEP handout   PT SHORT TERM GOAL #2   Title knee pain with stairs decreased by 25%   Time 4   Period Weeks   Status On-going           PT Long Term Goals - 05/29/15 1319    PT LONG TERM GOAL #1   Title indpendent with HEP   Time 8   Period Weeks    Status New   PT LONG TERM GOAL #2   Title timed up and go </= 13 sec   Time 8   Period Weeks   Status New   PT LONG TERM GOAL #3   Title sit to stand </15 sec   Time 8   Period Weeks   Status New   PT LONG TERM GOAL #4   Title go up and down steps with step over step and pain decreased >/= 50%   Time 8   Period Weeks   Status New   PT LONG TERM GOAL #5   Title left knee strength 4+/5 so she is able to ambulate with no assistive device in the community   Time 4   Period Weeks   Status New   Additional Long Term Goals   Additional Long Term Goals Yes   PT LONG TERM GOAL #6   Title left knee flexion >/= 105 degrees AROM so she is able to get up and down from the floor   Time 8   Period Weeks   Status New               Plan - 06/04/15 [id number]    Clinical Impression Statement Pt with only 1 session after evaluation.  She has not been performing HEP due to losing her handout. PT reviewed HEP with pt today and she performed correctly.  Pt with continued Lt knee pain especially with steps and increased activity.  Pt with Lt hip and knee weakness and reduced time with TUG and 5x sit to standing indicating high risk for falls.  Pt will benefit form skilled PT for increased strength and mobility to reduce falls risk.     Pt will benefit from skilled therapeutic intervention in order to improve on the following deficits Abnormal gait;Decreased mobility;Hypomobility;Decreased strength;Decreased range of motion;Decreased endurance;Decreased activity tolerance   Rehab Potential Excellent   PT Frequency 2x / week   PT Duration 8 weeks   PT Treatment/Interventions Electrical Stimulation;Cryotherapy;Moist Heat;Gait training;Stair training;Functional mobility training;Therapeutic activities;Therapeutic exercise;Balance training;Patient/family education;Neuromuscular re-education;Manual techniques;Passive range of motion   PT Next Visit Plan Nustep, balance exercises, hip and knee strenth, knee  ROM exercises, gait training  Consulted and Agree with Plan of Care Patient        Problem List Patient Active Problem List   Diagnosis Date Noted  . Acute blood loss anemia 04/02/2014  . Colitis 04/01/2014  . Diastolic dysfunction with chronic heart failure ([place]) 04/01/2014  . GERD (gastroesophageal reflux disease) 05/02/2013  . Diverticulosis of large intestine 05/02/2013  . OA (osteoarthritis) of knee 05/01/2013  . Arm edema - right 12/29/2012  . Bilateral leg edema 12/29/2012    Class: History of  . Essential hypertension   . Dyslipidemia, goal LDL below 100     Sigurd Sos, PT 06/04/2015 8:20 AM  [facility] [address] [place], [place], [address] Phone: [phone]   Fax:  [phone]  Name: [name] Morton MRN: [id number] Date of Birth: 07-15-1926

## 2015-06-10 DIAGNOSIS — Z0100 Encounter for examination of eyes and vision without abnormal findings: Secondary | ICD-10-CM | POA: Diagnosis not present

## 2015-06-10 DIAGNOSIS — H43813 Vitreous degeneration, bilateral: Secondary | ICD-10-CM | POA: Diagnosis not present

## 2015-06-10 DIAGNOSIS — H04123 Dry eye syndrome of bilateral lacrimal glands: Secondary | ICD-10-CM | POA: Diagnosis not present

## 2015-06-10 DIAGNOSIS — Z961 Presence of intraocular lens: Secondary | ICD-10-CM | POA: Diagnosis not present

## 2015-06-11 ENCOUNTER — Ambulatory Visit: Payer: Medicare Other | Admitting: Physical Therapy

## 2015-06-11 ENCOUNTER — Encounter: Payer: Self-pay | Admitting: Physical Therapy

## 2015-06-11 DIAGNOSIS — M79605 Pain in left leg: Secondary | ICD-10-CM | POA: Diagnosis not present

## 2015-06-11 DIAGNOSIS — R2689 Other abnormalities of gait and mobility: Secondary | ICD-10-CM | POA: Diagnosis not present

## 2015-06-11 DIAGNOSIS — M25662 Stiffness of left knee, not elsewhere classified: Secondary | ICD-10-CM | POA: Diagnosis not present

## 2015-06-11 NOTE — Therapy (Signed)
[facility] [address], [place], [place], [address] Phone: [phone]   Fax:  [phone]  Physical Therapy Treatment  Patient Details  Name: [name] Morton MRN: [id number] Date of Birth: 1927/03/01 Referring Provider: Dr. Butch Penny GAtes  Encounter Date: 06/11/2015      PT End of Session - 06/11/15 0939    Visit Number 3   Number of Visits 10  Medicare   Date for PT Re-Evaluation 07/24/15   Authorization Type [employer]; g-code on 10th visit, Kx modifier for 15 th   PT Start Time 0930   PT Stop Time 1010   PT Time Calculation (min) 40 min   Activity Tolerance Patient tolerated treatment well   Behavior During Therapy [facility] for tasks assessed/performed      Past Medical History  Diagnosis Date  . History of nuclear stress test 11/2009    dipyridamole; no evidence of ischemia, normal, low risk   . Dyslipidemia   . Breast cancer ([place]) 1997    right - tx with lumpectomty & radiation  . Hearing impairment     BILATERAL HEARING AIDS  . History of shingles 1998    NO RESIDUAL PROBLEMS  . Sinus problem   . Shortness of breath     WHEN CLIMBING STEPS AT MY HOME--OTHERWISE OK  . GERD (gastroesophageal reflux disease)     RARE - NO MEDICATIONS  . Lymphedema of arm     RIGHT   . OA (osteoarthritis)     OA AND PAIN LEFT KNEE  . Diverticulosis   . Sleep difficulties     ALWAYS HAD SLEEPING PROBLEMS - MEDICATION HELPS PT SLEEP ABOUT 3 HOURS  . Hypertension     SEES CARDIOLOGIST DR. DAVID HARDING FOR HYPERTENSION     Past Surgical History  Procedure Laterality Date  . Replacement total knee  2002  . Rotator cuff repair Right 12/2009    Dr. Lenna Sciara. [place]   . Appendectomy  1951  . Abdominal hysterectomy  1973  . Transthoracic echocardiogram  05/1994    trace mitral/tricuspic/aortic inusff; hyperkinetic LV  . Eye surgery      BILATERAL CATARACT EXTRACTIONS AND LENS IMPLANTS  . Breast lumpectomy Right 1997  .  Breast adenoma  1950    EXCISED  . Total knee arthroplasty Left 05/01/2013    Procedure: LEFT TOTAL KNEE ARTHROPLASTY;  Surgeon: Gearlean Alf, MD;  Location: [facility];  Service: Orthopedics;  Laterality: Left;    There were no vitals filed for this visit.      Subjective Assessment - 06/11/15 0947    Subjective I do not do stairs like a normal person. I have been doing my exercises. LEft knee pain has decreased by 75%.    Patient Stated Goals be able to get up from a floor   Currently in Pain? No/denies            [facility] PT Assessment - 06/11/15 0001    AROM   Right/Left Knee Left   Left Knee Extension -2  sitting   Left Knee Flexion 100  sitting   Standardized Balance Assessment   Standardized Balance Assessment Five Times Sit to Stand   Five times sit to stand comments  13 sec  12 sec, 10 sec   Timed Up and Go Test   TUG Normal TUG   Normal TUG (seconds) 13  9 sec, 8 sec   TUG Comments very little chance of falling  [place] Adult PT Treatment/Exercise - 06/11/15 0001    Ambulation/Gait   Stairs Yes  able to go up and down alternating steps 8 times   Stairs Assistance 7: Independent   Stair Management Technique Two rails   Knee/Hip Exercises: Stretches   Active Hamstring Stretch 3 reps;20 seconds;Both   Knee/Hip Exercises: Aerobic   Nustep Level 1x  6 minutes  seat 4, arms 9   Knee/Hip Exercises: Seated   Long Arc Quad Strengthening;Left;20 reps;Weights  2 sets   Long Arc Quad Weight 3 lbs.                  PT Short Term Goals - 06/11/15 0946    PT SHORT TERM GOAL #1   Title indpendent with initial HEP   Time 4   Period Weeks   Status Achieved   PT SHORT TERM GOAL #2   Title knee pain with stairs decreased by 25%   Time 4   Period Weeks   Status Achieved   PT SHORT TERM GOAL #3   Title timed up and go test </= 15 sec no assistive device   Time 4   Period Weeks   Status Achieved  13 sec   PT SHORT TERM GOAL  #4   Title sit to stand </= 25 sec   Time 4   Period Weeks   Status Achieved  13 sec           PT Long Term Goals - 06/11/15 0956    PT LONG TERM GOAL #1   Title indpendent with HEP   Time 8   Period Weeks   Status On-going   PT LONG TERM GOAL #2   Title timed up and go </= 13 sec   Time 8   Period Weeks   Status Achieved   PT LONG TERM GOAL #3   Title sit to stand </15 sec   Time 8   Period Weeks   Status Achieved  13 sec               Plan - 06/11/15 1006    Clinical Impression Statement Ptient TUG score improved to 13 sec and sit to stand decreased to 13 sec.  Patient can go up and down stairs with step over step pattern with greater ease.  Patient has increased left knee AROM.  Patient has met all of her STG's.  Patient has met LTG # 2 and 3. Patient will benefit form physical therapy to improve balance and strength.    Rehab Potential Excellent   Clinical Impairments Affecting Rehab Potential None   PT Frequency 2x / week   PT Duration 8 weeks   PT Treatment/Interventions Electrical Stimulation;Cryotherapy;Moist Heat;Gait training;Stair training;Functional mobility training;Therapeutic activities;Therapeutic exercise;Balance training;Patient/family education;Neuromuscular re-education;Manual techniques;Passive range of motion   PT Next Visit Plan Nustep, balance exercises, hip and knee strenth, knee ROM exercises, gait training; stair training   PT Home Exercise Plan update HEP   Consulted and Agree with Plan of Care Patient      Patient will benefit from skilled therapeutic intervention in order to improve the following deficits and impairments:  Abnormal gait, Decreased mobility, Hypomobility, Decreased strength, Decreased range of motion, Decreased endurance, Decreased activity tolerance  Visit Diagnosis: Other abnormalities of gait and mobility  Pain In Left Leg     Problem List Patient Active Problem List   Diagnosis Date Noted  . Acute blood  loss anemia 04/02/2014  . Colitis 04/01/2014  . Diastolic  dysfunction with chronic heart failure ([place]) 04/01/2014  . GERD (gastroesophageal reflux disease) 05/02/2013  . Diverticulosis of large intestine 05/02/2013  . OA (osteoarthritis) of knee 05/01/2013  . Arm edema - right 12/29/2012  . Bilateral leg edema 12/29/2012    Class: History of  . Essential hypertension   . Dyslipidemia, goal LDL below 100     Earlie Counts, PT 06/11/2015 10:16 AM   [facility] [address], [place], [place], [address] Phone: [phone]   Fax:  [phone]  Name: [name] Morton MRN: [id number] Date of Birth: Dec 21, 1926

## 2015-06-13 ENCOUNTER — Encounter: Payer: Self-pay | Admitting: Physical Therapy

## 2015-06-13 ENCOUNTER — Ambulatory Visit: Payer: Medicare Other | Admitting: Physical Therapy

## 2015-06-13 DIAGNOSIS — M25662 Stiffness of left knee, not elsewhere classified: Secondary | ICD-10-CM | POA: Diagnosis not present

## 2015-06-13 DIAGNOSIS — R2689 Other abnormalities of gait and mobility: Secondary | ICD-10-CM

## 2015-06-13 DIAGNOSIS — M79605 Pain in left leg: Secondary | ICD-10-CM | POA: Diagnosis not present

## 2015-06-13 NOTE — Therapy (Signed)
[facility] [address], [place], [place], [address] Phone: [phone]   Fax:  [phone]  Physical Therapy Treatment  Patient Details  Name: [name] Morton MRN: [id number] Date of Birth: April 23, 1926 Referring Provider: Dr. Butch Penny GAtes  Encounter Date: 06/13/2015      PT End of Session - 06/13/15 1003    Visit Number 4   Number of Visits 10  Medicare   Date for PT Re-Evaluation 07/24/15   Authorization Type [employer]; g-code on 10th visit, Kx modifier for 15 th   PT Start Time 0930   PT Stop Time 1015   PT Time Calculation (min) 45 min   Activity Tolerance Patient limited by pain  back pain   Behavior During Therapy [facility] for tasks assessed/performed      Past Medical History  Diagnosis Date  . History of nuclear stress test 11/2009    dipyridamole; no evidence of ischemia, normal, low risk   . Dyslipidemia   . Breast cancer ([place]) 1997    right - tx with lumpectomty & radiation  . Hearing impairment     BILATERAL HEARING AIDS  . History of shingles 1998    NO RESIDUAL PROBLEMS  . Sinus problem   . Shortness of breath     WHEN CLIMBING STEPS AT MY HOME--OTHERWISE OK  . GERD (gastroesophageal reflux disease)     RARE - NO MEDICATIONS  . Lymphedema of arm     RIGHT   . OA (osteoarthritis)     OA AND PAIN LEFT KNEE  . Diverticulosis   . Sleep difficulties     ALWAYS HAD SLEEPING PROBLEMS - MEDICATION HELPS PT SLEEP ABOUT 3 HOURS  . Hypertension     SEES CARDIOLOGIST DR. DAVID HARDING FOR HYPERTENSION     Past Surgical History  Procedure Laterality Date  . Replacement total knee  2002  . Rotator cuff repair Right 12/2009    Dr. Lenna Sciara. [place]   . Appendectomy  1951  . Abdominal hysterectomy  1973  . Transthoracic echocardiogram  05/1994    trace mitral/tricuspic/aortic inusff; hyperkinetic LV  . Eye surgery      BILATERAL CATARACT EXTRACTIONS AND LENS IMPLANTS  . Breast lumpectomy Right 1997  .  Breast adenoma  1950    EXCISED  . Total knee arthroplasty Left 05/01/2013    Procedure: LEFT TOTAL KNEE ARTHROPLASTY;  Surgeon: Gearlean Alf, MD;  Location: [facility];  Service: Orthopedics;  Laterality: Left;    There were no vitals filed for this visit.      Subjective Assessment - 06/13/15 0939    Subjective I am not feeling  good.  My back is hurting for 2 days.    Patient Stated Goals be able to get up from a floor   Currently in Pain? Yes   Pain Score 8    Pain Location Back   Pain Orientation Lower   Pain Type Acute pain   Pain Onset In the past 7 days   Pain Frequency Constant   Aggravating Factors  moving   Pain Relieving Factors rest   Multiple Pain Sites No                         [facility] Adult PT Treatment/Exercise - 06/13/15 0001    Knee/Hip Exercises: Stretches   Active Hamstring Stretch Left;Right;30 seconds;2 reps  lay on hot pack   Gastroc Stretch Left;Right;30 seconds  done by therapist;  lay on hot pack   Other Knee/Hip Stretches knee to chest and piriformis stretch bil. by therapist  lay on hot pack   Knee/Hip Exercises: Aerobic   Nustep Level 1x  6 minutes  seat 4, arms 9   Knee/Hip Exercises: Seated   Long Arc Quad Strengthening;Left;20 reps;Weights  2 sets   Long Arc Quad Weight 3 lbs.   Knee/Hip Flexion 10 times each leg sitting   Other Seated Knee/Hip Exercises within limits of back pain   Marching Weights 3 lbs.   Modalities   Modalities Moist Heat   Moist Heat Therapy   Number Minutes Moist Heat 15 Minutes   Moist Heat Location Lumbar Spine                PT Education - 06/13/15 1003    Education provided No          PT Short Term Goals - 06/11/15 0946    PT SHORT TERM GOAL #1   Title indpendent with initial HEP   Time 4   Period Weeks   Status Achieved   PT SHORT TERM GOAL #2   Title knee pain with stairs decreased by 25%   Time 4   Period Weeks   Status Achieved   PT SHORT TERM GOAL #3   Title  timed up and go test </= 15 sec no assistive device   Time 4   Period Weeks   Status Achieved  13 sec   PT SHORT TERM GOAL #4   Title sit to stand </= 25 sec   Time 4   Period Weeks   Status Achieved  13 sec           PT Long Term Goals - 06/11/15 0956    PT LONG TERM GOAL #1   Title indpendent with HEP   Time 8   Period Weeks   Status On-going   PT LONG TERM GOAL #2   Title timed up and go </= 13 sec   Time 8   Period Weeks   Status Achieved   PT LONG TERM GOAL #3   Title sit to stand </15 sec   Time 8   Period Weeks   Status Achieved  13 sec               Plan - 06/13/15 1003    Clinical Impression Statement Patient was limited today with exercise due to back pain being 8/10.  Patient had to lay on hot pack while being stretched and after exercise due to back pain.  Patient will benefit from physical therapy to  imporve strength and mobility .    Rehab Potential Excellent   Clinical Impairments Affecting Rehab Potential None   PT Frequency 2x / week   PT Duration 8 weeks   PT Treatment/Interventions Electrical Stimulation;Cryotherapy;Moist Heat;Gait training;Stair training;Functional mobility training;Therapeutic activities;Therapeutic exercise;Balance training;Patient/family education;Neuromuscular re-education;Manual techniques;Passive range of motion   PT Next Visit Plan Nustep, balance exercises, hip and knee strenth, knee ROM exercises, gait training; stair training resume if back feel better   PT Home Exercise Plan update HEP   Consulted and Agree with Plan of Care Patient      Patient will benefit from skilled therapeutic intervention in order to improve the following deficits and impairments:  Abnormal gait, Decreased mobility, Hypomobility, Decreased strength, Decreased range of motion, Decreased endurance, Decreased activity tolerance  Visit Diagnosis: Other abnormalities of gait and mobility     Problem List Patient Active Problem  List    Diagnosis Date Noted  . Acute blood loss anemia 04/02/2014  . Colitis 04/01/2014  . Diastolic dysfunction with chronic heart failure ([place]) 04/01/2014  . GERD (gastroesophageal reflux disease) 05/02/2013  . Diverticulosis of large intestine 05/02/2013  . OA (osteoarthritis) of knee 05/01/2013  . Arm edema - right 12/29/2012  . Bilateral leg edema 12/29/2012    Class: History of  . Essential hypertension   . Dyslipidemia, goal LDL below 100    Earlie Counts, PT 06/13/2015 10:06 AM    [facility] [address], [place], [place], [address] Phone: [phone]   Fax:  [phone]  Name: [name] Morton MRN: [id number] Date of Birth: 11-28-1926

## 2015-06-18 ENCOUNTER — Ambulatory Visit: Payer: Medicare Other

## 2015-06-18 DIAGNOSIS — M79605 Pain in left leg: Secondary | ICD-10-CM | POA: Diagnosis not present

## 2015-06-18 DIAGNOSIS — R2689 Other abnormalities of gait and mobility: Secondary | ICD-10-CM | POA: Diagnosis not present

## 2015-06-18 DIAGNOSIS — M25662 Stiffness of left knee, not elsewhere classified: Secondary | ICD-10-CM | POA: Diagnosis not present

## 2015-06-18 NOTE — Therapy (Signed)
[facility] [address] [place], [place], [address] Phone: [phone]   Fax:  [phone]  Physical Therapy Treatment  Patient Details  Name: [name] Morton MRN: [id number] Date of Birth: [birth year] Referring Provider: Dr. Butch Penny GAtes  Encounter Date: 06/18/2015      PT End of Session - 06/18/15 1005    Visit Number 5   Number of Visits 10   Date for PT Re-Evaluation 07/24/15   Authorization Type [employer]; g-code on 10th visit, Kx modifier for 15 th   PT Start Time 0923   PT Stop Time 1021   PT Time Calculation (min) 58 min   Activity Tolerance Patient tolerated treatment well   Behavior During Therapy [facility] for tasks assessed/performed      Past Medical History  Diagnosis Date  . History of nuclear stress test 11/2009    dipyridamole; no evidence of ischemia, normal, low risk   . Dyslipidemia   . Breast cancer ([place]) 1997    right - tx with lumpectomty & radiation  . Hearing impairment     BILATERAL HEARING AIDS  . History of shingles 1998    NO RESIDUAL PROBLEMS  . Sinus problem   . Shortness of breath     WHEN CLIMBING STEPS AT MY HOME--OTHERWISE OK  . GERD (gastroesophageal reflux disease)     RARE - NO MEDICATIONS  . Lymphedema of arm     RIGHT   . OA (osteoarthritis)     OA AND PAIN LEFT KNEE  . Diverticulosis   . Sleep difficulties     ALWAYS HAD SLEEPING PROBLEMS - MEDICATION HELPS PT SLEEP ABOUT 3 HOURS  . Hypertension     SEES CARDIOLOGIST DR. DAVID HARDING FOR HYPERTENSION     Past Surgical History  Procedure Laterality Date  . Replacement total knee  2002  . Rotator cuff repair Right 12/2009    Dr. Lenna Sciara. [place]   . Appendectomy  1951  . Abdominal hysterectomy  1973  . Transthoracic echocardiogram  05/1994    trace mitral/tricuspic/aortic inusff; hyperkinetic LV  . Eye surgery      BILATERAL CATARACT EXTRACTIONS AND LENS IMPLANTS  . Breast lumpectomy Right 1997  . Breast  adenoma  1950    EXCISED  . Total knee arthroplasty Left 05/01/2013    Procedure: LEFT TOTAL KNEE ARTHROPLASTY;  Surgeon: Gearlean Alf, MD;  Location: [facility];  Service: Orthopedics;  Laterality: Left;    There were no vitals filed for this visit.      Subjective Assessment - 06/18/15 0928    Subjective My back is still hurting.  Feeling a little better today.   Patient Stated Goals be able to get up from a floor   Currently in Pain? Yes   Pain Score 6    Pain Location Back   Pain Orientation Lower;Right;Left   Pain Descriptors / Indicators Sore   Pain Type Acute pain   Pain Onset In the past 7 days   Pain Frequency Constant   Aggravating Factors  moving   Pain Relieving Factors rest            [facility] PT Assessment - 06/18/15 0001    AROM   Right/Left Knee Left   Left Knee Flexion 110  in supine   Strength   Right/Left Knee Left   Left Knee Flexion 4+/5   Left Knee Extension 4/5  [facility] Adult PT Treatment/Exercise - 06/18/15 0001    Knee/Hip Exercises: Stretches   Active Hamstring Stretch Left;Right;30 seconds;2 reps  sitting    Knee/Hip Exercises: Aerobic   Nustep Level 2 x  8 minutes   Knee/Hip Exercises: Standing   Rocker Board 3 minutes   Rebounder weight shifting 3 ways x 1 minute each   Knee/Hip Exercises: Seated   Long Arc Quad Strengthening;Left;20 reps;Weights  2 sets   Long Arc Quad Weight 3 lbs.   Knee/Hip Flexion 2x10   Marching Weights 3 lbs.   Sit to Sand 20 reps;without UE support   Knee/Hip Exercises: Supine   Straight Leg Raises Right;Strengthening;10 reps  LBP with this so stopped early   Modalities   Modalities Moist Heat   Moist Heat Therapy   Number Minutes Moist Heat 15 Minutes   Moist Heat Location Lumbar Spine                  PT Short Term Goals - 06/11/15 0946    PT SHORT TERM GOAL #1   Title indpendent with initial HEP   Time 4   Period Weeks   Status Achieved   PT SHORT TERM  GOAL #2   Title knee pain with stairs decreased by 25%   Time 4   Period Weeks   Status Achieved   PT SHORT TERM GOAL #3   Title timed up and go test </= 15 sec no assistive device   Time 4   Period Weeks   Status Achieved  13 sec   PT SHORT TERM GOAL #4   Title sit to stand </= 25 sec   Time 4   Period Weeks   Status Achieved  13 sec           PT Long Term Goals - 06/18/15 0932    PT LONG TERM GOAL #1   Title indpendent with HEP   Time 8   Period Weeks   Status On-going   PT LONG TERM GOAL #4   Title go up and down steps with step over step and pain decreased >/= 50%   Time 8   Period Weeks   Status On-going  pain and difficulty with descending   PT LONG TERM GOAL #5   Title left knee strength 4+/5 so she is able to ambulate with no assistive device in the community   Time 8   Period Weeks   Status On-going   PT LONG TERM GOAL #6   Title left knee flexion >/= 105 degrees AROM so she is able to get up and down from the floor   Status Achieved               Plan - 06/18/15 0934    Clinical Impression Statement Pt with continued LBP this week that is limiting her ability to perform standing activity.  Pt with improved mobility since the start of care with TUG test and 5x sit to stand tested last week.  Pt with improved Lt knee AROM to 110 degrees and strength is improved.  Pt still not able to get up from the floor and reports difficulty with descending steps.  Pt will continue to benefit from skilled PT for LE strength, endurance and functional mobility.     Rehab Potential Excellent   PT Frequency 2x / week   PT Duration 8 weeks   PT Treatment/Interventions Electrical Stimulation;Cryotherapy;Moist Heat;Gait training;Stair training;Functional mobility training;Therapeutic activities;Therapeutic exercise;Balance training;Patient/family education;Neuromuscular re-education;Manual techniques;Passive range  of motion   PT Next Visit Plan Nustep, balance exercises,  hip and knee strenth, knee ROM exercises, functional training as able due to LBP   Consulted and Agree with Plan of Care Patient      Patient will benefit from skilled therapeutic intervention in order to improve the following deficits and impairments:  Abnormal gait, Decreased mobility, Hypomobility, Decreased strength, Decreased range of motion, Decreased endurance, Decreased activity tolerance  Visit Diagnosis: Other abnormalities of gait and mobility  Pain In Left Leg     Problem List Patient Active Problem List   Diagnosis Date Noted  . Acute blood loss anemia 04/02/2014  . Colitis 04/01/2014  . Diastolic dysfunction with chronic heart failure ([place]) 04/01/2014  . GERD (gastroesophageal reflux disease) 05/02/2013  . Diverticulosis of large intestine 05/02/2013  . OA (osteoarthritis) of knee 05/01/2013  . Arm edema - right 12/29/2012  . Bilateral leg edema 12/29/2012    Class: History of  . Essential hypertension   . Dyslipidemia, goal LDL below 100     Sigurd Sos, PT 06/18/2015 10:06 AM  [facility] [address] [place], [place], [address] Phone: [phone]   Fax:  [phone]  Name: [name] Morton MRN: [id number] Date of Birth: 07/11/1926

## 2015-06-20 ENCOUNTER — Ambulatory Visit: Payer: Medicare Other

## 2015-06-20 DIAGNOSIS — R2689 Other abnormalities of gait and mobility: Secondary | ICD-10-CM | POA: Diagnosis not present

## 2015-06-20 DIAGNOSIS — M79605 Pain in left leg: Secondary | ICD-10-CM

## 2015-06-20 DIAGNOSIS — M25662 Stiffness of left knee, not elsewhere classified: Secondary | ICD-10-CM | POA: Diagnosis not present

## 2015-06-20 NOTE — Therapy (Signed)
[facility] [address] [place], [place], [address] Phone: [phone]   Fax:  [phone]  Physical Therapy Treatment  Patient Details  Name: [name] Morton MRN: [id number] Date of Birth: August 29, 1926 Referring Provider: Dr. Butch Penny GAtes  Encounter Date: 06/20/2015      PT End of Session - 06/20/15 0958    Visit Number 6   Number of Visits 10   Date for PT Re-Evaluation 07/24/15   Authorization Type [employer]; g-code on 10th visit, Kx modifier for 15 th   PT Start Time 0921   PT Stop Time 1003   PT Time Calculation (min) 42 min   Activity Tolerance Patient tolerated treatment well   Behavior During Therapy [facility] for tasks assessed/performed      Past Medical History  Diagnosis Date  . History of nuclear stress test 11/2009    dipyridamole; no evidence of ischemia, normal, low risk   . Dyslipidemia   . Breast cancer ([place]) 1997    right - tx with lumpectomty & radiation  . Hearing impairment     BILATERAL HEARING AIDS  . History of shingles 1998    NO RESIDUAL PROBLEMS  . Sinus problem   . Shortness of breath     WHEN CLIMBING STEPS AT MY HOME--OTHERWISE OK  . GERD (gastroesophageal reflux disease)     RARE - NO MEDICATIONS  . Lymphedema of arm     RIGHT   . OA (osteoarthritis)     OA AND PAIN LEFT KNEE  . Diverticulosis   . Sleep difficulties     ALWAYS HAD SLEEPING PROBLEMS - MEDICATION HELPS PT SLEEP ABOUT 3 HOURS  . Hypertension     SEES CARDIOLOGIST DR. DAVID HARDING FOR HYPERTENSION     Past Surgical History  Procedure Laterality Date  . Replacement total knee  2002  . Rotator cuff repair Right 12/2009    Dr. Lenna Sciara. [place]   . Appendectomy  1951  . Abdominal hysterectomy  1973  . Transthoracic echocardiogram  05/1994    trace mitral/tricuspic/aortic inusff; hyperkinetic LV  . Eye surgery      BILATERAL CATARACT EXTRACTIONS AND LENS IMPLANTS  . Breast lumpectomy Right 1997  . Breast  adenoma  1950    EXCISED  . Total knee arthroplasty Left 05/01/2013    Procedure: LEFT TOTAL KNEE ARTHROPLASTY;  Surgeon: Gearlean Alf, MD;  Location: [facility];  Service: Orthopedics;  Laterality: Left;    There were no vitals filed for this visit.      Subjective Assessment - 06/20/15 0924    Subjective Feeling pretty good today.  Back still hurts-havent been putting a pain patch on it lately.    Patient Stated Goals be able to get up from a floor   Currently in Pain? Yes   Pain Score 5    Pain Location Back   Pain Orientation Right;Left;Lower   Pain Type Acute pain   Pain Onset 1 to 4 weeks ago   Pain Frequency Constant   Aggravating Factors  moving   Pain Relieving Factors rest                         [facility] Adult PT Treatment/Exercise - 06/20/15 0001    Knee/Hip Exercises: Stretches   Active Hamstring Stretch Left;Right;2 reps;20 seconds  sitting    Knee/Hip Exercises: Aerobic   Nustep Level 2 x  8 minutes  seat 5, arms 10  Knee/Hip Exercises: Standing   Hip Abduction Stengthening;Both;10 reps;3 sets   Abduction Limitations 3# added   Hip Extension Stengthening;10 reps;3 sets   Extension Limitations 3# added   Rocker Board 3 minutes   Rebounder weight shifting 3 ways x 1 minute each   Other Standing Knee Exercises alternating toe taps on 6" step.  3x20 reps without UE support   Knee/Hip Exercises: Seated   Long Arc Quad Strengthening;Left;20 reps;Weights  2 sets   Long Arc Quad Weight 3 lbs.   Sit to Sand 20 reps;without UE support   Modalities   Modalities --   Moist Heat Therapy   Number Minutes Moist Heat --   Moist Heat Location --                  PT Short Term Goals - 06/11/15 0946    PT SHORT TERM GOAL #1   Title indpendent with initial HEP   Time 4   Period Weeks   Status Achieved   PT SHORT TERM GOAL #2   Title knee pain with stairs decreased by 25%   Time 4   Period Weeks   Status Achieved   PT SHORT TERM GOAL #3    Title timed up and go test </= 15 sec no assistive device   Time 4   Period Weeks   Status Achieved  13 sec   PT SHORT TERM GOAL #4   Title sit to stand </= 25 sec   Time 4   Period Weeks   Status Achieved  13 sec           PT Long Term Goals - 06/18/15 0932    PT LONG TERM GOAL #1   Title indpendent with HEP   Time 8   Period Weeks   Status On-going   PT LONG TERM GOAL #4   Title go up and down steps with step over step and pain decreased >/= 50%   Time 8   Period Weeks   Status On-going  pain and difficulty with descending   PT LONG TERM GOAL #5   Title left knee strength 4+/5 so she is able to ambulate with no assistive device in the community   Time 8   Period Weeks   Status On-going   PT LONG TERM GOAL #6   Title left knee flexion >/= 105 degrees AROM so she is able to get up and down from the floor   Status Achieved               Plan - 06/20/15 0930    Clinical Impression Statement Pt with less LBP today with 5/10 reported.  Pt with improved mobility since the start of care. Lt knee AROM is 110 degrees flexion and strength is improved this week.  Pt able to tolerate activity in the clinic today without difficulty.  Pt will continue to benefit from skilled PT for LE strength, endurance and functional mobility.     Rehab Potential Excellent   PT Frequency 2x / week   PT Duration 8 weeks   PT Treatment/Interventions Electrical Stimulation;Cryotherapy;Moist Heat;Gait training;Stair training;Functional mobility training;Therapeutic activities;Therapeutic exercise;Balance training;Patient/family education;Neuromuscular re-education;Manual techniques;Passive range of motion   PT Next Visit Plan Nustep, balance exercises, hip and knee strenth, knee ROM exercises, functional training as able due to LBP   Consulted and Agree with Plan of Care Patient      Patient will benefit from skilled therapeutic intervention in order to improve the following  deficits and  impairments:  Abnormal gait, Decreased mobility, Hypomobility, Decreased strength, Decreased range of motion, Decreased endurance, Decreased activity tolerance  Visit Diagnosis: Other abnormalities of gait and mobility  Pain In Left Leg     Problem List Patient Active Problem List   Diagnosis Date Noted  . Acute blood loss anemia 04/02/2014  . Colitis 04/01/2014  . Diastolic dysfunction with chronic heart failure ([place]) 04/01/2014  . GERD (gastroesophageal reflux disease) 05/02/2013  . Diverticulosis of large intestine 05/02/2013  . OA (osteoarthritis) of knee 05/01/2013  . Arm edema - right 12/29/2012  . Bilateral leg edema 12/29/2012    Class: History of  . Essential hypertension   . Dyslipidemia, goal LDL below 100     Sigurd Sos, PT 06/20/2015 10:03 AM  [facility] [address] [place], [place], [address] Phone: [phone]   Fax:  [phone]  Name: [name] Morton MRN: [id number] Date of Birth: 1926/10/16

## 2015-06-25 ENCOUNTER — Ambulatory Visit: Payer: Medicare Other

## 2015-06-25 VITALS — HR 57

## 2015-06-25 DIAGNOSIS — M25662 Stiffness of left knee, not elsewhere classified: Secondary | ICD-10-CM

## 2015-06-25 DIAGNOSIS — R2689 Other abnormalities of gait and mobility: Secondary | ICD-10-CM

## 2015-06-25 DIAGNOSIS — M79605 Pain in left leg: Secondary | ICD-10-CM | POA: Diagnosis not present

## 2015-06-25 NOTE — Therapy (Signed)
[facility] [address] [place], [place], [address] Phone: [phone]   Fax:  [phone]  Physical Therapy Treatment  Patient Details  Name: [name] Morton MRN: [id number] Date of Birth: September 27, 1926 Referring Provider: Dr. Butch Penny GAtes  Encounter Date: 06/25/2015      PT End of Session - 06/25/15 1001    Visit Number 7   Number of Visits 10   Date for PT Re-Evaluation 07/24/15   Authorization Type [employer]; g-code on 10th visit, Kx modifier for 15 th   PT Start Time 0923   PT Stop Time 1005  session ended early due to feeling lightheaded.  10 minutes of this time was rest and monitoring   PT Time Calculation (min) 42 min   Activity Tolerance Treatment limited secondary to medical complications (Comment)   Behavior During Therapy [facility] for tasks assessed/performed      Past Medical History  Diagnosis Date  . History of nuclear stress test 11/2009    dipyridamole; no evidence of ischemia, normal, low risk   . Dyslipidemia   . Breast cancer ([place]) 1997    right - tx with lumpectomty & radiation  . Hearing impairment     BILATERAL HEARING AIDS  . History of shingles 1998    NO RESIDUAL PROBLEMS  . Sinus problem   . Shortness of breath     WHEN CLIMBING STEPS AT MY HOME--OTHERWISE OK  . GERD (gastroesophageal reflux disease)     RARE - NO MEDICATIONS  . Lymphedema of arm     RIGHT   . OA (osteoarthritis)     OA AND PAIN LEFT KNEE  . Diverticulosis   . Sleep difficulties     ALWAYS HAD SLEEPING PROBLEMS - MEDICATION HELPS PT SLEEP ABOUT 3 HOURS  . Hypertension     SEES CARDIOLOGIST DR. DAVID HARDING FOR HYPERTENSION     Past Surgical History  Procedure Laterality Date  . Replacement total knee  2002  . Rotator cuff repair Right 12/2009    Dr. Lenna Sciara. [place]   . Appendectomy  1951  . Abdominal hysterectomy  1973  . Transthoracic echocardiogram  05/1994    trace mitral/tricuspic/aortic inusff;  hyperkinetic LV  . Eye surgery      BILATERAL CATARACT EXTRACTIONS AND LENS IMPLANTS  . Breast lumpectomy Right 1997  . Breast adenoma  1950    EXCISED  . Total knee arthroplasty Left 05/01/2013    Procedure: LEFT TOTAL KNEE ARTHROPLASTY;  Surgeon: Gearlean Alf, MD;  Location: [facility];  Service: Orthopedics;  Laterality: Left;    Filed Vitals:   06/25/15 0942  Pulse: 57  SpO2: 93%        Subjective Assessment - 06/25/15 0942    Subjective Feeling tired today.  Allergies are bothing me.     Patient Stated Goals be able to get up from a floor   Currently in Pain? No/denies   Pain Score 0-No pain                         [facility] Adult PT Treatment/Exercise - 06/25/15 0001    Knee/Hip Exercises: Stretches   Active Hamstring Stretch Left;Right;2 reps;20 seconds  sitting    Knee/Hip Exercises: Aerobic   Nustep Level 2 x 10  minutes  seat 5, arms 10   Knee/Hip Exercises: Standing   Hip Abduction Stengthening;Both;10 reps;3 sets   Abduction Limitations 3# added   Hip Extension Stengthening;10 reps;3  sets   Extension Limitations 3# added   Knee/Hip Exercises: Seated   Long Arc Quad Strengthening;Left;20 reps;Weights  2 sets   Long Arc Quad Weight 3 lbs.   Marching Limitations 2x10 reps   Marching Weights 3 lbs.                  PT Short Term Goals - 06/11/15 0946    PT SHORT TERM GOAL #1   Title indpendent with initial HEP   Time 4   Period Weeks   Status Achieved   PT SHORT TERM GOAL #2   Title knee pain with stairs decreased by 25%   Time 4   Period Weeks   Status Achieved   PT SHORT TERM GOAL #3   Title timed up and go test </= 15 sec no assistive device   Time 4   Period Weeks   Status Achieved  13 sec   PT SHORT TERM GOAL #4   Title sit to stand </= 25 sec   Time 4   Period Weeks   Status Achieved  13 sec           PT Long Term Goals - 06/25/15 [id number]    PT LONG TERM GOAL #1   Title indpendent with HEP   Time 8   Status  On-going   PT LONG TERM GOAL #4   Title go up and down steps with step over step and pain decreased >/= 50%   Time 8   Period Weeks   Status On-going  getting better-~25%    PT LONG TERM GOAL #6   Title left knee flexion >/= 105 degrees AROM so she is able to get up and down from the floor   Status Achieved               Plan - 06/25/15 0944    Clinical Impression Statement Pt with fatigue today due to allergies.  Pt with improved endurance and is able to do NuStep for 10 minutes today.  Pt reports that she descends steps with step-over-step at times with the use of a rail.  Pt with 110 degrees of Lt knee flexion last session .Session ended early today due to patient feeling lightheaded.  Pt reports that this happens often with activity.  Pt will continue to benefit from skilled pt for LE strength, endurance and functional mobility.     Rehab Potential Excellent   PT Frequency 2x / week   PT Duration 8 weeks   PT Treatment/Interventions Electrical Stimulation;Cryotherapy;Moist Heat;Gait training;Stair training;Functional mobility training;Therapeutic activities;Therapeutic exercise;Balance training;Patient/family education;Neuromuscular re-education;Manual techniques;Passive range of motion   PT Next Visit Plan Nustep, balance exercises, hip and knee strenth, knee ROM exercises   Consulted and Agree with Plan of Care Patient      Patient will benefit from skilled therapeutic intervention in order to improve the following deficits and impairments:  Abnormal gait, Decreased mobility, Hypomobility, Decreased strength, Decreased range of motion, Decreased endurance, Decreased activity tolerance  Visit Diagnosis: Other abnormalities of gait and mobility  Other stiff joint, of the lower leg, left     Problem List Patient Active Problem List   Diagnosis Date Noted  . Acute blood loss anemia 04/02/2014  . Colitis 04/01/2014  . Diastolic dysfunction with chronic heart failure  ([place]) 04/01/2014  . GERD (gastroesophageal reflux disease) 05/02/2013  . Diverticulosis of large intestine 05/02/2013  . OA (osteoarthritis) of knee 05/01/2013  . Arm edema - right 12/29/2012  . Bilateral  leg edema 12/29/2012    Class: History of  . Essential hypertension   . Dyslipidemia, goal LDL below 100    Sigurd Sos, PT 06/25/2015 10:03 AM  [facility] [address] [place], [place], [address] Phone: [phone]   Fax:  [phone]  Name: [name] Morton MRN: [id number] Date of Birth: 14-Jun-1926

## 2015-06-27 ENCOUNTER — Ambulatory Visit: Payer: Medicare Other

## 2015-06-27 DIAGNOSIS — R2689 Other abnormalities of gait and mobility: Secondary | ICD-10-CM

## 2015-06-27 DIAGNOSIS — M79605 Pain in left leg: Secondary | ICD-10-CM | POA: Diagnosis not present

## 2015-06-27 DIAGNOSIS — M25662 Stiffness of left knee, not elsewhere classified: Secondary | ICD-10-CM

## 2015-06-27 NOTE — Therapy (Signed)
[facility] [address], [place], [place], [address] Phone: [phone]   Fax:  [phone]  Physical Therapy Treatment  Patient Details  Name: [name] Morton MRN: [id number] Date of Birth: 1926-07-03 Referring Provider: Dr. Butch Penny GAtes  Encounter Date: 06/27/2015      PT End of Session - 06/27/15 0958    Visit Number 8   Number of Visits 10   Date for PT Re-Evaluation 07/24/15   PT Start Time 0932   PT Stop Time 1011   PT Time Calculation (min) 39 min   Activity Tolerance Patient tolerated treatment well   Behavior During Therapy [facility] for tasks assessed/performed      Past Medical History  Diagnosis Date  . History of nuclear stress test 11/2009    dipyridamole; no evidence of ischemia, normal, low risk   . Dyslipidemia   . Breast cancer ([place]) 1997    right - tx with lumpectomty & radiation  . Hearing impairment     BILATERAL HEARING AIDS  . History of shingles 1998    NO RESIDUAL PROBLEMS  . Sinus problem   . Shortness of breath     WHEN CLIMBING STEPS AT MY HOME--OTHERWISE OK  . GERD (gastroesophageal reflux disease)     RARE - NO MEDICATIONS  . Lymphedema of arm     RIGHT   . OA (osteoarthritis)     OA AND PAIN LEFT KNEE  . Diverticulosis   . Sleep difficulties     ALWAYS HAD SLEEPING PROBLEMS - MEDICATION HELPS PT SLEEP ABOUT 3 HOURS  . Hypertension     SEES CARDIOLOGIST DR. DAVID HARDING FOR HYPERTENSION     Past Surgical History  Procedure Laterality Date  . Replacement total knee  2002  . Rotator cuff repair Right 12/2009    Dr. Lenna Sciara. [place]   . Appendectomy  1951  . Abdominal hysterectomy  1973  . Transthoracic echocardiogram  05/1994    trace mitral/tricuspic/aortic inusff; hyperkinetic LV  . Eye surgery      BILATERAL CATARACT EXTRACTIONS AND LENS IMPLANTS  . Breast lumpectomy Right 1997  . Breast adenoma  1950    EXCISED  . Total knee arthroplasty Left 05/01/2013     Procedure: LEFT TOTAL KNEE ARTHROPLASTY;  Surgeon: Gearlean Alf, MD;  Location: [facility];  Service: Orthopedics;  Laterality: Left;    There were no vitals filed for this visit.      Subjective Assessment - 06/27/15 0941    Subjective Feeling better today.     Currently in Pain? No/denies                         [facility] Adult PT Treatment/Exercise - 06/27/15 0001    Knee/Hip Exercises: Stretches   Active Hamstring Stretch Left;Right;2 reps;20 seconds  sitting    Knee/Hip Exercises: Aerobic   Nustep Level 2 x 10  minutes  seat 5, arms 10   Knee/Hip Exercises: Standing   Hip Abduction Stengthening;Both;10 reps;3 sets   Abduction Limitations 3# added   Hip Extension Stengthening;10 reps;3 sets   Extension Limitations 3# added   Rocker Board 3 minutes   Rebounder weight shifting 3 ways x 1 minute each   Other Standing Knee Exercises alternating toe taps on 6" step.  3x20 reps without UE support   Knee/Hip Exercises: Seated   Long Arc Quad Strengthening;Left;20 reps;Weights  2 sets   Long Arc Quad Weight 3 lbs.  Marching Limitations 2x10 reps   Marching Weights 3 lbs.                  PT Short Term Goals - 06/11/15 0946    PT SHORT TERM GOAL #1   Title indpendent with initial HEP   Time 4   Period Weeks   Status Achieved   PT SHORT TERM GOAL #2   Title knee pain with stairs decreased by 25%   Time 4   Period Weeks   Status Achieved   PT SHORT TERM GOAL #3   Title timed up and go test </= 15 sec no assistive device   Time 4   Period Weeks   Status Achieved  13 sec   PT SHORT TERM GOAL #4   Title sit to stand </= 25 sec   Time 4   Period Weeks   Status Achieved  13 sec           PT Long Term Goals - 06/25/15 [id number]    PT LONG TERM GOAL #1   Title indpendent with HEP   Time 8   Status On-going   PT LONG TERM GOAL #4   Title go up and down steps with step over step and pain decreased >/= 50%   Time 8   Period Weeks   Status  On-going  getting better-~25%    PT LONG TERM GOAL #6   Title left knee flexion >/= 105 degrees AROM so she is able to get up and down from the floor   Status Achieved               Plan - 06/27/15 0941    Clinical Impression Statement Pt is feeling better today.  Pt with improved endurance and is able to do NuStep for 10 minutes today.  Pt reports that she descends with step-over-step at time with the use of a rail. Pt performed alternating toe taps on 6" step without UE support today.  Pt with continued strenght and endurance deficits and will continue to benefit from skilled PT for strength, balance and endurance progression.     Rehab Potential Excellent   PT Frequency 2x / week   PT Duration 8 weeks   PT Treatment/Interventions Electrical Stimulation;Cryotherapy;Moist Heat;Gait training;Stair training;Functional mobility training;Therapeutic activities;Therapeutic exercise;Balance training;Patient/family education;Neuromuscular re-education;Manual techniques;Passive range of motion   PT Next Visit Plan Nustep, balance exercises, hip and knee strenth, knee ROM exercises   Consulted and Agree with Plan of Care Patient      Patient will benefit from skilled therapeutic intervention in order to improve the following deficits and impairments:  Abnormal gait, Decreased mobility, Hypomobility, Decreased strength, Decreased range of motion, Decreased endurance, Decreased activity tolerance  Visit Diagnosis: Other abnormalities of gait and mobility  Other stiff joint, of the lower leg, left  Pain In Left Leg     Problem List Patient Active Problem List   Diagnosis Date Noted  . Acute blood loss anemia 04/02/2014  . Colitis 04/01/2014  . Diastolic dysfunction with chronic heart failure ([place]) 04/01/2014  . GERD (gastroesophageal reflux disease) 05/02/2013  . Diverticulosis of large intestine 05/02/2013  . OA (osteoarthritis) of knee 05/01/2013  . Arm edema - right 12/29/2012   . Bilateral leg edema 12/29/2012    Class: History of  . Essential hypertension   . Dyslipidemia, goal LDL below 100      Sigurd Sos, PT 06/27/2015 10:09 AM  [facility] [address]  [address], [place], [place], [address] Phone: [phone]   Fax:  [phone]  Name: [name] Morton MRN: [id number] Date of Birth: June 12, 1926

## 2015-07-02 ENCOUNTER — Ambulatory Visit: Payer: Medicare Other | Attending: Family Medicine

## 2015-07-02 DIAGNOSIS — M79605 Pain in left leg: Secondary | ICD-10-CM | POA: Diagnosis not present

## 2015-07-02 DIAGNOSIS — R2689 Other abnormalities of gait and mobility: Secondary | ICD-10-CM

## 2015-07-02 DIAGNOSIS — M25662 Stiffness of left knee, not elsewhere classified: Secondary | ICD-10-CM | POA: Diagnosis not present

## 2015-07-02 NOTE — Therapy (Signed)
[facility] [address], [place], [place], [address] Phone: [phone]   Fax:  [phone]  Physical Therapy Treatment  Patient Details  Name: [name] Morton MRN: [id number] Date of Birth: 1926/04/25 Referring Provider: Dr. Butch Penny GAtes  Encounter Date: 07/02/2015      PT End of Session - 07/02/15 1008    Visit Number 9   Number of Visits 10   Date for PT Re-Evaluation 07/24/15   Authorization Type [employer]; g-code on 10th visit, Kx modifier for 15 th   PT Start Time 0929   PT Stop Time 1011   PT Time Calculation (min) 42 min   Activity Tolerance Patient tolerated treatment well   Behavior During Therapy [facility] for tasks assessed/performed      Past Medical History  Diagnosis Date  . History of nuclear stress test 11/2009    dipyridamole; no evidence of ischemia, normal, low risk   . Dyslipidemia   . Breast cancer ([place]) 1997    right - tx with lumpectomty & radiation  . Hearing impairment     BILATERAL HEARING AIDS  . History of shingles 1998    NO RESIDUAL PROBLEMS  . Sinus problem   . Shortness of breath     WHEN CLIMBING STEPS AT MY HOME--OTHERWISE OK  . GERD (gastroesophageal reflux disease)     RARE - NO MEDICATIONS  . Lymphedema of arm     RIGHT   . OA (osteoarthritis)     OA AND PAIN LEFT KNEE  . Diverticulosis   . Sleep difficulties     ALWAYS HAD SLEEPING PROBLEMS - MEDICATION HELPS PT SLEEP ABOUT 3 HOURS  . Hypertension     SEES CARDIOLOGIST DR. DAVID HARDING FOR HYPERTENSION     Past Surgical History  Procedure Laterality Date  . Replacement total knee  2002  . Rotator cuff repair Right 12/2009    Dr. Lenna Sciara. [place]   . Appendectomy  1951  . Abdominal hysterectomy  1973  . Transthoracic echocardiogram  05/1994    trace mitral/tricuspic/aortic inusff; hyperkinetic LV  . Eye surgery      BILATERAL CATARACT EXTRACTIONS AND LENS IMPLANTS  . Breast lumpectomy Right 1997  . Breast adenoma   1950    EXCISED  . Total knee arthroplasty Left 05/01/2013    Procedure: LEFT TOTAL KNEE ARTHROPLASTY;  Surgeon: Gearlean Alf, MD;  Location: [facility];  Service: Orthopedics;  Laterality: Left;    There were no vitals filed for this visit.      Subjective Assessment - 07/02/15 0943    Subjective Pt reports that she is feeling stronger and endurance is better.     Currently in Pain? No/denies                         [facility] Adult PT Treatment/Exercise - 07/02/15 0001    Knee/Hip Exercises: Stretches   Active Hamstring Stretch Left;Right;2 reps;20 seconds  sitting    Knee/Hip Exercises: Aerobic   Nustep Level 2 x 10  minutes  seat 5, arms 10   Knee/Hip Exercises: Standing   Hip Abduction Stengthening;Both;10 reps;3 sets   Abduction Limitations 3# added   Hip Extension Stengthening;10 reps;3 sets   Extension Limitations 3# added   Rocker Board 3 minutes   Rebounder weight shifting 3 ways x 1 minute each   Other Standing Knee Exercises alternating toe taps on 6" step.  3x20 reps without UE support  Other Standing Knee Exercises mini squats 2x10   Knee/Hip Exercises: Seated   Long Arc Quad Strengthening;Left;20 reps;Weights  2 sets   Long Arc Quad Weight 3 lbs.   Marching Limitations 2x10 reps   Marching Weights 3 lbs.                  PT Short Term Goals - 06/11/15 0946    PT SHORT TERM GOAL #1   Title indpendent with initial HEP   Time 4   Period Weeks   Status Achieved   PT SHORT TERM GOAL #2   Title knee pain with stairs decreased by 25%   Time 4   Period Weeks   Status Achieved   PT SHORT TERM GOAL #3   Title timed up and go test </= 15 sec no assistive device   Time 4   Period Weeks   Status Achieved  13 sec   PT SHORT TERM GOAL #4   Title sit to stand </= 25 sec   Time 4   Period Weeks   Status Achieved  13 sec           PT Long Term Goals - 07/02/15 0945    PT LONG TERM GOAL #1   Title indpendent with HEP   Time 8    Period Weeks   Status On-going   PT LONG TERM GOAL #4   Title go up and down steps with step over step and pain decreased >/= 50%   Time 8   Period Weeks   Status Achieved   PT LONG TERM GOAL #6   Title left knee flexion >/= 105 degrees AROM so she is able to get up and down from the floor   Status Achieved               Plan - 07/02/15 0947    Clinical Impression Statement Pt reports that strength and endurance are improving and she is able to do more at home.  Pt reports 60% overall improvement since the start of care.  She is able to ascend and descend steps with step-over-step gait 80% of the time.  Pt still reports difficulty with getting off the floor.  Pt will continue to benefit from skilled PT to address strength and endurance benefits.     Rehab Potential Excellent   PT Frequency 2x / week   PT Duration 8 weeks   PT Treatment/Interventions Electrical Stimulation;Cryotherapy;Moist Heat;Gait training;Stair training;Functional mobility training;Therapeutic activities;Therapeutic exercise;Balance training;Patient/family education;Neuromuscular re-education;Manual techniques;Passive range of motion   PT Next Visit Plan Nustep, balance exercises, hip and knee strenth, knee ROM exercises. G-codes next session   Consulted and Agree with Plan of Care Patient      Patient will benefit from skilled therapeutic intervention in order to improve the following deficits and impairments:  Abnormal gait, Decreased mobility, Hypomobility, Decreased strength, Decreased range of motion, Decreased endurance, Decreased activity tolerance  Visit Diagnosis: Other abnormalities of gait and mobility  Other stiff joint, of the lower leg, left     Problem List Patient Active Problem List   Diagnosis Date Noted  . Acute blood loss anemia 04/02/2014  . Colitis 04/01/2014  . Diastolic dysfunction with chronic heart failure ([place]) 04/01/2014  . GERD (gastroesophageal reflux disease) 05/02/2013   . Diverticulosis of large intestine 05/02/2013  . OA (osteoarthritis) of knee 05/01/2013  . Arm edema - right 12/29/2012  . Bilateral leg edema 12/29/2012    Class: History of  . Essential  hypertension   . Dyslipidemia, goal LDL below 100     Sigurd Sos, PT 07/02/2015 10:10 AM  [facility] [address] [place], [place], [address] Phone: [phone]   Fax:  [phone]  Name: [name] Morton MRN: [id number] Date of Birth: 17-Jun-1926

## 2015-07-04 ENCOUNTER — Ambulatory Visit: Payer: Medicare Other

## 2015-07-04 DIAGNOSIS — M79605 Pain in left leg: Secondary | ICD-10-CM | POA: Diagnosis not present

## 2015-07-04 DIAGNOSIS — R2689 Other abnormalities of gait and mobility: Secondary | ICD-10-CM

## 2015-07-04 DIAGNOSIS — M25662 Stiffness of left knee, not elsewhere classified: Secondary | ICD-10-CM | POA: Diagnosis not present

## 2015-07-04 NOTE — Therapy (Signed)
[facility] [address], [place], [place], [address] Phone: [phone]   Fax:  [phone]  Physical Therapy Treatment  Patient Details  Name: [name] Morton MRN: [id number] Date of Birth: 1926-06-20 Referring Provider: Dr. Butch Penny GAtes  Encounter Date: 07/04/2015      PT End of Session - 07/04/15 1002    Visit Number 10   Number of Visits 20   Date for PT Re-Evaluation 07/24/15   Authorization Type [employer]; g-code on 10th visit, Kx modifier for 15 th   PT Start Time 0925   PT Stop Time 1003   PT Time Calculation (min) 38 min   Activity Tolerance Patient limited by fatigue   Behavior During Therapy [facility] for tasks assessed/performed      Past Medical History  Diagnosis Date  . History of nuclear stress test 11/2009    dipyridamole; no evidence of ischemia, normal, low risk   . Dyslipidemia   . Breast cancer ([place]) 1997    right - tx with lumpectomty & radiation  . Hearing impairment     BILATERAL HEARING AIDS  . History of shingles 1998    NO RESIDUAL PROBLEMS  . Sinus problem   . Shortness of breath     WHEN CLIMBING STEPS AT MY HOME--OTHERWISE OK  . GERD (gastroesophageal reflux disease)     RARE - NO MEDICATIONS  . Lymphedema of arm     RIGHT   . OA (osteoarthritis)     OA AND PAIN LEFT KNEE  . Diverticulosis   . Sleep difficulties     ALWAYS HAD SLEEPING PROBLEMS - MEDICATION HELPS PT SLEEP ABOUT 3 HOURS  . Hypertension     SEES CARDIOLOGIST DR. DAVID HARDING FOR HYPERTENSION     Past Surgical History  Procedure Laterality Date  . Replacement total knee  2002  . Rotator cuff repair Right 12/2009    Dr. Lenna Sciara. [place]   . Appendectomy  1951  . Abdominal hysterectomy  1973  . Transthoracic echocardiogram  05/1994    trace mitral/tricuspic/aortic inusff; hyperkinetic LV  . Eye surgery      BILATERAL CATARACT EXTRACTIONS AND LENS IMPLANTS  . Breast lumpectomy Right 1997  . Breast adenoma   1950    EXCISED  . Total knee arthroplasty Left 05/01/2013    Procedure: LEFT TOTAL KNEE ARTHROPLASTY;  Surgeon: Gearlean Alf, MD;  Location: [facility];  Service: Orthopedics;  Laterality: Left;    There were no vitals filed for this visit.          [facility] PT Assessment - 07/04/15 0001    Observation/Other Assessments   Focus on Therapeutic Outcomes (FOTO)  33% limitation                     [facility] Adult PT Treatment/Exercise - 07/04/15 0001    Knee/Hip Exercises: Stretches   Active Hamstring Stretch Left;Right;2 reps;20 seconds  sitting    Knee/Hip Exercises: Aerobic   Nustep Level 2 x 10  minutes  seat 5, arms 10   Knee/Hip Exercises: Standing   Hip Abduction Stengthening;Both;10 reps;3 sets   Abduction Limitations 3# added   Hip Extension Stengthening;10 reps;3 sets   Extension Limitations 3# added   Rocker Board --   Rebounder weight shifting 3 ways x 1 minute each   Other Standing Knee Exercises --   Knee/Hip Exercises: Seated   Long Arc Quad Strengthening;Left;20 reps;Weights  2 sets   [employer]  Weight 3 lbs.   Marching Limitations 2x10 reps   Marching Weights 3 lbs.                  PT Short Term Goals - 06/11/15 0946    PT SHORT TERM GOAL #1   Title indpendent with initial HEP   Time 4   Period Weeks   Status Achieved   PT SHORT TERM GOAL #2   Title knee pain with stairs decreased by 25%   Time 4   Period Weeks   Status Achieved   PT SHORT TERM GOAL #3   Title timed up and go test </= 15 sec no assistive device   Time 4   Period Weeks   Status Achieved  13 sec   PT SHORT TERM GOAL #4   Title sit to stand </= 25 sec   Time 4   Period Weeks   Status Achieved  13 sec           PT Long Term Goals - 07/02/15 0945    PT LONG TERM GOAL #1   Title indpendent with HEP   Time 8   Period Weeks   Status On-going   PT LONG TERM GOAL #4   Title go up and down steps with step over step and pain decreased >/= 50%   Time 8    Period Weeks   Status Achieved   PT LONG TERM GOAL #6   Title left knee flexion >/= 105 degrees AROM so she is able to get up and down from the floor   Status Achieved               Plan - 07/14/2015 0939    Clinical Impression Statement Pt with fatigue today.  Pt reports improved strnegth and endurance overall.  Pt with 60% overall improvement in symptoms.  Pt is able to ascend and descent steps with step-over-step gait 80% of the time. FOTO score is 33% limitation today (72% at evaluation). Pt will benefit from skilled PT next week to finalize HEP to improve endurance and strength after D/C.     Rehab Potential Excellent   PT Frequency 2x / week   PT Duration 8 weeks   PT Treatment/Interventions Electrical Stimulation;Cryotherapy;Moist Heat;Gait training;Stair training;Functional mobility training;Therapeutic activities;Therapeutic exercise;Balance training;Patient/family education;Neuromuscular re-education;Manual techniques;Passive range of motion   PT Next Visit Plan Nustep, balance exercises, hip and knee strenth, knee ROM exercises. 1 more week.   Consulted and Agree with Plan of Care Patient      Patient will benefit from skilled therapeutic intervention in order to improve the following deficits and impairments:  Abnormal gait, Decreased mobility, Hypomobility, Decreased strength, Decreased range of motion, Decreased endurance, Decreased activity tolerance  Visit Diagnosis: Other abnormalities of gait and mobility  Other stiff joint, of the lower leg, left       G-Codes - Jul 14, 2015 0952    Functional Assessment Tool Used FOTO: 33% limitation   Functional Limitation Mobility: Walking and moving around   Mobility: Walking and Moving Around Current Status [phone]) At least 20 percent but less than 40 percent impaired, limited or restricted   Mobility: Walking and Moving Around Goal Status [phone]) At least 20 percent but less than 40 percent impaired, limited or restricted       Problem List Patient Active Problem List   Diagnosis Date Noted  . Acute blood loss anemia 04/02/2014  . Colitis 04/01/2014  . Diastolic dysfunction with chronic heart failure ([place]) 04/01/2014  .  GERD (gastroesophageal reflux disease) 05/02/2013  . Diverticulosis of large intestine 05/02/2013  . OA (osteoarthritis) of knee 05/01/2013  . Arm edema - right 12/29/2012  . Bilateral leg edema 12/29/2012    Class: History of  . Essential hypertension   . Dyslipidemia, goal LDL below 100      Sigurd Sos, PT 07/04/2015 10:04 AM  [facility] [address], [place], [place], [address] Phone: [phone]   Fax:  [phone]  Name: [name] MRN: [id number] Date of Birth: May 15, 1926

## 2015-07-09 ENCOUNTER — Ambulatory Visit: Payer: Medicare Other

## 2015-07-09 DIAGNOSIS — M79605 Pain in left leg: Secondary | ICD-10-CM

## 2015-07-09 DIAGNOSIS — M25662 Stiffness of left knee, not elsewhere classified: Secondary | ICD-10-CM

## 2015-07-09 DIAGNOSIS — R2689 Other abnormalities of gait and mobility: Secondary | ICD-10-CM

## 2015-07-09 NOTE — Therapy (Signed)
[facility] [address], [place], [place], [address] Phone: [phone]   Fax:  [phone]  Physical Therapy Treatment  Patient Details  Name: [name] Morton MRN: [id number] Date of Birth: 06-08-1926 Referring Provider: Dr. Butch Penny GAtes  Encounter Date: 07/09/2015      PT End of Session - 07/09/15 1008    Visit Number 11   Number of Visits 20   Date for PT Re-Evaluation 07/24/15   Authorization Type [employer]; g-code on 10th visit, Kx modifier for 15 th   PT Start Time 0928   PT Stop Time 1010   PT Time Calculation (min) 42 min   Activity Tolerance Patient tolerated treatment well   Behavior During Therapy [facility] for tasks assessed/performed      Past Medical History  Diagnosis Date  . History of nuclear stress test 11/2009    dipyridamole; no evidence of ischemia, normal, low risk   . Dyslipidemia   . Breast cancer ([place]) 1997    right - tx with lumpectomty & radiation  . Hearing impairment     BILATERAL HEARING AIDS  . History of shingles 1998    NO RESIDUAL PROBLEMS  . Sinus problem   . Shortness of breath     WHEN CLIMBING STEPS AT MY HOME--OTHERWISE OK  . GERD (gastroesophageal reflux disease)     RARE - NO MEDICATIONS  . Lymphedema of arm     RIGHT   . OA (osteoarthritis)     OA AND PAIN LEFT KNEE  . Diverticulosis   . Sleep difficulties     ALWAYS HAD SLEEPING PROBLEMS - MEDICATION HELPS PT SLEEP ABOUT 3 HOURS  . Hypertension     SEES CARDIOLOGIST DR. DAVID HARDING FOR HYPERTENSION     Past Surgical History  Procedure Laterality Date  . Replacement total knee  2002  . Rotator cuff repair Right 12/2009    Dr. Lenna Sciara. [place]   . Appendectomy  1951  . Abdominal hysterectomy  1973  . Transthoracic echocardiogram  05/1994    trace mitral/tricuspic/aortic inusff; hyperkinetic LV  . Eye surgery      BILATERAL CATARACT EXTRACTIONS AND LENS IMPLANTS  . Breast lumpectomy Right 1997  . Breast  adenoma  1950    EXCISED  . Total knee arthroplasty Left 05/01/2013    Procedure: LEFT TOTAL KNEE ARTHROPLASTY;  Surgeon: Gearlean Alf, MD;  Location: [facility];  Service: Orthopedics;  Laterality: Left;    There were no vitals filed for this visit.                       [facility] Adult PT Treatment/Exercise - 07/09/15 0001    Knee/Hip Exercises: Stretches   Active Hamstring Stretch Left;Right;2 reps;20 seconds  sitting    Knee/Hip Exercises: Aerobic   Nustep Level 2 x 10  minutes  seat 5, arms 10   Knee/Hip Exercises: Standing   Hip Abduction Stengthening;Both;10 reps;3 sets   Abduction Limitations 3# added   Hip Extension Stengthening;10 reps;3 sets   Extension Limitations 3# added   Rocker Board 3 minutes   Rebounder weight shifting 3 ways x 1 minute each   Other Standing Knee Exercises alternating toe taps on 6" step.  3x20 reps without UE support   Other Standing Knee Exercises mini squats 2x10   Knee/Hip Exercises: Seated   Long Arc Quad Strengthening;Left;20 reps;Weights  2 sets   Long Arc Quad Weight 3 lbs.   Marching Limitations  2x10 reps   Marching Weights 3 lbs.                  PT Short Term Goals - 06/11/15 0946    PT SHORT TERM GOAL #1   Title indpendent with initial HEP   Time 4   Period Weeks   Status Achieved   PT SHORT TERM GOAL #2   Title knee pain with stairs decreased by 25%   Time 4   Period Weeks   Status Achieved   PT SHORT TERM GOAL #3   Title timed up and go test </= 15 sec no assistive device   Time 4   Period Weeks   Status Achieved  13 sec   PT SHORT TERM GOAL #4   Title sit to stand </= 25 sec   Time 4   Period Weeks   Status Achieved  13 sec           PT Long Term Goals - 07/02/15 0945    PT LONG TERM GOAL #1   Title indpendent with HEP   Time 8   Period Weeks   Status On-going   PT LONG TERM GOAL #4   Title go up and down steps with step over step and pain decreased >/= 50%   Time 8   Period  Weeks   Status Achieved   PT LONG TERM GOAL #6   Title left knee flexion >/= 105 degrees AROM so she is able to get up and down from the floor   Status Achieved               Plan - 07/09/15 0941    Clinical Impression Statement Pt reports that she is able to ascend and descend steps with 50% increased ease.  Pt with 60% overall improvement in symptoms including pain, strength and endurance.  Able to do yardwork yesterday for the first time in many years.  FOTO score was 33% limitation last session.  Pt will attend 1 more PT session to finalize HEP for strength and endurance and D/C to HEP next session.     Rehab Potential Excellent   PT Frequency 2x / week   PT Duration 8 weeks   PT Treatment/Interventions Electrical Stimulation;Cryotherapy;Moist Heat;Gait training;Stair training;Functional mobility training;Therapeutic activities;Therapeutic exercise;Balance training;Patient/family education;Neuromuscular re-education;Manual techniques;Passive range of motion   PT Next Visit Plan 1 more session.  G-codes, finalize HEP, strength and endurance    Consulted and Agree with Plan of Care Patient      Patient will benefit from skilled therapeutic intervention in order to improve the following deficits and impairments:  Abnormal gait, Decreased mobility, Hypomobility, Decreased strength, Decreased range of motion, Decreased endurance, Decreased activity tolerance  Visit Diagnosis: Other abnormalities of gait and mobility  Other stiff joint, of the lower leg, left  Pain In Left Leg     Problem List Patient Active Problem List   Diagnosis Date Noted  . Acute blood loss anemia 04/02/2014  . Colitis 04/01/2014  . Diastolic dysfunction with chronic heart failure ([place]) 04/01/2014  . GERD (gastroesophageal reflux disease) 05/02/2013  . Diverticulosis of large intestine 05/02/2013  . OA (osteoarthritis) of knee 05/01/2013  . Arm edema - right 12/29/2012  . Bilateral leg edema  12/29/2012    Class: History of  . Essential hypertension   . Dyslipidemia, goal LDL below 100      Sigurd Sos, PT 07/09/2015 10:09 AM  [facility] [address]  [place], [place], [place], [address] Phone: [phone]   Fax:  [phone]  Name: Kaitlan [name] MRN: [id number] Date of Birth: 1926-04-05

## 2015-07-11 ENCOUNTER — Ambulatory Visit: Payer: Medicare Other

## 2015-07-11 DIAGNOSIS — M79605 Pain in left leg: Secondary | ICD-10-CM

## 2015-07-11 DIAGNOSIS — R2689 Other abnormalities of gait and mobility: Secondary | ICD-10-CM

## 2015-07-11 DIAGNOSIS — M25662 Stiffness of left knee, not elsewhere classified: Secondary | ICD-10-CM

## 2015-07-11 NOTE — Therapy (Signed)
[facility] [address], [place], [place], [address] Phone: [phone]   Fax:  [phone]  Physical Therapy Treatment  Patient Details  Name: [name] Morton MRN: [id number] Date of Birth: Sep 29, 1926 Referring Provider: Dr. Butch Penny GAtes  Encounter Date: 07/11/2015      PT End of Session - 07/11/15 1002    Visit Number 12   Authorization Type [employer]; g-code on 10th visit, Kx modifier for 15 th   PT Start Time 0925   PT Stop Time 1011   PT Time Calculation (min) 46 min   Activity Tolerance Patient tolerated treatment well   Behavior During Therapy [facility] for tasks assessed/performed      Past Medical History  Diagnosis Date  . History of nuclear stress test 11/2009    dipyridamole; no evidence of ischemia, normal, low risk   . Dyslipidemia   . Breast cancer ([place]) 1997    right - tx with lumpectomty & radiation  . Hearing impairment     BILATERAL HEARING AIDS  . History of shingles 1998    NO RESIDUAL PROBLEMS  . Sinus problem   . Shortness of breath     WHEN CLIMBING STEPS AT MY HOME--OTHERWISE OK  . GERD (gastroesophageal reflux disease)     RARE - NO MEDICATIONS  . Lymphedema of arm     RIGHT   . OA (osteoarthritis)     OA AND PAIN LEFT KNEE  . Diverticulosis   . Sleep difficulties     ALWAYS HAD SLEEPING PROBLEMS - MEDICATION HELPS PT SLEEP ABOUT 3 HOURS  . Hypertension     SEES CARDIOLOGIST DR. DAVID HARDING FOR HYPERTENSION     Past Surgical History  Procedure Laterality Date  . Replacement total knee  2002  . Rotator cuff repair Right 12/2009    Dr. Lenna Sciara. [place]   . Appendectomy  1951  . Abdominal hysterectomy  1973  . Transthoracic echocardiogram  05/1994    trace mitral/tricuspic/aortic inusff; hyperkinetic LV  . Eye surgery      BILATERAL CATARACT EXTRACTIONS AND LENS IMPLANTS  . Breast lumpectomy Right 1997  . Breast adenoma  1950    EXCISED  . Total knee arthroplasty Left  05/01/2013    Procedure: LEFT TOTAL KNEE ARTHROPLASTY;  Surgeon: Gearlean Alf, MD;  Location: [facility];  Service: Orthopedics;  Laterality: Left;    There were no vitals filed for this visit.          [facility] PT Assessment - 07/11/15 0001    Assessment   Medical Diagnosis R26.9 Gait difficulty   Onset Date/Surgical Date 04/24/15   Observation/Other Assessments   Focus on Therapeutic Outcomes (FOTO)  33% limitation   AROM   Right/Left Knee Left   Left Knee Flexion 110  supine   Strength   Right/Left Knee Left   Left Knee Flexion 4+/5   Left Knee Extension 4+/5   Standardized Balance Assessment   Standardized Balance Assessment Five Times Sit to Stand   Five times sit to stand comments  13 sec  12 sec, 10 sec                     [facility] Adult PT Treatment/Exercise - 07/11/15 0001    Knee/Hip Exercises: Stretches   Active Hamstring Stretch Left;Right;2 reps;20 seconds  sitting    Knee/Hip Exercises: Aerobic   Nustep Level 2 x 10  minutes  seat 5, arms 10   Knee/Hip Exercises:  Standing   Hip Abduction Stengthening;Both;10 reps;3 sets   Abduction Limitations 3# added   Hip Extension Stengthening;10 reps;3 sets   Extension Limitations 3# added   Rocker Board 3 minutes   Rebounder weight shifting 3 ways x 1 minute each   Other Standing Knee Exercises alternating toe taps on 6" step.  3x20 reps without UE support   Knee/Hip Exercises: Seated   Long Arc Quad Strengthening;Left;20 reps;Weights  2 sets   Long Arc Quad Weight 3 lbs.   Marching Limitations 2x10 reps   Marching Weights 3 lbs.                PT Education - 07/11/15 0954    Education provided Yes   Education Details standing hip abduction and extension, seated HEP   Person(s) Educated Patient   Methods Explanation;Demonstration;Handout   Comprehension Verbalized understanding;Returned demonstration          PT Short Term Goals - 06/11/15 0946    PT SHORT TERM GOAL #1   Title  indpendent with initial HEP   Time 4   Period Weeks   Status Achieved   PT SHORT TERM GOAL #2   Title knee pain with stairs decreased by 25%   Time 4   Period Weeks   Status Achieved   PT SHORT TERM GOAL #3   Title timed up and go test </= 15 sec no assistive device   Time 4   Period Weeks   Status Achieved  13 sec   PT SHORT TERM GOAL #4   Title sit to stand </= 25 sec   Time 4   Period Weeks   Status Achieved  13 sec           PT Long Term Goals - 07/11/15 0934    PT LONG TERM GOAL #1   Title indpendent with HEP   Status Achieved   PT LONG TERM GOAL #2   Title timed up and go </= 13 sec   Status Achieved   PT LONG TERM GOAL #3   Title sit to stand </15 sec   Status Achieved   PT LONG TERM GOAL #4   Title go up and down steps with step over step and pain decreased >/= 50%   Status Achieved   PT LONG TERM GOAL #5   Title left knee strength 4+/5 so she is able to ambulate with no assistive device in the community   Status Achieved   PT LONG TERM GOAL #6   Title left knee flexion >/= 105 degrees AROM so she is able to get up and down from the floor   Status Achieved               Plan - 07/11/15 0943    Clinical Impression Statement Pt is ready for D/C to HEP.  Pt reports 60% overall improvement in symptoms since the start of care including pain, strenth and endurance.  Pt is able to do yardwork now.  Pt with 4+/5 Lt knee strength and 110 degrees of flexion.  FOTO is 33% limitation.     PT Next Visit Plan D/C PT to HEP   Consulted and Agree with Plan of Care Patient      Patient will benefit from skilled therapeutic intervention in order to improve the following deficits and impairments:     Visit Diagnosis: Other abnormalities of gait and mobility  Other stiff joint, of the lower leg, left  Pain In Left Leg  G-Codes - 07/11/15 0945    Functional Assessment Tool Used FOTO: 33% limitation,    Functional Limitation Mobility: Walking and  moving around   Mobility: Walking and Moving Around Goal Status [phone]) At least 20 percent but less than 40 percent impaired, limited or restricted   Mobility: Walking and Moving Around Discharge Status [phone]) At least 20 percent but less than 40 percent impaired, limited or restricted      Problem List Patient Active Problem List   Diagnosis Date Noted  . Acute blood loss anemia 04/02/2014  . Colitis 04/01/2014  . Diastolic dysfunction with chronic heart failure ([place]) 04/01/2014  . GERD (gastroesophageal reflux disease) 05/02/2013  . Diverticulosis of large intestine 05/02/2013  . OA (osteoarthritis) of knee 05/01/2013  . Arm edema - right 12/29/2012  . Bilateral leg edema 12/29/2012    Class: History of  . Essential hypertension   . Dyslipidemia, goal LDL below 100    PHYSICAL THERAPY DISCHARGE SUMMARY  Visits from Start of Care: 12  Current functional level related to goals / functional outcomes: See above for current status.     Remaining deficits: Lt knee strength and endurance deficits.  Pt will continue with HEP for continued gains and follow-up with MD as needed.     Education / Equipment: HEP, falls risk Plan: Patient agrees to discharge.  Patient goals were met. Patient is being discharged due to meeting the stated rehab goals.  ?????     Sigurd Sos, PT 07/11/2015 10:12 AM  [facility] [address], [place], [place], [address] Phone: [phone]   Fax:  [phone]  Name: Chrishana [name] MRN: [id number] Date of Birth: 07-06-1926

## 2015-07-11 NOTE — Patient Instructions (Signed)
KNEE: Extension, Long Arc Quad (Weight)  Place weight around leg. Raise leg until knee is straight. Hold _5__ seconds. Use ___ lb weight. _10__ reps per set (each leg), 4-5__ sets per day, __7_ days per week  Copyright  VHI. All rights reserved.      Knee Raise   Lift knee and then lower it. Repeat with other knee. Repeat _10__ times each leg. Do _4-5___ sessions per day.  http://gt2.exer.us/445   Copyright  VHI. All rights reserved.  Toe Up   Gently rise up on toes and back on heels. Repeat _20___ times. Do 4-5____ sessions per day.       ABDUCTION: Standing (Active)   Stand, feet flat. Lift right leg out to side. Use _0__ lbs. Complete __10_ repetitions. Perform __2_ sessions per day.     EXTENSION: Standing (Active)  Stand, both feet flat. Draw right leg behind body as far as possible. Use 0___ lbs. Complete 10 repetitions. Perform __2_ sessions per day.  Copyright  VHI. All rights reserved.    [facility] [address] [place], [place] [address] Phone # [phone] Fax [phone]

## 2015-10-07 ENCOUNTER — Other Ambulatory Visit: Payer: Self-pay | Admitting: Cardiology

## 2015-12-02 ENCOUNTER — Other Ambulatory Visit: Payer: Self-pay | Admitting: Family Medicine

## 2015-12-02 DIAGNOSIS — Z1231 Encounter for screening mammogram for malignant neoplasm of breast: Secondary | ICD-10-CM

## 2015-12-23 ENCOUNTER — Ambulatory Visit: Payer: Self-pay | Admitting: Cardiology

## 2015-12-23 DIAGNOSIS — N183 Chronic kidney disease, stage 3 (moderate): Secondary | ICD-10-CM | POA: Diagnosis not present

## 2015-12-23 DIAGNOSIS — Z23 Encounter for immunization: Secondary | ICD-10-CM | POA: Diagnosis not present

## 2015-12-23 DIAGNOSIS — E7800 Pure hypercholesterolemia, unspecified: Secondary | ICD-10-CM | POA: Diagnosis not present

## 2015-12-23 DIAGNOSIS — M25562 Pain in left knee: Secondary | ICD-10-CM | POA: Diagnosis not present

## 2015-12-23 DIAGNOSIS — R269 Unspecified abnormalities of gait and mobility: Secondary | ICD-10-CM | POA: Diagnosis not present

## 2015-12-23 DIAGNOSIS — Z0000 Encounter for general adult medical examination without abnormal findings: Secondary | ICD-10-CM | POA: Diagnosis not present

## 2015-12-23 DIAGNOSIS — I10 Essential (primary) hypertension: Secondary | ICD-10-CM | POA: Diagnosis not present

## 2015-12-23 DIAGNOSIS — G4700 Insomnia, unspecified: Secondary | ICD-10-CM | POA: Diagnosis not present

## 2015-12-24 ENCOUNTER — Ambulatory Visit: Payer: Self-pay

## 2015-12-26 ENCOUNTER — Encounter: Payer: Self-pay | Admitting: *Deleted

## 2015-12-30 ENCOUNTER — Ambulatory Visit: Payer: Medicare Other | Admitting: Cardiology

## 2016-01-10 ENCOUNTER — Other Ambulatory Visit: Payer: Self-pay | Admitting: Cardiology

## 2016-02-01 IMAGING — MR MR 3D RECON AT SCANNER
17 series · 17 of 17 positions shown · IV contrast (multihance)
Comparison: CT 03/31/2014.

CLINICAL DATA: Subsequent encounter for colitis. Biliary ductal
dilatation on abdominal CT.

EXAM:
MRI ABDOMEN WITHOUT AND WITH CONTRAST (INCLUDING MRCP)
TECHNIQUE: Multiplanar multisequence MR imaging of the abdomen was performed
both before and after the administration of intravenous contrast.
Heavily T2-weighted images of the biliary and pancreatic ducts were
obtained, and three-dimensional MRCP images were rendered by post
processing.
CONTRAST:  15mL MULTIHANCE GADOBENATE DIMEGLUMINE 529 MG/ML IV SOLN

[Series 3: T2 fat-sat · axial · 5.0mm · 0.78mm/px · 1 of 49 slices shown]
[im 1/49]
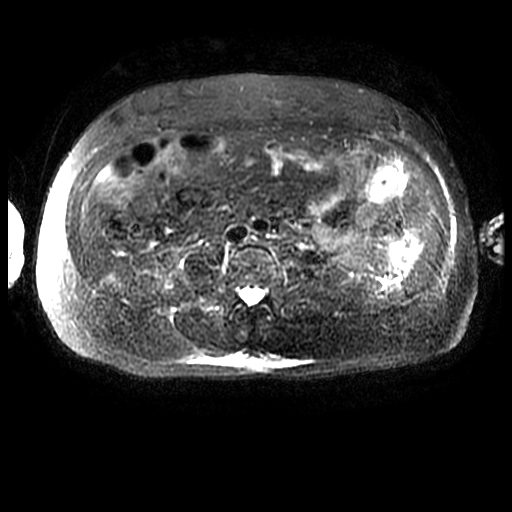

[Series 4: T2 · axial · 5.0mm · 0.78mm/px · 1 of 47 slices shown (1 of 2)]
[im 1/47]
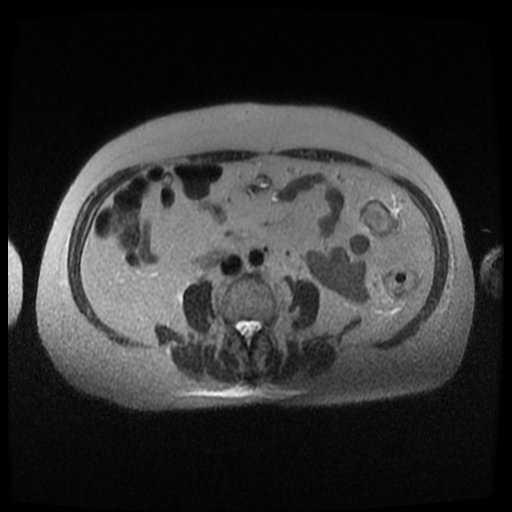

[Series 6: T2 · coronal · 5.0mm · 0.78mm/px · 1 of 38 slices shown (2 of 2)]
[im 1/38]
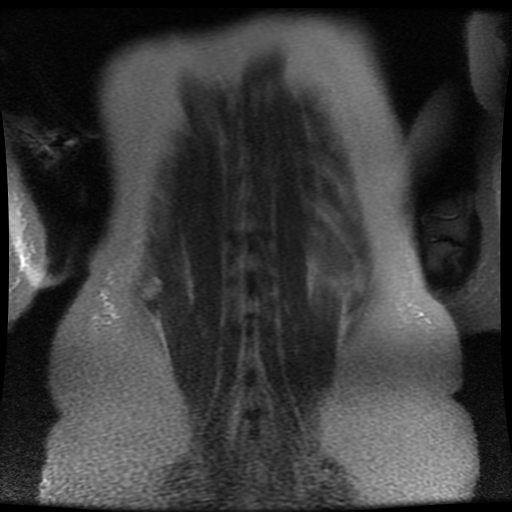

[Series 7: MRCP · coronal · 1.6mm · 0.62mm/px · 1 of 108 slices shown (1 of 3)]
[im 1/108]
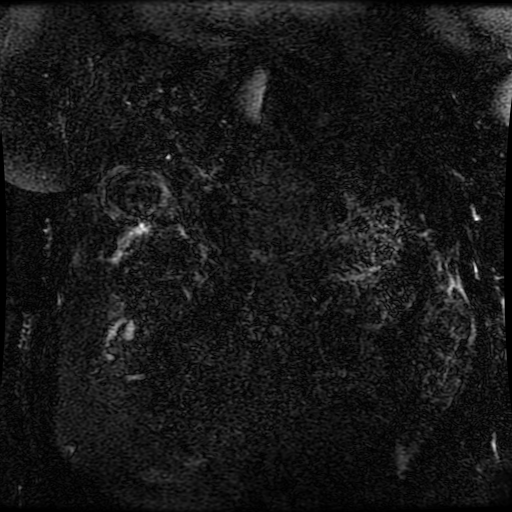

[Series 8: MRCP · coronal · 2.0mm · 0.70mm/px · 1 of 21 slices shown (2 of 3)]
[im 1/21]
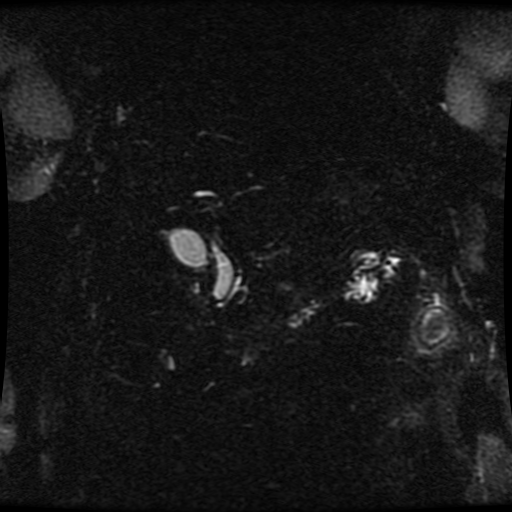

[Series 9: DWI b500 · axial · 6.0mm · 1.52mm/px · 1 of 58 slices shown]
[im 1/58]
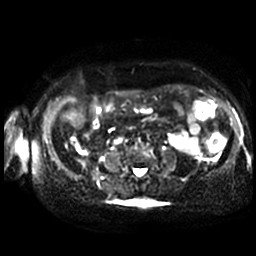

[Series 12: ax dualecho · axial · 5.0mm · 0.78mm/px · 1 of 94 slices shown]
[im 1/94]
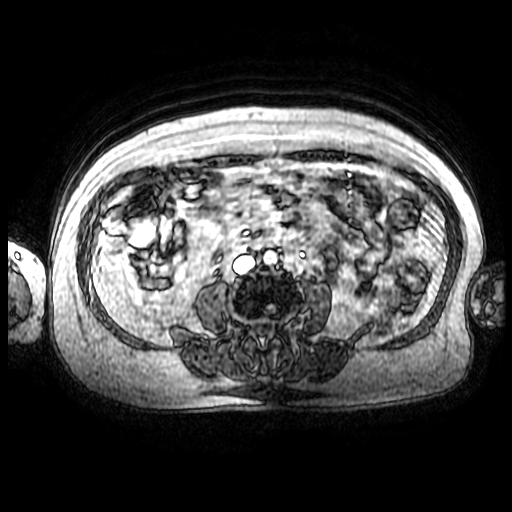

[Series 13: MRCP · sagittal · 40.0mm · 0.70mm/px · 1 of 12 slices shown (3 of 3)]
[im 1/12]
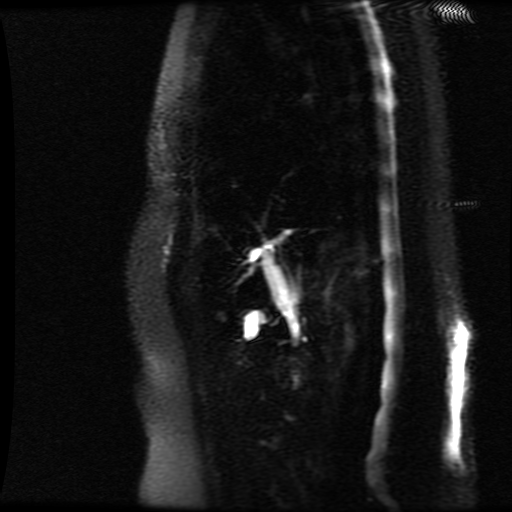

[Series 15: T1 dynamic post-contrast · coronal · 5.0mm · 0.78mm/px · 1 of 72 slices shown (1 of 2)]
[im 1/72]
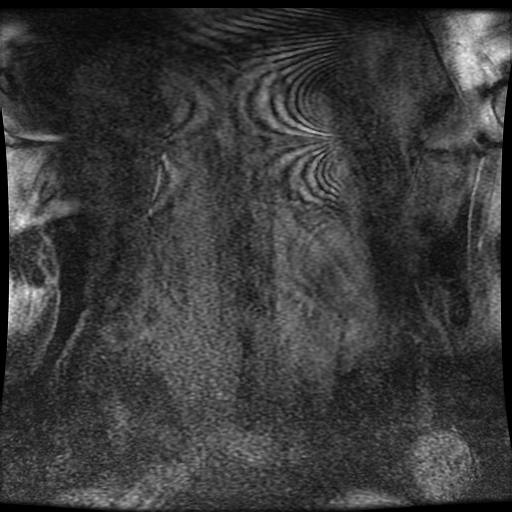

[Series 16: T1 dynamic post-contrast · coronal · 5.0mm · 0.78mm/px · 1 of 72 slices shown (2 of 2)]
[im 1/72]
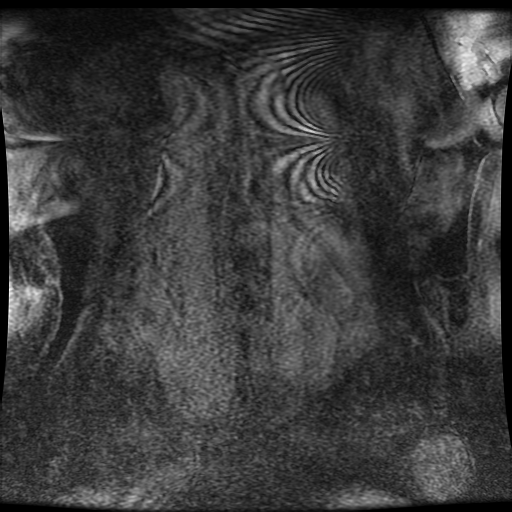

[Series 701: processed images · sagittal · 1.6mm · 0.62mm/px · 1 of 9 slices shown (1 of 2)]
[im 1/9]
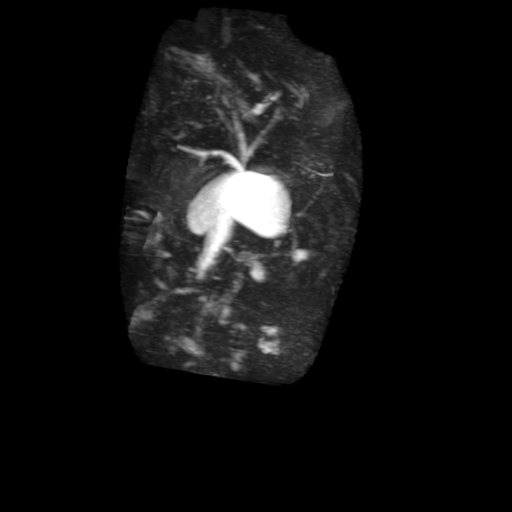

[Series 702: processed images · axial · 1.6mm · 0.62mm/px · 1 of 7 slices shown (2 of 2)]
[im 1/7]
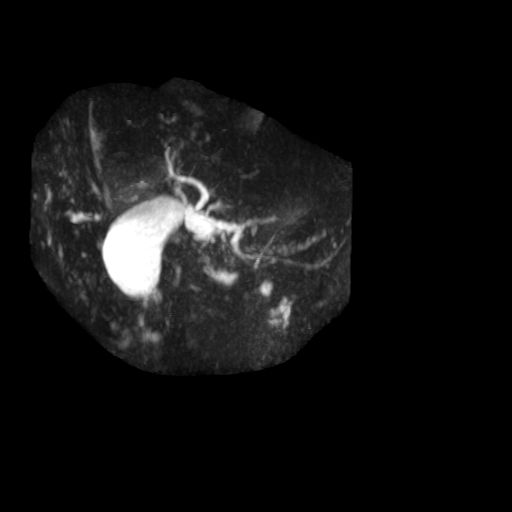

[Series 1400: T1 dynamic · axial · 5.0mm · 0.78mm/px · 1 of 100 slices shown (1 of 5)]
[im 1/100]
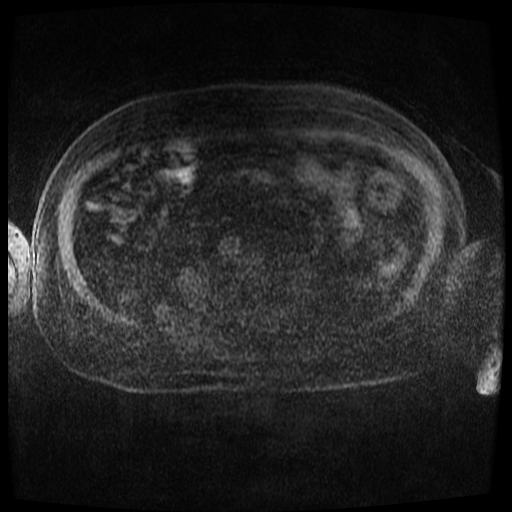

[Series 1401: T1 dynamic · axial · 5.0mm · 0.78mm/px · 1 of 100 slices shown (2 of 5)]
[im 1/100]
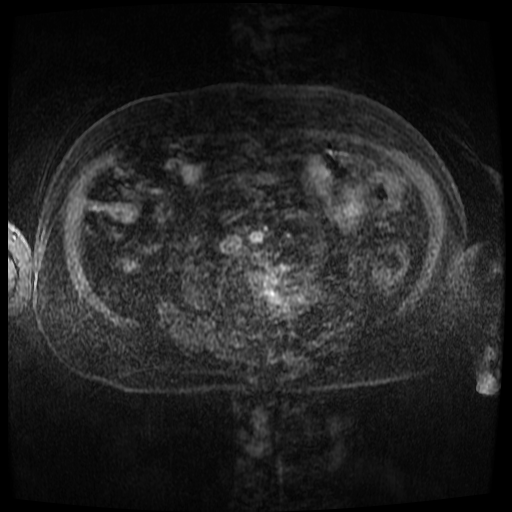

[Series 1402: T1 dynamic · axial · 5.0mm · 0.78mm/px · 1 of 100 slices shown (3 of 5)]
[im 1/100]
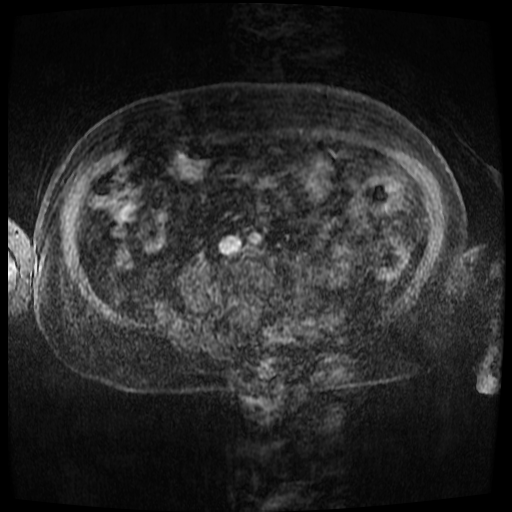

[Series 1403: T1 dynamic · axial · 5.0mm · 0.78mm/px · 1 of 100 slices shown (4 of 5)]
[im 1/100]
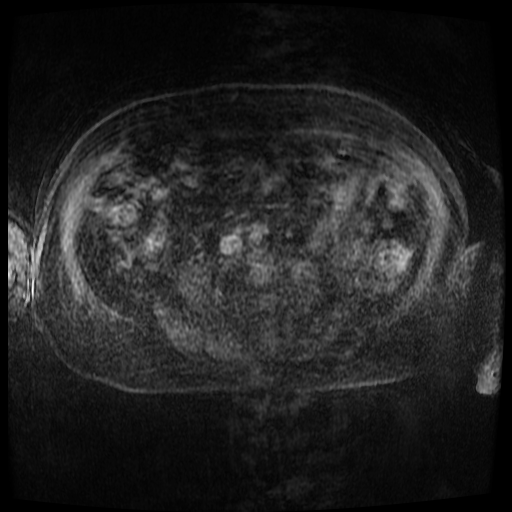

[Series 1404: T1 dynamic · axial · 5.0mm · 0.78mm/px · 1 of 100 slices shown (5 of 5)]
[im 1/100]
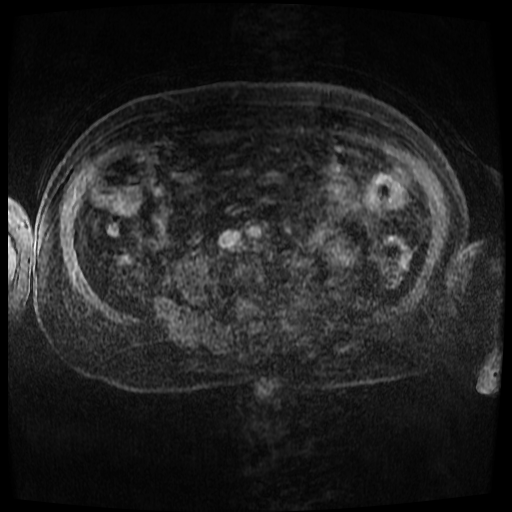

[17 of 17 positions shown; findings below may reference images not displayed]

FINDINGS: Mild-to-moderate motion degradation throughout.

Lower chest: Mild cardiomegaly. Trace bilateral pleural fluid is
likely physiologic. Right hemidiaphragm elevation.

Hepatobiliary: Lateral segment left liver lobe lesion is likely a
cyst. Intrahepatic ducts are upper normal for patient age. No
gallstones or gallbladder wall thickening. No pericholecystic edema.

The common duct is mildly dilated for patient age. 11-12 mm in the
porta hepatis. Upper normal 9-10 mm in this age group. Tapers
minimally to 11 mm just above the pancreatic head. Example image 33
of series 7. No evidence of obstructive stone or mass.

Pancreas: Pancreatic duct is upper normal for patient age, 4 mm on
image 27 of series 7. There is no evidence of acute pancreatitis.
Positioned within the pancreatic head/ uncinate process is at 9 mm
fat signal lesion, including image 38 of series 4. See also image 38
of the 03/31/2014 CT. This is positioned inferior to the common duct
insertion into the ampulla.

Spleen: Normal

Adrenals/Urinary Tract: Normal adrenal glands. Bilateral small renal
lesions which are likely cysts. No hydronephrosis.

Stomach/Bowel: Normal stomach and small bowel loops. Persistent
edema surrounding the splenic flexure colon. Example image 42 series
4.

Vascular/Lymphatic: Extensive atherosclerosis within the aorta and
branch vessels. No retroperitoneal or retrocrural adenopathy.

Other: Trace ascites within the left pericolic gutter.

Musculoskeletal: No acute osseous abnormality.
IMPRESSION: 1. Motion degradation.
2. Mild common duct dilatation for age. No obstructive stone or mass
identified. The intrahepatic ducts and pancreatic duct are felt to
be within normal limits for patient age. If the bilirubin is
elevated, or there is a strong clinical concern of ampullary
stenosis/ occult ampullary lesion, ERCP could be performed.
3. 9 mm pancreatic head/uncinate process lipoma, positioned inferior
to the insertion of the common duct into the ampulla.
4. Persistent splenic flexure colitis, in a distribution suspicious
for a ischemia.

## 2016-07-01 ENCOUNTER — Encounter: Payer: Self-pay | Admitting: Cardiology

## 2016-07-01 ENCOUNTER — Ambulatory Visit (INDEPENDENT_AMBULATORY_CARE_PROVIDER_SITE_OTHER): Payer: Medicare Other | Admitting: Cardiology

## 2016-07-01 VITALS — BP 122/56 | HR 66 | Ht 60.0 in | Wt 146.0 lb

## 2016-07-01 DIAGNOSIS — I5032 Chronic diastolic (congestive) heart failure: Secondary | ICD-10-CM | POA: Diagnosis not present

## 2016-07-01 DIAGNOSIS — E785 Hyperlipidemia, unspecified: Secondary | ICD-10-CM | POA: Diagnosis not present

## 2016-07-01 DIAGNOSIS — I10 Essential (primary) hypertension: Secondary | ICD-10-CM

## 2016-07-01 DIAGNOSIS — R5382 Chronic fatigue, unspecified: Secondary | ICD-10-CM | POA: Diagnosis not present

## 2016-07-01 DIAGNOSIS — R600 Localized edema: Secondary | ICD-10-CM | POA: Diagnosis not present

## 2016-07-01 DIAGNOSIS — R5383 Other fatigue: Secondary | ICD-10-CM | POA: Insufficient documentation

## 2016-07-01 MED ORDER — FUROSEMIDE 40 MG PO TABS: ORAL_TABLET | ORAL | 3 refills | Status: DC

## 2016-07-01 NOTE — Patient Instructions (Addendum)
MEDICATION CHANGES  WEANING  BISOPROLOL TAKE 1/2 TABLET  EVERY DAY FOR 2 WEEKS THEN DECREASE TO 1/2 TABLET EVERY OTHER DAY THENSTOPPING MEDICATIONS   TAKE EXTRA 1/2 TABLET OF 40 MG FUROSEMIDE (LASIX) FOR THE 2-3 DAYS THEN IF YOU HAVE INCREASE PUFFINESS MAY TAKE AN EXTRA 1/2 TABLET DAILY.    Your physician recommends that you schedule a follow-up appointment in [place] wants you to follow-up in [place].You will receive a reminder letter in the mail two months in advance. If you don't receive a letter, please call our office to schedule the follow-up appointment.   If you need a refill on your cardiac medications before your next appointment, please call your pharmacy.

## 2016-07-01 NOTE — Progress Notes (Signed)
PCP: Darcus Austin, MD  Clinic Note: Chief Complaint  Patient presents with  . Follow-up    frequent shortness of breath, bilateral arm pain, no chest pain, concerned about retaining fluid     HPI: [name] Morton is a 83 y.o. female with a PMH below who presents today for 18 month f/u for HTN, HLD, Edema..  She had been seeing Dr. Aldona Bar for years prior to changing to me after his retirement. She has had a cardiac workup in the distant past, but has not had any notable cardiac issues.[name] Morton was last seen on 11/28/2014 - she noted that she was doing fairly well without any major cardiac complaints. She recently had a fall due to knee discomfort. She also noted some vasovagal type syncope episode. Usually happens when she is constipated and has to strain hard to go to bathroom. Noticeable exertional dyspnea if she overdoes it. - She had gained back some weight she lost. Was trying to recover from her left knee surgery but still had some swelling. She also noted positional dizziness.  Recent Hospitalizations: None  Studies Personally Reviewed - if available, images/films reviewed: From Epic Chart or Care EveryWhere.  Lies around all day - watches TV & sleeps.  Does not follow directions well. Last THursday - R arm pain - relieved with Tylenol.    Interval History: [name] Morton presents today for delayed follow-up with Coumadin complaints. Her daughter is worried she lies around a watching TV and sleeping. She doesn't get out and she eats. The patient herself has been more worried about her arm pain and her daughter is concerned about her see me having some fluid retention. Really didn't comment on any PND or orthopnea, but she does have some mild upper and lower extremity swelling.  She denies any significant episodes of chest discomfort or pressure besides her arm being numb.  No PND, orthopnea - with minimal edema (arms more "puffy" legs and feet. No palpitations,  lightheadedness, dizziness, weakness or syncope/near syncope. No TIA/amaurosis fugax symptoms. No melena, hematochezia, hematuria, or epstaxis. No claudication.  ROS: A comprehensive was performed. Review of Systems  Constitutional: Negative for malaise/fatigue (Just doesn't get out and do things. Her daughter indicates that she lies around all day and watches TV.).  HENT: Positive for hearing loss. Negative for nosebleeds.   Eyes: Negative for blurred vision.  Respiratory: Positive for shortness of breath (Can occur with simple exertion such as clipping flowers, or walking around the house. But she doesn't really notice it when she does walking around the block.).   Cardiovascular: Positive for leg swelling. Negative for claudication.  Gastrointestinal: Negative for blood in stool, constipation and melena.  Genitourinary: Negative for hematuria.  Musculoskeletal: Positive for joint pain (Still having issues with her left knee pain. Partly as a result of this, she is still not exercising.) and myalgias (Last Thursday (April 26) she had prolonged episode of right arm pain that she woke up with and this point it was relieved with Tylenol.).       Bilateral right greater than left arm pain. She was cutting flowers with clippers head. Her arms hurt and she got short of breath.  Neurological: Positive for dizziness (Positional). Negative for focal weakness.  Psychiatric/Behavioral: Positive for memory loss. Negative for depression. The patient has insomnia. The patient is not nervous/anxious.        Her daughter worries that she is starting to show signs of anhedonia with not getting  out and doing things that she used to enjoy doing. [name] Morton spends more time sleeping all day long and does not eat right..    I have reviewed and (if needed) personally updated the patient's problem list, medications, allergies, past medical and surgical history, social and family history.   Past Medical History:    Diagnosis Date  . Breast cancer ([place]) 1997   right - tx with lumpectomty & radiation  . Diverticulosis   . Dyslipidemia   . GERD (gastroesophageal reflux disease)    RARE - NO MEDICATIONS  . Hearing impairment    BILATERAL HEARING AIDS  . History of nuclear stress test 11/2009   dipyridamole; no evidence of ischemia, normal, low risk   . History of shingles 1998   NO RESIDUAL PROBLEMS  . Hypertension    SEES CARDIOLOGIST DR. Alfons Sulkowski FOR HYPERTENSION   . Lymphedema of arm    RIGHT   . OA (osteoarthritis)    OA AND PAIN LEFT KNEE  . Shortness of breath    WHEN CLIMBING STEPS AT MY HOME--OTHERWISE OK  . Sinus problem   . Sleep difficulties    ALWAYS HAD SLEEPING PROBLEMS - MEDICATION HELPS PT SLEEP ABOUT 3 HOURS    Past Surgical History:  Procedure Laterality Date  . ABDOMINAL HYSTERECTOMY  1973  . APPENDECTOMY  1951  . BREAST ADENOMA  1950   EXCISED  . BREAST LUMPECTOMY Right 1997  . EYE SURGERY     BILATERAL CATARACT EXTRACTIONS AND LENS IMPLANTS  . REPLACEMENT TOTAL KNEE  2002  . ROTATOR CUFF REPAIR Right 12/2009   Dr. Lenna Sciara. [place]   . TOTAL KNEE ARTHROPLASTY Left 05/01/2013   Procedure: LEFT TOTAL KNEE ARTHROPLASTY;  Surgeon: Gearlean Alf, MD;  Location: [facility];  Service: Orthopedics;  Laterality: Left;  . TRANSTHORACIC ECHOCARDIOGRAM  05/1994   trace mitral/tricuspic/aortic inusff; hyperkinetic LV    Current Meds  Medication Sig  . acetaminophen (TYLENOL) 500 MG tablet Take 500 mg by mouth every 6 (six) hours as needed for moderate pain.  [name] Kitchen amLODipine-benazepril (LOTREL) 10-20 MG capsule TAKE 1 CAPSULE DAILY  . aspirin 81 MG tablet Take 81 mg by mouth daily.  . bisoprolol (ZEBETA) 5 MG tablet TAKE 1 TABLET DAILY  . furosemide (LASIX) 40 MG tablet TAKE 40 MG BY MOUTH DAILY AND MAY TAKE AN EXTRA 20 MG ( 1/2 TABLET) IF NEEDED FOR PUFFINESS  . polyethylene glycol (MIRALAX / GLYCOLAX) packet Take 17 g by mouth daily.  . simvastatin (ZOCOR) 20 MG tablet Take 1  tablet (20 mg total) by mouth at bedtime.  . traZODone (DESYREL) 100 MG tablet Take 1 tablet by mouth at bedtime.  . [DISCONTINUED] furosemide (LASIX) 40 MG tablet TAKE 1 TABLET DAILY    Allergies  Allergen Reactions  . Benadryl [Diphenhydramine Hcl (Sleep)]     Makes me hyper   . Cardizem [Diltiazem Hcl]     Doesn't remember. Dr Rex Kras told patient not to take.   [name] Kitchen Epinephrine     unknown  . Vistaril [Hydroxyzine]     unknown  . Vytorin [Ezetimibe-Simvastatin]     Not comfortable taking it. Doesn't remember reaction.     Social History   Social History  . Marital status: Widowed    Spouse name: N/A  . Number of children: 2  . Years of education: N/A   Occupational History  . retired Therapist, sports    Social History Main Topics  . Smoking status: Never Smoker  . Smokeless  tobacco: Never Used  . Alcohol use No  . Drug use: No  . Sexual activity: Not Asked   Other Topics Concern  . None   Social History Narrative   Widowed since 11/2010.  Mother of 2.  No real exercise due to knee.   [place]: No   EtOH: NO    family history includes Arthritis in her child; Asthma in her maternal grandfather; Cancer in her sister; Emphysema in her sister; Heart Problems in her father, maternal grandfather, maternal grandmother, and sister; Heart attack in her mother; Heart attack (age of onset: 19) in her brother; Heart disease in her brother; Lung cancer in her sister; Narcolepsy in her child.  Wt Readings from Last 3 Encounters:  07/01/16 146 lb (66.2 kg)  11/28/14 143 lb 6.4 oz (65 kg)  04/06/14 141 lb (64 kg)    PHYSICAL EXAM BP (!) 122/56   Pulse 66   Ht 5' (1.524 m)   Wt 146 lb (66.2 kg)   BMI 28.51 kg/m  General appearance: alert, cooperative, appears stated age, no distress and Well-nourished and well-groomed. Pleasant mood and affect answers questions appropriately. HEENT: [place]/AT, EOMI, MMM, anicteric sclera Neck: no adenopathy, no carotid bruit and no JVD Lungs: CTAB,  Non-labored. Normal to percussion; good air movement Heart: RRR, non-labored, non-distendedl Abdomen: soft, non-tender; bowel sounds normal; no masses,  no organomegaly; no HJR Extremities: extremities normal, atraumatic, no cyanosis, and edema - trace in B U&LE. Pulses: 2+ and symmetric;  Skin: mobility and turgor normal, no evidence of bleeding or bruising, no lesions noted and no periungual infections or  Neurologic: Mental status: Alert, oriented, thought content appropriate Cranial nerves: normal (II-XII grossly intact)   Adult ECG Report  Rate: 67 ;  Rhythm: normal sinus rhythm and 1 AVB (PR 214). Nonspecific ST and T wave changes with T wave inversions. Stable.;   Narrative Interpretation: Overall stable EKG. 1 AVB is noted. (Was borderline last time)   Other studies Reviewed: Additional studies/ records that were reviewed today include:  Recent Labs:  Followed by PCP   ASSESSMENT / PLAN: Problem List Items Addressed This Visit    Bilateral leg edema (Chronic)    Remained stable. Not overly significant. Duct but the importance of maintaining a stable dose of Lasix but also taken an additional dose if necessary. She has been reluctant to wear support stockings in the past. I continue to recommend wearing them when she can. She has not taken any additional dose of Lasix, stating that she has been relatively comfortable.      Relevant Orders   EKG 12-Lead (Completed)   Diastolic dysfunction with chronic heart failure ([place]) - Primary (Chronic)    Was somewhat symptomatically last time and moving upper Lasix. [name] Morton is on ACE inhibitor/calcium blocker combination for afterload reduction along with there is bisoprolol. Excellent alignment For now we'll continue with current ACE inhibitor/calcium blocker dosing as well as furosemide, but will back off on her bisoprolol dose, and hopefully discontinue in order to allow some more energy level and chronotropic responsiveness..       Relevant Medications   furosemide (LASIX) 40 MG tablet   Other Relevant Orders   EKG 12-Lead (Completed)   Dyslipidemia, goal LDL below 100 (Chronic)    On simvastatin. Labs monitored by PCP      Relevant Medications   furosemide (LASIX) 40 MG tablet   Essential hypertension (Chronic)    Bisoprolol look pretty good. I suspect that they will probably increase  him with getting winded Bystolic. Hopefully this will help give her more energy and avoid any syncopal episodes.      Relevant Medications   furosemide (LASIX) 40 MG tablet   Other Relevant Orders   EKG 12-Lead (Completed)   Fatigue (Chronic)   Relevant Orders   EKG 12-Lead (Completed)      Current medicines are reviewed at length with the patient today. (+/- concerns) fatigue The following changes have been made: see below  Patient Instructions  MEDICATION CHANGES  WEANING  BISOPROLOL TAKE 1/2 TABLET  EVERY DAY FOR 2 WEEKS THEN DECREASE TO 1/2 TABLET EVERY OTHER DAY THENSTOPPING MEDICATIONS   TAKE EXTRA 1/2 TABLET OF 40 MG FUROSEMIDE (LASIX) FOR THE 2-3 DAYS THEN IF YOU HAVE INCREASE PUFFINESS MAY TAKE AN EXTRA 1/2 TABLET DAILY.    Your physician recommends that you schedule a follow-up appointment in [place] wants you to follow-up in [place].You will receive a reminder letter in the mail two months in advance. If you don't receive a letter, please call our office to schedule the follow-up appointment.   If you need a refill on your cardiac medications before your next appointment, please call your pharmacy.    Studies Ordered:   Orders Placed This Encounter  Procedures  . EKG 12-Lead      Glenetta Hew, M.D., M.S. Interventional Cardiologist   Pager # [phone] Phone # [phone] [address] [place],  [address]

## 2016-07-03 NOTE — Assessment & Plan Note (Addendum)
Bisoprolol look pretty good. I suspect that they will probably increase him with getting winded Bystolic. Hopefully this will help give her more energy and avoid any syncopal episodes.

## 2016-07-03 NOTE — Assessment & Plan Note (Addendum)
Was somewhat symptomatically last time and moving upper Lasix. He is on ACE inhibitor/calcium blocker combination for afterload reduction along with there is bisoprolol. Excellent alignment For now we'll continue with current ACE inhibitor/calcium blocker dosing as well as furosemide, but will back off on her bisoprolol dose, and hopefully discontinue in order to allow some more energy level and chronotropic responsiveness.[name] Kitchen

## 2016-07-03 NOTE — Assessment & Plan Note (Signed)
Remained stable. Not overly significant. Duct but the importance of maintaining a stable dose of Lasix but also taken an additional dose if necessary. She has been reluctant to wear support stockings in the past. I continue to recommend wearing them when she can. She has not taken any additional dose of Lasix, stating that she has been relatively comfortable.

## 2016-07-03 NOTE — Assessment & Plan Note (Signed)
On simvastatin. Labs monitored by PCP. 

## 2016-07-20 ENCOUNTER — Emergency Department (HOSPITAL_COMMUNITY)
Admission: EM | Admit: 2016-07-20 | Discharge: 2016-07-20 | Disposition: A | Discharge status: Home / Self Care | Payer: Medicare Other | Attending: Emergency Medicine | Admitting: Emergency Medicine

## 2016-07-20 ENCOUNTER — Encounter (HOSPITAL_COMMUNITY): Payer: Self-pay

## 2016-07-20 ENCOUNTER — Emergency Department (HOSPITAL_COMMUNITY): Payer: Medicare Other

## 2016-07-20 DIAGNOSIS — I5032 Chronic diastolic (congestive) heart failure: Secondary | ICD-10-CM | POA: Insufficient documentation

## 2016-07-20 DIAGNOSIS — R0602 Shortness of breath: Secondary | ICD-10-CM | POA: Insufficient documentation

## 2016-07-20 DIAGNOSIS — I110 Hypertensive heart disease with heart failure: Secondary | ICD-10-CM | POA: Diagnosis not present

## 2016-07-20 DIAGNOSIS — Z853 Personal history of malignant neoplasm of breast: Secondary | ICD-10-CM | POA: Insufficient documentation

## 2016-07-20 DIAGNOSIS — Z96652 Presence of left artificial knee joint: Secondary | ICD-10-CM | POA: Insufficient documentation

## 2016-07-20 DIAGNOSIS — Z7982 Long term (current) use of aspirin: Secondary | ICD-10-CM | POA: Insufficient documentation

## 2016-07-20 LAB — CBC WITH DIFFERENTIAL/PLATELET
Basophils Absolute: 0 10*3/uL (ref 0.0–0.1)
Basophils Relative: 0 %
EOS ABS: 0 10*3/uL (ref 0.0–0.7)
EOS PCT: 0 %
HCT: 38.2 % (ref 36.0–46.0)
HEMOGLOBIN: 13.4 g/dL (ref 12.0–15.0)
LYMPHS PCT: 26 %
Lymphs Abs: 2.1 10*3/uL (ref 0.7–4.0)
MCH: 30.9 pg (ref 26.0–34.0)
MCHC: 35.1 g/dL (ref 30.0–36.0)
MCV: 88 fL (ref 78.0–100.0)
Monocytes Absolute: 0.5 10*3/uL (ref 0.1–1.0)
Monocytes Relative: 7 %
Neutro Abs: 5.5 10*3/uL (ref 1.7–7.7)
Neutrophils Relative %: 67 %
Platelets: 251 10*3/uL (ref 150–400)
RBC: 4.34 MIL/uL (ref 3.87–5.11)
RDW: 13.6 % (ref 11.5–15.5)
WBC: 8.1 10*3/uL (ref 4.0–10.5)

## 2016-07-20 LAB — COMPREHENSIVE METABOLIC PANEL
ALT: 17 U/L (ref 14–54)
AST: 20 U/L (ref 15–41)
Albumin: 4.8 g/dL (ref 3.5–5.0)
Alkaline Phosphatase: 44 U/L (ref 38–126)
Anion gap: 12 (ref 5–15)
BILIRUBIN TOTAL: 0.5 mg/dL (ref 0.3–1.2)
BUN: 21 mg/dL — AB (ref 6–20)
CALCIUM: 9.8 mg/dL (ref 8.9–10.3)
CO2: 25 mmol/L (ref 22–32)
Chloride: 106 mmol/L (ref 101–111)
Creatinine, Ser: 0.97 mg/dL (ref 0.44–1.00)
GFR calc Af Amer: 59 mL/min — ABNORMAL LOW (ref >60)
GFR calc non Af Amer: 51 mL/min — ABNORMAL LOW (ref >60)
Glucose, Bld: 120 mg/dL — ABNORMAL HIGH (ref 65–99)
POTASSIUM: 3.8 mmol/L (ref 3.5–5.1)
Sodium: 143 mmol/L (ref 135–145)
Total Protein: 7.7 g/dL (ref 6.5–8.1)

## 2016-07-20 LAB — TROPONIN I

## 2016-07-20 LAB — BRAIN NATRIURETIC PEPTIDE: B Natriuretic Peptide: 70.4 pg/mL (ref 0.0–100.0)

## 2016-07-20 NOTE — Discharge Instructions (Signed)
As discussed, your evaluation today has been largely reassuring.  But, it is important that you monitor your condition carefully, and do not hesitate to return to the ED if you develop new, or concerning changes in your condition. ? ?Otherwise, please follow-up with your physician for appropriate ongoing care. ? ?

## 2016-07-20 NOTE — ED Provider Notes (Signed)
[facility] DEPT Provider Note   CSN: [id number] Arrival date & time: 07/20/16  1043     History   Chief Complaint Chief Complaint  Patient presents with  . Shortness of Breath    HPI [name] Morton is a 83 y.o. female.  HPI  Patient presents with concern of an episode of weakness, dyspnea. She notes that over the past few days, possibly weeks she has had a decreased exercise capacity. She has dyspnea, tightness after previously normal activities of daily living. Currently she has no complaints including no dyspnea, no chest pain, no lightheadedness, no fever. Patient has been working with her care team, including cardiology regarding this new change, and has recently been taking less Lasix, more beta blocker in an effort to improve her functionality.   Past Medical History:  Diagnosis Date  . Breast cancer ([place]) 1997   right - tx with lumpectomty & radiation  . Diverticulosis   . Dyslipidemia   . GERD (gastroesophageal reflux disease)    RARE - NO MEDICATIONS  . Hearing impairment    BILATERAL HEARING AIDS  . History of nuclear stress test 11/2009   dipyridamole; no evidence of ischemia, normal, low risk   . History of shingles 1998   NO RESIDUAL PROBLEMS  . Hypertension    SEES CARDIOLOGIST DR. DAVID HARDING FOR HYPERTENSION   . Lymphedema of arm    RIGHT   . OA (osteoarthritis)    OA AND PAIN LEFT KNEE  . Shortness of breath    WHEN CLIMBING STEPS AT MY HOME--OTHERWISE OK  . Sinus problem   . Sleep difficulties    ALWAYS HAD SLEEPING PROBLEMS - MEDICATION HELPS PT SLEEP ABOUT 3 HOURS    Patient Active Problem List   Diagnosis Date Noted  . Fatigue 07/01/2016  . Acute blood loss anemia 04/02/2014  . Colitis 04/01/2014  . Diastolic dysfunction with chronic heart failure ([place]) 04/01/2014  . GERD (gastroesophageal reflux disease) 05/02/2013  . Diverticulosis of large intestine 05/02/2013  . OA (osteoarthritis) of knee 05/01/2013  . Arm edema -  right 12/29/2012  . Bilateral leg edema 12/29/2012    Class: History of  . Essential hypertension   . Dyslipidemia, goal LDL below 100     Past Surgical History:  Procedure Laterality Date  . ABDOMINAL HYSTERECTOMY  1973  . APPENDECTOMY  1951  . BREAST ADENOMA  1950   EXCISED  . BREAST LUMPECTOMY Right 1997  . EYE SURGERY     BILATERAL CATARACT EXTRACTIONS AND LENS IMPLANTS  . REPLACEMENT TOTAL KNEE  2002  . ROTATOR CUFF REPAIR Right 12/2009   Dr. Lenna Sciara. [place]   . TOTAL KNEE ARTHROPLASTY Left 05/01/2013   Procedure: LEFT TOTAL KNEE ARTHROPLASTY;  Surgeon: Gearlean Alf, MD;  Location: [facility];  Service: Orthopedics;  Laterality: Left;  . TRANSTHORACIC ECHOCARDIOGRAM  05/1994   trace mitral/tricuspic/aortic inusff; hyperkinetic LV    OB History    No data available       Home Medications    Prior to Admission medications   Medication Sig Start Date End Date Taking? Authorizing Provider  acetaminophen (TYLENOL) 500 MG tablet Take 500 mg by mouth every 6 (six) hours as needed for moderate pain.   Yes [provider]  amLODipine-benazepril (LOTREL) 10-20 MG capsule TAKE 1 CAPSULE DAILY 01/10/16  Yes Leonie Man, MD  aspirin 81 MG tablet Take 81 mg by mouth daily.   Yes [provider]  bisoprolol (ZEBETA) 5 MG tablet  TAKE 1 TABLET DAILY Patient taking differently: TAKE 1 TABLET EVERY OTHER DAY 10/07/15  Yes Leonie Man, MD  calcium-vitamin D (OSCAL WITH D) 500-200 MG-UNIT tablet Take 1 tablet by mouth daily with breakfast.   Yes [provider]  furosemide (LASIX) 40 MG tablet TAKE 40 MG BY MOUTH DAILY AND MAY TAKE AN EXTRA 20 MG ( 1/2 TABLET) IF NEEDED FOR PUFFINESS 07/01/16  Yes Leonie Man, MD  polyethylene glycol [facility] / Floria Raveling) packet Take 17 g by mouth daily.   Yes [provider]  simvastatin (ZOCOR) 20 MG tablet Take 1 tablet (20 mg total) by mouth at bedtime. 08/16/12  Yes Leonie Man, MD  traZODone (DESYREL) 100  MG tablet Take 1 tablet by mouth at bedtime. 09/24/14  Yes [provider]    Family History Family History  Problem Relation Age of Onset  . Heart attack Mother   . Heart Problems Father   . Heart Problems Maternal Grandmother   . Heart Problems Maternal Grandfather   . Asthma Maternal Grandfather   . Heart attack Brother 42  . Heart disease Brother        CABG  . Lung cancer Sister   . Emphysema Sister   . Cancer Sister   . Heart Problems Sister        also asthma  . Narcolepsy Child   . Arthritis Child     Social History Social History  Substance Use Topics  . Smoking status: Never Smoker  . Smokeless tobacco: Never Used  . Alcohol use No     Allergies   Benadryl [diphenhydramine hcl (sleep)]; Cardizem [diltiazem hcl]; Epinephrine; Vistaril [hydroxyzine]; and Vytorin [ezetimibe-simvastatin]   Review of Systems Review of Systems  Constitutional:       Per HPI, otherwise negative  HENT:       Per HPI, otherwise negative  Respiratory:       Per HPI, otherwise negative  Cardiovascular:       Per HPI, otherwise negative  Gastrointestinal: Negative for vomiting.  Endocrine:       Negative aside from HPI  Genitourinary:       Neg aside from HPI   Musculoskeletal:       Per HPI, otherwise negative  Skin: Negative.   Neurological: Positive for weakness. Negative for syncope.     Physical Exam Updated Vital Signs BP (!) 140/57 (BP Location: Left Arm)   Pulse 92   Temp 97.8 F (36.6 C) (Oral)   Resp 18   Ht 5' (1.524 m)   Wt 66.2 kg (146 lb)   SpO2 96%   BMI 28.51 kg/m   Physical Exam  Constitutional: She is oriented to person, place, and time. She appears well-developed and well-nourished. No distress.  HENT:  Head: Normocephalic and atraumatic.  Eyes: Conjunctivae and EOM are normal.  Cardiovascular: Normal rate and regular rhythm.   Pulmonary/Chest: No stridor. No respiratory distress. She has decreased breath sounds. She has no wheezes.   Abdominal: She exhibits no distension. There is no tenderness. There is no guarding.  Musculoskeletal: She exhibits no edema.  Neurological: She is alert and oriented to person, place, and time. No cranial nerve deficit.  Skin: Skin is warm and dry.  Psychiatric: She has a normal mood and affect.  Nursing note and vitals reviewed.    ED Treatments / Results  Labs (all labs ordered are listed, but only abnormal results are displayed) Labs Reviewed  COMPREHENSIVE METABOLIC PANEL - Abnormal;  Notable for the following:       Result Value   Glucose, Bld 120 (*)    BUN 21 (*)    GFR calc non Af Amer 51 (*)    GFR calc Af Amer 59 (*)    All other components within normal limits  CBC WITH DIFFERENTIAL/PLATELET  TROPONIN I  BRAIN NATRIURETIC PEPTIDE    EKG  EKG Interpretation  Date/Time:  Monday Jul 20 2016 11:12:09 EDT Ventricular Rate:  83 PR Interval:    QRS Duration: 95 QT Interval:  350 QTC Calculation: 417 R Axis:   72 Text Interpretation:  Sinus rhythm Borderline T wave abnormalities Baseline wander in lead(s) I II aVR aVF Artifact Abnormal ekg Reconfirmed by Carmin Muskrat [phone]) on 07/20/2016 11:40:59 AM       Radiology Dg Chest 2 View  Result Date: 07/20/2016 CLINICAL DATA:  83 year old hypertensive female with shortness of breath and chest tightness. Breast cancer post lumpectomy and radiation therapy. Initial encounter. EXAM: CHEST  2 VIEW COMPARISON:  03/31/2014, 04/25/2013 and 03/27/2009. FINDINGS: Chronically elevated right hemidiaphragm. Post right breast/ axillary surgery. Heart size top-normal. Mild central pulmonary vascular prominence without pulmonary edema. No segmental consolidation, pneumothorax or plain film evidence of pulmonary malignancy. Stable biapical pleural thickening without associated bony destruction. Calcified mildly tortuous aorta. No focal thoracic spine compression fracture. IMPRESSION: No acute pulmonary abnormality.  Please see above.  Electronically Signed   By: Genia Del M.D.   On: 07/20/2016 12:10    Procedures Procedures (including critical care time)    Initial Impression / Assessment and Plan / ED Course  I have reviewed the triage vital signs and the nursing notes.  Pertinent labs & imaging results that were available during my care of the patient were reviewed by me and considered in my medical decision making (see chart for details).     3:32 PM Patient awake and alert, states that she feels okay, has ambulated to the bathroom, without complication. Pressure remains unremarkable, she is not tachycardic, tachypnea. With no tachycardia, tachypnea, hypoxia, no evidence for pulmonary embolism. No x-ray, EKG, lab evidence for ACS. Patient has multiple medical issues including CHF, and has been attempting to use different dosages of Lasix. Patient will continue to take Lasix, follow up with cardiology.   Final Clinical Impressions(s) / ED Diagnoses   Final diagnoses:  SOB (shortness of breath)     Carmin Muskrat, MD 07/20/16 [phone]

## 2016-07-20 NOTE — ED Triage Notes (Signed)
PT RECEIVED FROM HOME VIA EMS FOR SOB AND CHEST TIGHTNESS AFTER TAKING THE TRASH OUT. PT STS THIS HAS BEEN GOING ON THE LAST 3 WEEKS ON AND OFF. PT ALSO C/O TINGLING AND BURNING TO THE BOTTOM OF BOTH FEET 4 WEEKS AGO. PT STS SHE TOLD HER PCP ABOUT IT, AND SHE HAS BEEN ELEVATING HER FEET AND USING COLD COMPRESSES W/O RELIEF.

## 2016-07-31 ENCOUNTER — Ambulatory Visit (INDEPENDENT_AMBULATORY_CARE_PROVIDER_SITE_OTHER): Payer: Medicare Other | Admitting: Cardiology

## 2016-07-31 VITALS — BP 137/67 | HR 97 | Ht 60.0 in | Wt 146.4 lb

## 2016-07-31 DIAGNOSIS — I10 Essential (primary) hypertension: Secondary | ICD-10-CM | POA: Diagnosis not present

## 2016-07-31 DIAGNOSIS — I358 Other nonrheumatic aortic valve disorders: Secondary | ICD-10-CM | POA: Diagnosis not present

## 2016-07-31 DIAGNOSIS — E785 Hyperlipidemia, unspecified: Secondary | ICD-10-CM

## 2016-07-31 DIAGNOSIS — R079 Chest pain, unspecified: Secondary | ICD-10-CM

## 2016-07-31 DIAGNOSIS — I5032 Chronic diastolic (congestive) heart failure: Secondary | ICD-10-CM

## 2016-07-31 DIAGNOSIS — R0609 Other forms of dyspnea: Secondary | ICD-10-CM

## 2016-07-31 MED ORDER — BISOPROLOL FUMARATE 5 MG PO TABS: 5.0000 mg | ORAL_TABLET | Freq: Every day | ORAL | 6 refills | Status: DC

## 2016-07-31 NOTE — Patient Instructions (Signed)
SCHEDULE AT [facility] has requested that you have an echocardiogram. Echocardiography is a painless test that uses sound waves to create images of your heart. It provides your doctor with information about the size and shape of your heart and how well your heart's chambers and valves are working. This procedure takes approximately one hour. There are no restrictions for this procedure.  SCHEDULE AT [facility] has requested that you have a lexiscan myoview. For further information please visit [email or web address]. Please follow instruction sheet, as given.   Your physician recommends that you schedule a follow-up appointment in 1 MONTH WITH DR HARDING AFTER TEST.

## 2016-07-31 NOTE — Progress Notes (Signed)
PCP: Darcus Austin, MD  Clinic Note: Chief Complaint  Patient presents with  . Shortness of Breath    Loses breathe with activity.    HPI: [name] Morton is a 83 y.o. female with a PMH below who presents today for Hospital/ER follow-up. - She is followed routinely for hypertension, hyperlipidemia and edema. When I last saw her was 18 month follow-up She is a former patient of Dr. Chase Picket - had additional cardiac work for the past but had been doing well up until recently.  [name] Morton was last seen on 07/01/2016 - this was her first visit since September 2016 -> her daughter was worried about her lying around watching TV all the time and not getting out and about. She didn't note much symptoms other than some puffiness in her legs and feet. --> I recommended that she start taking her Lasix daily and take additional doses necessary. I also recommend support stockings. I backed off the bisoprolol dose thinking it may be affecting some of her fatigue level with chronotropic incompetence.  Recent Hospitalizations: ER visit 07/20/2016 - SOB/CP Lab Results  Component Value Date   CREATININE 0.97 07/20/2016   BUN 21 (H) 07/20/2016   NA 143 07/20/2016   K 3.8 07/20/2016   CL 106 07/20/2016   CO2 25 07/20/2016   Lab Results  Component Value Date    BNP  70.4 07/20/2016   TROPONINI <0.03 07/20/2016                Studies Personally Reviewed - (if available, images/films reviewed: From Epic Chart or Care Everywhere)  CXR 07/20/2016: Mild central pulmonary vascular prominence but no pulmonary edema. Stable biapical pleural thickening. Calcified mallet tortuous aorta. No acute pulmonary abnormalities noted.  Interval History: "[name]" presents today quite frustrated with her exertional dyspnea and swelling, edema. She said that she went to emergency room stated she just "could not breathe". She is not a very good historian, and it seems like this was associated with both rest  and exertion. She said that this shortness of breath was associated with a tightness in her chest. Her daughter came down and recommended that she went to the emergency room. Upon arrival to emergent, she is no longer having chest pain. There is note that she was not taking as much of it Lasix as she has been recently prescribed. She ruled out for MI. BNP level was insignificant at 70.4. Troponins were negative. She tells me that she felt better being on the bisoprolol then off. She feels more jittery and thinks that her symptoms have worsened.  No chest pain or shortness of breath with rest or exertion. No PND, orthopnea or edema. No palpitations, lightheadedness, dizziness, weakness or syncope/near syncope. No TIA/amaurosis fugax symptoms. No melena, hematochezia, hematuria, or epstaxis. No claudication.  ROS: A comprehensive was performed. Review of Systems  Constitutional: Positive for malaise/fatigue.  HENT: Negative for congestion.   Respiratory: Positive for shortness of breath.   Cardiovascular: Positive for orthopnea.  Gastrointestinal: Negative for blood in stool, heartburn and melena.  Genitourinary: Negative for hematuria.  Musculoskeletal: Positive for joint pain. Negative for falls.  Neurological: Negative for dizziness and loss of consciousness.  Endo/Heme/Allergies: Negative for environmental allergies.  Psychiatric/Behavioral: Negative for depression and memory loss. The patient is not nervous/anxious and does not have insomnia.   All other systems reviewed and are negative.  I have reviewed and (if needed) personally updated the patient's problem list, medications, allergies, past medical  and surgical history, social and family history.   Past Medical History:  Diagnosis Date  . Breast cancer ([place]) 1997   right - tx with lumpectomty & radiation  . Diverticulosis   . Dyslipidemia   . GERD (gastroesophageal reflux disease)    RARE - NO MEDICATIONS  . Hearing  impairment    BILATERAL HEARING AIDS  . History of nuclear stress test 11/2009   dipyridamole; no evidence of ischemia, normal, low risk   . History of shingles 1998   NO RESIDUAL PROBLEMS  . Hypertension    SEES CARDIOLOGIST DR. DAVID HARDING FOR HYPERTENSION   . Lymphedema of arm    RIGHT   . OA (osteoarthritis)    OA AND PAIN LEFT KNEE  . Shortness of breath    WHEN CLIMBING STEPS AT MY HOME--OTHERWISE OK  . Sinus problem   . Sleep difficulties    ALWAYS HAD SLEEPING PROBLEMS - MEDICATION HELPS PT SLEEP ABOUT 3 HOURS    Past Surgical History:  Procedure Laterality Date  . ABDOMINAL HYSTERECTOMY  1973  . APPENDECTOMY  1951  . BREAST ADENOMA  1950   EXCISED  . BREAST LUMPECTOMY Right 1997  . EYE SURGERY     BILATERAL CATARACT EXTRACTIONS AND LENS IMPLANTS  . REPLACEMENT TOTAL KNEE  2002  . ROTATOR CUFF REPAIR Right 12/2009   Dr. Lenna Sciara. [place]   . TOTAL KNEE ARTHROPLASTY Left 05/01/2013   Procedure: LEFT TOTAL KNEE ARTHROPLASTY;  Surgeon: Gearlean Alf, MD;  Location: [facility];  Service: Orthopedics;  Laterality: Left;  . TRANSTHORACIC ECHOCARDIOGRAM  05/1994   trace mitral/tricuspic/aortic inusff; hyperkinetic LV    Current Meds  Medication Sig  . acetaminophen (TYLENOL) 500 MG tablet Take 500 mg by mouth every 6 (six) hours as needed for moderate pain.  [name] Kitchen amLODipine-benazepril (LOTREL) 10-20 MG capsule TAKE 1 CAPSULE DAILY  . aspirin 81 MG tablet Take 81 mg by mouth daily.  . bisoprolol (ZEBETA) 5 MG tablet Take 1 tablet (5 mg total) by mouth daily.  . calcium-vitamin D (OSCAL WITH D) 500-200 MG-UNIT tablet Take 1 tablet by mouth daily with breakfast.  . furosemide (LASIX) 40 MG tablet TAKE 40 MG BY MOUTH DAILY AND MAY TAKE AN EXTRA 20 MG ( 1/2 TABLET) IF NEEDED FOR PUFFINESS  . polyethylene glycol (MIRALAX / GLYCOLAX) packet Take 17 g by mouth daily.  . simvastatin (ZOCOR) 20 MG tablet Take 1 tablet (20 mg total) by mouth at bedtime.  . traZODone (DESYREL) 100 MG tablet  Take 1 tablet by mouth at bedtime.  . [DISCONTINUED] bisoprolol (ZEBETA) 5 MG tablet TAKE 1 TABLET DAILY (Patient taking differently: TAKE 1 TABLET EVERY OTHER DAY)    Allergies  Allergen Reactions  . Benadryl [Diphenhydramine Hcl (Sleep)]     Makes me hyper   . Cardizem [Diltiazem Hcl]     Doesn't remember. Dr Rex Kras told patient not to take.   [name] Kitchen Epinephrine     unknown  . Vistaril [Hydroxyzine]     unknown  . Vytorin [Ezetimibe-Simvastatin]     Not comfortable taking it. Doesn't remember reaction.     Social History   Social History  . Marital status: Widowed    Spouse name: N/A  . Number of children: 2  . Years of education: N/A   Occupational History  . retired Therapist, sports    Social History Main Topics  . Smoking status: Never Smoker  . Smokeless tobacco: Never Used  . Alcohol use No  . Drug  use: No  . Sexual activity: Not Asked   Other Topics Concern  . None   Social History Narrative   Widowed since 11/2010.  Mother of 2.  No real exercise due to knee.   Smoki: No   EtOH: NO    family history includes Arthritis in her child; Asthma in her maternal grandfather; Cancer in her sister; Emphysema in her sister; Heart Problems in her father, maternal grandfather, maternal grandmother, and sister; Heart attack in her mother; Heart attack (age of onset: 52) in her brother; Heart disease in her brother; Lung cancer in her sister; Narcolepsy in her child.  Wt Readings from Last 3 Encounters:  07/31/16 146 lb 6.4 oz (66.4 kg)  07/20/16 146 lb (66.2 kg)  07/01/16 146 lb (66.2 kg)    PHYSICAL EXAM BP 137/67   Pulse 97   Ht 5' (1.524 m)   Wt 146 lb 6.4 oz (66.4 kg)   BMI 28.59 kg/m  General appearance: alert, cooperative, appears stated age, no distress. Well-nourished and well-groomed. Overweight, but not obese HEENT: [place]/AT, EOMI, MMM, anicteric sclera Neck: no adenopathy, no carotid bruit and no JVD Lungs: clear to auscultation bilaterally, normal percussion bilaterally  and non-labored Heart: regular rate and rhythm, S1 &S2 normal, no rubs or gallops. Nondisplaced PMI. Harsh 2-3/6 SEM at RUSB. Abdomen: soft, non-tender; bowel sounds normal; no masses,  no organomegaly; no HJR Extremities: extremities normal, atraumatic, no cyanosis. edema trace-1+ Pulses: 2+ and symmetric;  Skin: mobility and turgor normal, no evidence of bleeding or bruising, no lesions noted, temperature normal and texture normal Neurologic: Mental status: Alert & oriented x 3, thought content appropriate; non-focal exam.  Pleasant mood & affect.   Adult ECG Report N/A  Other studies Reviewed: Additional studies/ records that were reviewed today include:  Recent Labs:  Lipids monitored by PCP  ASSESSMENT / PLAN: Problem List Items Addressed This Visit    Aortic systolic murmur on examination (Chronic)    As we are now looking for reasons for exertional dyspnea, I realize she has not had an echocardiogram checked in a long time. We'll check a 2-D echo to assess for probable aortic valve disease. This will also help evaluate her dyspnea.      Relevant Orders   ECHOCARDIOGRAM COMPLETE   Myocardial Perfusion Imaging   Diastolic dysfunction with chronic heart failure ([place]) (Chronic)    More somatic now than previously. Stop beta blocker, that may be the reason for her symptoms. We'll restart now. She is also on amlodipine-ACE inhibitor combination. We have instructed her take additional dose of Lasix and I reiterated the importance of doing that.  PLAN: Assess LV systolic and diastolic function as well as valvular disease with echocardiogram.      Relevant Medications   bisoprolol (ZEBETA) 5 MG tablet   Other Relevant Orders   ECHOCARDIOGRAM COMPLETE   Myocardial Perfusion Imaging   Dyslipidemia, goal LDL below 100 (Chronic)    On statin. Labs monitored by PCP.      Relevant Medications   bisoprolol (ZEBETA) 5 MG tablet   Essential hypertension (Chronic)    The pressures look  pretty well controlled, however she would like to be back on bisoprolol. I just recommend that she restart at the previous dose.      Relevant Medications   bisoprolol (ZEBETA) 5 MG tablet   Other Relevant Orders   ECHOCARDIOGRAM COMPLETE   Myocardial Perfusion Imaging   Exertional chest tightness - Primary    Hard to  tell this is related to dyspnea from diastolic dysfunction versus angina.   Plan: Evaluate with 2-D echocardiogram and scan Myoview.      Exertional dyspnea (Chronic)    [place] this is due to diastolic dysfunction versus an anginal equivalent. Plan: 2-D echocardiogram and Lexiscan Myoview.      Relevant Orders   ECHOCARDIOGRAM COMPLETE   Myocardial Perfusion Imaging      Current medicines are reviewed at length with the patient today. (+/- concerns) wants to restart Bisoprolol. The following changes have been made: restart bisoprolol   Patient Instructions  SCHEDULE AT [facility] has requested that you have an echocardiogram. Echocardiography is a painless test that uses sound waves to create images of your heart. It provides your doctor with information about the size and shape of your heart and how well your heart's chambers and valves are working. This procedure takes approximately one hour. There are no restrictions for this procedure.  SCHEDULE AT [facility] has requested that you have a lexiscan myoview. For further information please visit [email or web address]. Please follow instruction sheet, as given.   Your physician recommends that you schedule a follow-up appointment in 1 MONTH WITH DR HARDING AFTER TEST.     Studies Ordered:   Orders Placed This Encounter  Procedures  . Myocardial Perfusion Imaging  . ECHOCARDIOGRAM COMPLETE      Glenetta Hew, M.D., M.S. Interventional Cardiologist   Pager # [phone] Phone # [phone] [address] [place], [place] [address]

## 2016-08-02 ENCOUNTER — Encounter: Payer: Self-pay | Admitting: Cardiology

## 2016-08-02 DIAGNOSIS — R079 Chest pain, unspecified: Secondary | ICD-10-CM | POA: Insufficient documentation

## 2016-08-02 NOTE — Assessment & Plan Note (Signed)
[place] this is due to diastolic dysfunction versus an anginal equivalent. Plan: 2-D echocardiogram and Lexiscan Myoview.

## 2016-08-02 NOTE — Assessment & Plan Note (Signed)
On statin. Labs monitored by PCP.

## 2016-08-02 NOTE — Assessment & Plan Note (Signed)
Hard to tell this is related to dyspnea from diastolic dysfunction versus angina.   Plan: Evaluate with 2-D echocardiogram and scan Myoview.

## 2016-08-02 NOTE — Assessment & Plan Note (Signed)
The pressures look pretty well controlled, however she would like to be back on bisoprolol. I just recommend that she restart at the previous dose.

## 2016-08-02 NOTE — Assessment & Plan Note (Signed)
As we are now looking for reasons for exertional dyspnea, I realize she has not had an echocardiogram checked in a long time. We'll check a 2-D echo to assess for probable aortic valve disease. This will also help evaluate her dyspnea.

## 2016-08-02 NOTE — Assessment & Plan Note (Signed)
More somatic now than previously. Stop beta blocker, that may be the reason for her symptoms. We'll restart now. She is also on amlodipine-ACE inhibitor combination. We have instructed her take additional dose of Lasix and I reiterated the importance of doing that.  PLAN: Assess LV systolic and diastolic function as well as valvular disease with echocardiogram.

## 2016-08-03 ENCOUNTER — Telehealth: Payer: Self-pay | Admitting: Cardiology

## 2016-08-03 NOTE — Telephone Encounter (Signed)
Called patient and LVM to call back to schedule echo, stress test and followup.

## 2016-08-30 HISTORY — PX: TRANSTHORACIC ECHOCARDIOGRAM: SHX275

## 2016-08-30 HISTORY — PX: NM MYOVIEW LTD: HXRAD82

## 2016-09-01 ENCOUNTER — Other Ambulatory Visit: Payer: Self-pay | Admitting: *Deleted

## 2016-09-01 MED ORDER — BISOPROLOL FUMARATE 5 MG PO TABS: 5.0000 mg | ORAL_TABLET | Freq: Every day | ORAL | 0 refills | Status: DC

## 2016-09-01 NOTE — Telephone Encounter (Signed)
Patient left a msg on the refill vm stating that she is out of bisoprolol. She would like a [facility] and ask that they get this to her right away.

## 2016-09-10 ENCOUNTER — Telehealth (HOSPITAL_COMMUNITY): Payer: Self-pay

## 2016-09-10 ENCOUNTER — Other Ambulatory Visit: Payer: Self-pay

## 2016-09-10 ENCOUNTER — Ambulatory Visit (HOSPITAL_COMMUNITY): Payer: Medicare Other | Attending: Cardiology

## 2016-09-10 DIAGNOSIS — I080 Rheumatic disorders of both mitral and aortic valves: Secondary | ICD-10-CM | POA: Diagnosis not present

## 2016-09-10 DIAGNOSIS — I5032 Chronic diastolic (congestive) heart failure: Secondary | ICD-10-CM

## 2016-09-10 DIAGNOSIS — E785 Hyperlipidemia, unspecified: Secondary | ICD-10-CM | POA: Insufficient documentation

## 2016-09-10 DIAGNOSIS — R0609 Other forms of dyspnea: Secondary | ICD-10-CM | POA: Insufficient documentation

## 2016-09-10 DIAGNOSIS — R011 Cardiac murmur, unspecified: Secondary | ICD-10-CM | POA: Diagnosis not present

## 2016-09-10 DIAGNOSIS — I110 Hypertensive heart disease with heart failure: Secondary | ICD-10-CM | POA: Insufficient documentation

## 2016-09-10 DIAGNOSIS — I10 Essential (primary) hypertension: Secondary | ICD-10-CM | POA: Diagnosis not present

## 2016-09-10 DIAGNOSIS — I358 Other nonrheumatic aortic valve disorders: Secondary | ICD-10-CM | POA: Insufficient documentation

## 2016-09-10 DIAGNOSIS — I313 Pericardial effusion (noninflammatory): Secondary | ICD-10-CM | POA: Insufficient documentation

## 2016-09-10 NOTE — Telephone Encounter (Signed)
Encounter complete. 

## 2016-09-15 ENCOUNTER — Ambulatory Visit (HOSPITAL_COMMUNITY)
Admission: RE | Admit: 2016-09-15 | Discharge: 2016-09-15 | Disposition: A | Discharge status: Home / Self Care | Payer: Medicare Other | Source: Ambulatory Visit | Attending: Cardiovascular Disease | Admitting: Cardiovascular Disease

## 2016-09-15 DIAGNOSIS — R0609 Other forms of dyspnea: Secondary | ICD-10-CM | POA: Diagnosis not present

## 2016-09-15 DIAGNOSIS — I10 Essential (primary) hypertension: Secondary | ICD-10-CM | POA: Diagnosis not present

## 2016-09-15 DIAGNOSIS — I358 Other nonrheumatic aortic valve disorders: Secondary | ICD-10-CM | POA: Insufficient documentation

## 2016-09-15 DIAGNOSIS — I5032 Chronic diastolic (congestive) heart failure: Secondary | ICD-10-CM | POA: Diagnosis not present

## 2016-09-15 LAB — MYOCARDIAL PERFUSION IMAGING
CHL CUP NUCLEAR SSS: 13
CSEPPHR: 64 {beats}/min
LV dias vol: 52 mL (ref 46–106)
LV sys vol: 13 mL
NUC STRESS TID: 1.72
Rest HR: 53 {beats}/min
SDS: 4
SRS: 9

## 2016-09-15 MED ORDER — REGADENOSON 0.4 MG/5ML IV SOLN
0.4000 mg | Freq: Once | INTRAVENOUS | Status: AC
  Administered 2016-09-15: 0.4 mg via INTRAVENOUS

## 2016-09-15 MED ORDER — TECHNETIUM TC 99M TETROFOSMIN IV KIT
10.7000 | PACK | Freq: Once | INTRAVENOUS | Status: AC | PRN
  Administered 2016-09-15: 10.7 via INTRAVENOUS
  Filled 2016-09-15: qty 11

## 2016-09-15 MED ORDER — TECHNETIUM TC 99M TETROFOSMIN IV KIT
30.1000 | PACK | Freq: Once | INTRAVENOUS | Status: AC | PRN
  Administered 2016-09-15: 30.1 via INTRAVENOUS
  Filled 2016-09-15: qty 31

## 2016-09-16 NOTE — Progress Notes (Signed)
Stress Test looked good!! No sign of significant Heart Artery Disease.  Pump function is normal.  Good news!!.  [name] Morton W, MD 

## 2016-10-07 ENCOUNTER — Encounter: Payer: Self-pay | Admitting: Cardiology

## 2016-10-07 ENCOUNTER — Ambulatory Visit (INDEPENDENT_AMBULATORY_CARE_PROVIDER_SITE_OTHER): Payer: Medicare Other | Admitting: Cardiology

## 2016-10-07 VITALS — BP 146/65 | HR 69 | Ht 60.0 in | Wt 146.8 lb

## 2016-10-07 DIAGNOSIS — I10 Essential (primary) hypertension: Secondary | ICD-10-CM | POA: Diagnosis not present

## 2016-10-07 DIAGNOSIS — R0609 Other forms of dyspnea: Secondary | ICD-10-CM

## 2016-10-07 DIAGNOSIS — R079 Chest pain, unspecified: Secondary | ICD-10-CM

## 2016-10-07 DIAGNOSIS — I5032 Chronic diastolic (congestive) heart failure: Secondary | ICD-10-CM

## 2016-10-07 DIAGNOSIS — R55 Syncope and collapse: Secondary | ICD-10-CM

## 2016-10-07 NOTE — Patient Instructions (Addendum)
NO CHANGE WITH MEDICATIONS      Your physician wants you to follow-up in [place].You will receive a reminder letter in the mail two months in advance. If you don't receive a letter, please call our office to schedule the follow-up appointment.    If you need a refill on your cardiac medications before your next appointment, please call your pharmacy.

## 2016-10-07 NOTE — Progress Notes (Signed)
PCP: [name] Austin, MD  Clinic Note: Chief Complaint  Patient presents with  . Follow-up    Exertional dyspnea    HPI: [name] Morton is a 83 y.o. female with a PMH below who presents today for 2 month follow-up for episodic chest pain dyspnea / exercise intolerance  She is a former patient of Dr. Chase Morton who has been following up for several years but has never had a abnormal cardiac study. Simply mild aortic stenosis apparently, she has had chronic syncopal episodes that have not been explained.  [name] Morton was last seen on July 31, 2016 - noted Chest tightness & dyspnea  Recent Hospitalizations: None  Studies Personally Reviewed - (if available, images/films reviewed: From Epic Chart or Care Everywhere)  2-D echo 09/10/2016: Normal LV size. EF 65-70%. GR 1 DD - normal for age.[name] Kitchen MIld Aortic stenosis.  Myoview stress test 09/15/2016: Hyperdynamic LV EF 65%. No EKG Changes noted. No ischemia or infarction. LOW RISK.  Interval History: "[name] Morton" returns today to follow-up from her studies. Says that she gets short of breath when she is out in the yard working and has to stop every noun and catch her breath. Her daughter seems to think that she's probably just doing too much. She has only had one "passout spell "in the last couple months when she had been cleaning her bathroom and she simply passed out walking back to the room. Usually this happens when she's been doing something really strenuous. She doesn't think that she is usually out for very long. A lot of her activities limited to back pain and knee pain. She denies any PND, orthopnea and has mild edema. She denies any chest pressure with exertion causes exertional dyspnea. She denies any orthostatic symptoms.  She denies a rapid irregular heartbeats or palpitations. No TIA/amaurosis fugax symptoms. No melena, hematochezia, hematuria, or epstaxis. No claudication.  ROS: A comprehensive was performed. Review of  Systems  Constitutional: Negative for malaise/fatigue (Exercise intolerance).  HENT: Negative for nosebleeds.   Respiratory: Positive for shortness of breath (With exertion).   Cardiovascular: Positive for orthopnea (Very rarely).  Gastrointestinal: Negative for abdominal pain, blood in stool and melena.  Genitourinary: Negative for frequency, hematuria and urgency.  Musculoskeletal: Positive for back pain and joint pain.  Neurological: Positive for dizziness and loss of consciousness (1 episode.).  All other systems reviewed and are negative.  I have reviewed and (if needed) personally updated the patient's problem list, medications, allergies, past medical and surgical history, social and family history.   Past Medical History:  Diagnosis Date  . Breast cancer ([place]) 1997   right - tx with lumpectomty & radiation  . Diverticulosis   . Dyslipidemia   . GERD (gastroesophageal reflux disease)    RARE - NO MEDICATIONS  . Hearing impairment    BILATERAL HEARING AIDS  . History of nuclear stress test 11/2009   dipyridamole; no evidence of ischemia, normal, low risk   . History of shingles 1998   NO RESIDUAL PROBLEMS  . Hypertension    SEES CARDIOLOGIST DR. Gracen Morton FOR HYPERTENSION   . Lymphedema of arm    RIGHT   . OA (osteoarthritis)    OA AND PAIN LEFT KNEE  . Shortness of breath    WHEN CLIMBING STEPS AT MY HOME--OTHERWISE OK  . Sinus problem   . Sleep difficulties    ALWAYS HAD SLEEPING PROBLEMS - MEDICATION HELPS PT SLEEP ABOUT 3 HOURS    Past Surgical History:  Procedure Laterality Date  . ABDOMINAL HYSTERECTOMY  1973  . APPENDECTOMY  1951  . BREAST ADENOMA  1950   EXCISED  . BREAST LUMPECTOMY Right 1997  . EYE SURGERY     BILATERAL CATARACT EXTRACTIONS AND LENS IMPLANTS  . NM MYOVIEW LTD  08/2016   Hyperdynamic LV function EF 65%. No ischemia or infarction. LOW RISK  . REPLACEMENT TOTAL KNEE  2002  . ROTATOR CUFF REPAIR Right 12/2009   Dr. Lenna Morton. [place]     . TOTAL KNEE ARTHROPLASTY Left 05/01/2013   Procedure: LEFT TOTAL KNEE ARTHROPLASTY;  Surgeon: [name] Alf, MD;  Location: [facility];  Service: Orthopedics;  Laterality: Left;  . TRANSTHORACIC ECHOCARDIOGRAM  05/1994   trace mitral/tricuspic/aortic inusff; hyperkinetic LV  . TRANSTHORACIC ECHOCARDIOGRAM  08/2016   Normal LV size. EF 65-70%. GR 1 DD. (Normal for age). Mild aortic stenosis    Current Meds  Medication Sig  . acetaminophen (TYLENOL) 500 MG tablet Take 500 mg by mouth every 6 (six) hours as needed for moderate pain.  [name] Kitchen amLODipine-benazepril (LOTREL) 10-20 MG capsule TAKE 1 CAPSULE DAILY  . aspirin 81 MG tablet Take 81 mg by mouth daily.  . bisoprolol (ZEBETA) 5 MG tablet Take 1 tablet (5 mg total) by mouth daily.  . calcium-vitamin D (OSCAL WITH D) 500-200 MG-UNIT tablet Take 1 tablet by mouth daily with breakfast.  . furosemide (LASIX) 40 MG tablet TAKE 40 MG BY MOUTH DAILY AND MAY TAKE AN EXTRA 20 MG ( 1/2 TABLET) IF NEEDED FOR PUFFINESS  . polyethylene glycol (MIRALAX / GLYCOLAX) packet Take 17 g by mouth daily.  . simvastatin (ZOCOR) 20 MG tablet Take 1 tablet (20 mg total) by mouth at bedtime.  . traZODone (DESYREL) 100 MG tablet Take 1 tablet by mouth at bedtime.    Allergies  Allergen Reactions  . Benadryl [Diphenhydramine Hcl (Sleep)]     Makes me hyper   . Cardizem [Diltiazem Hcl]     Doesn't remember. Dr [name] Morton told patient not to take.   [name] Kitchen Epinephrine     unknown  . Vistaril [Hydroxyzine]     unknown  . Vytorin [Ezetimibe-Simvastatin]     Not comfortable taking it. Doesn't remember reaction.     Social History   Social History  . Marital status: Widowed    Spouse name: N/A  . Number of children: 2  . Years of education: N/A   Occupational History  . retired Therapist, sports    Social History Main Topics  . Smoking status: Never Smoker  . Smokeless tobacco: Never Used  . Alcohol use No  . Drug use: No  . Sexual activity: Not Asked   Other Topics Concern   . None   Social History Narrative   Widowed since 11/2010.  Mother of 2.  No real exercise due to knee.   Smoki: No   EtOH: NO    family history includes Arthritis in her child; Asthma in her maternal grandfather; Cancer in her sister; Emphysema in her sister; Heart Problems in her father, maternal grandfather, maternal grandmother, and sister; Heart attack in her mother; Heart attack (age of onset: 95) in her brother; Heart disease in her brother; Lung cancer in her sister; Narcolepsy in her child.  Wt Readings from Last 3 Encounters:  10/07/16 146 lb 12.8 oz (66.6 kg)  09/15/16 146 lb (66.2 kg)  07/31/16 146 lb 6.4 oz (66.4 kg)    PHYSICAL EXAM BP (!) 146/65   Pulse 69   Ht  5' (1.524 m)   Wt 146 lb 12.8 oz (66.6 kg)   BMI 28.67 kg/m  Physical Exam  Constitutional: She appears well-developed and well-nourished. No distress.  HENT:  Head: Normocephalic.  Neck: Normal range of motion. No hepatojugular reflux and no JVD present. Carotid bruit is not present.  Cardiovascular: Normal rate, regular rhythm and intact distal pulses.  Exam reveals no gallop and no friction rub.   Murmur heard.  Medium-pitched harsh crescendo-decrescendo early systolic murmur is present with a grade of 2/6  at the upper right sternal border Pulmonary/Chest: Effort normal and breath sounds normal. No respiratory distress. She has no wheezes.  Abdominal: Soft. Bowel sounds are normal. She exhibits no distension.  Neurological: She is alert.  Skin: Skin is warm and dry. No rash noted. No erythema.  Psychiatric: She has a normal mood and affect. Her behavior is normal. Judgment and thought content normal.  Nursing note and vitals reviewed.   Adult ECG Report n/a  Other studies Reviewed: Additional studies/ records that were reviewed today include:  Recent Labs:   Lab Results  Component Value Date   CREATININE 0.97 07/20/2016   BUN 21 (H) 07/20/2016   NA 143 07/20/2016   K 3.8 07/20/2016   CL 106  07/20/2016   CO2 25 07/20/2016   No results found for: CHOL, HDL, LDLCALC, LDLDIRECT, TRIG, CHOLHDL   ASSESSMENT / PLAN: Problem List Items Addressed This Visit    Diastolic dysfunction with chronic heart failure ([place]) (Chronic)    She has exertional dyspnea which is probably multifactorial. Diastolic dysfunction in a patient her age is really stable for age. Plan is to just have her back off on some of her activity. Continue blood pressure control. I would not over diuresis however. She is on amlodipine and benazepril for blood pressure/ocular production. She also on low-dose of Zebeta. She takes her furosemide standing and has not had any additional doses.  Continue to be active, but is not "overdoing" it      Essential hypertension (Chronic)    Blood pressure is borderline elevated today, I'm reluctant to push it too much because she's had these pass out spells that I cannot explain.      Exertional chest tightness    Likely nonischemic with essentially negative Myoview. Could be related to dyspnea.      Exertional dyspnea - Primary (Chronic)   Syncope and collapse (Chronic)    This is the first time occurred about any of these episodes. She seems not mentioned, her daughter says that they've been going on ever since she can remember. They are very short-lived and not been overly concerning to either one of them. They do not sound arrhythmogenic.  At this point unless she sees more concern, further evaluate.         Current medicines are reviewed at length with the patient today. (+/- concerns) n/a The following changes have been made: n/a  Patient Instructions  NO CHANGE WITH MEDICATIONS      Your physician wants you to follow-up in [place].You will receive a reminder letter in the mail two months in advance. If you don't receive a letter, please call our office to schedule the follow-up appointment.    If you need a refill on your cardiac  medications before your next appointment, please call your pharmacy.    Studies Ordered:   No orders of the defined types were placed in this encounter.     Glenetta Hew,  M.D., M.S. Interventional Cardiologist   Pager # [phone] Phone # [phone] [address] [place], [place] [address]

## 2016-10-09 ENCOUNTER — Encounter: Payer: Self-pay | Admitting: Cardiology

## 2016-10-09 DIAGNOSIS — R55 Syncope and collapse: Secondary | ICD-10-CM | POA: Insufficient documentation

## 2016-10-09 NOTE — Assessment & Plan Note (Signed)
Likely nonischemic with essentially negative Myoview. Could be related to dyspnea.

## 2016-10-09 NOTE — Assessment & Plan Note (Signed)
This is the first time occurred about any of these episodes. She seems not mentioned, her daughter says that they've been going on ever since she can remember. They are very short-lived and not been overly concerning to either one of them. They do not sound arrhythmogenic.  At this point unless she sees more concern, further evaluate.

## 2016-10-09 NOTE — Assessment & Plan Note (Signed)
She has exertional dyspnea which is probably multifactorial. Diastolic dysfunction in a patient her age is really stable for age. Plan is to just have her back off on some of her activity. Continue blood pressure control. I would not over diuresis however. She is on amlodipine and benazepril for blood pressure/ocular production. She also on low-dose of Zebeta. She takes her furosemide standing and has not had any additional doses.  Continue to be active, but is not "overdoing" it

## 2016-10-09 NOTE — Assessment & Plan Note (Signed)
Blood pressure is borderline elevated today, I'm reluctant to push it too much because she's had these pass out spells that I cannot explain.

## 2016-11-23 DIAGNOSIS — Z23 Encounter for immunization: Secondary | ICD-10-CM | POA: Diagnosis not present

## 2016-12-25 ENCOUNTER — Telehealth: Payer: Self-pay | Admitting: Cardiology

## 2016-12-25 MED ORDER — BISOPROLOL FUMARATE 5 MG PO TABS: 5.0000 mg | ORAL_TABLET | Freq: Every day | ORAL | 1 refills | Status: DC

## 2016-12-25 NOTE — Telephone Encounter (Signed)
°  New Prob   *STAT* If patient is at the pharmacy, call can be transferred to refill team.   1. Which medications need to be refilled? (please list name of each medication and dose if known)  Bisoprolol 5 mg once daily  2. Which pharmacy/location (including street and city if local pharmacy) is medication to be sent to?  [facility]  3. Do they need a 30 day or 90 day supply? 90- Requesting for 6 months to 1 year supply worth of refills.

## 2017-05-05 DIAGNOSIS — R413 Other amnesia: Secondary | ICD-10-CM | POA: Diagnosis not present

## 2017-05-05 DIAGNOSIS — I10 Essential (primary) hypertension: Secondary | ICD-10-CM | POA: Diagnosis not present

## 2017-05-05 DIAGNOSIS — E7800 Pure hypercholesterolemia, unspecified: Secondary | ICD-10-CM | POA: Diagnosis not present

## 2017-05-05 DIAGNOSIS — G4700 Insomnia, unspecified: Secondary | ICD-10-CM | POA: Diagnosis not present

## 2017-05-12 DIAGNOSIS — I10 Essential (primary) hypertension: Secondary | ICD-10-CM | POA: Diagnosis not present

## 2017-05-12 DIAGNOSIS — R413 Other amnesia: Secondary | ICD-10-CM | POA: Diagnosis not present

## 2017-05-18 ENCOUNTER — Other Ambulatory Visit: Payer: Self-pay | Admitting: Family Medicine

## 2017-05-18 DIAGNOSIS — F0390 Unspecified dementia without behavioral disturbance: Secondary | ICD-10-CM

## 2017-05-18 DIAGNOSIS — R413 Other amnesia: Secondary | ICD-10-CM

## 2017-05-19 DIAGNOSIS — N183 Chronic kidney disease, stage 3 (moderate): Secondary | ICD-10-CM | POA: Diagnosis not present

## 2017-05-19 DIAGNOSIS — G4700 Insomnia, unspecified: Secondary | ICD-10-CM | POA: Diagnosis not present

## 2017-05-19 DIAGNOSIS — I890 Lymphedema, not elsewhere classified: Secondary | ICD-10-CM | POA: Diagnosis not present

## 2017-05-19 DIAGNOSIS — G3184 Mild cognitive impairment, so stated: Secondary | ICD-10-CM | POA: Diagnosis not present

## 2017-05-19 DIAGNOSIS — Z0000 Encounter for general adult medical examination without abnormal findings: Secondary | ICD-10-CM | POA: Diagnosis not present

## 2017-05-19 DIAGNOSIS — R4181 Age-related cognitive decline: Secondary | ICD-10-CM | POA: Diagnosis not present

## 2017-05-19 DIAGNOSIS — E7800 Pure hypercholesterolemia, unspecified: Secondary | ICD-10-CM | POA: Diagnosis not present

## 2017-05-19 DIAGNOSIS — R269 Unspecified abnormalities of gait and mobility: Secondary | ICD-10-CM | POA: Diagnosis not present

## 2017-05-19 DIAGNOSIS — I10 Essential (primary) hypertension: Secondary | ICD-10-CM | POA: Diagnosis not present

## 2017-05-25 ENCOUNTER — Ambulatory Visit
Admission: RE | Admit: 2017-05-25 | Discharge: 2017-05-25 | Disposition: A | Discharge status: Home / Self Care | Payer: Medicare Other | Source: Ambulatory Visit | Attending: Family Medicine | Admitting: Family Medicine

## 2017-05-25 DIAGNOSIS — R413 Other amnesia: Secondary | ICD-10-CM

## 2017-05-25 DIAGNOSIS — F0390 Unspecified dementia without behavioral disturbance: Secondary | ICD-10-CM | POA: Diagnosis not present

## 2017-05-25 MED ORDER — GADOBENATE DIMEGLUMINE 529 MG/ML IV SOLN
14.0000 mL | Freq: Once | INTRAVENOUS | Status: AC | PRN
  Administered 2017-05-25: 14 mL via INTRAVENOUS

## 2017-06-02 ENCOUNTER — Telehealth: Payer: Self-pay | Admitting: Cardiology

## 2017-06-02 NOTE — Telephone Encounter (Signed)
Spoke daughter  Informed per patient 's record / she should be taking 81 mg aspirin.  DAUGHTER STATES SHE WANTED TO KNOW IF PATIENT STILL NEEDS TAKE THE 81 MG .  DAUGHTER AWARE WIL DEFER TO DR HARDING  AND CALL HER BACK

## 2017-06-02 NOTE — Telephone Encounter (Signed)
New Message   Pt c/o medication issue:  1. Name of Medication: aspirin  2. How are you currently taking this medication (dosage and times per day)? 325 mg once a day  3. Are you having a reaction (difficulty breathing--STAT)? no  4. What is your medication issue? Pt's daughter wants to make sure its ok for her to be taking such a high dosage or if she she take low dose if any at all. Please call-

## 2017-06-03 NOTE — Telephone Encounter (Signed)
Spoke to daughter , aware can stop aspirin 81 mg

## 2017-06-03 NOTE — Telephone Encounter (Signed)
She really does not need to take any ASA.  Glenetta Hew, MD

## 2017-06-07 ENCOUNTER — Encounter: Payer: Self-pay | Admitting: Neurology

## 2017-06-11 ENCOUNTER — Telehealth: Payer: Self-pay | Admitting: Cardiology

## 2017-06-11 NOTE — Telephone Encounter (Signed)
Pt c/o swelling: STAT is pt has developed SOB within 24 hours  How much weight have you gained and in what time span? unknown 1) If swelling, where is the swelling located? Both ankles, legs looks slightly darker   2) Are you currently taking a fluid pill? yes    3) Are you currently SOB? Yes but she is always sob, Nothing abnormal   Do you have a log of your daily weights (if so, list)? unknown Have you gained 3 pounds in a day or 5 pounds in a week? Steady weight  4) Have you traveled recently? No   Daughter worried that she may have a blood clot in her right leg

## 2017-06-11 NOTE — Telephone Encounter (Signed)
Returned call to daughter. She reports patient has been having in both ankles and swelling. She was "hollering" as soon as she woke up this AM d/t ankle pain. This is the first day patient has complained. Daughter said patient said that patient told her that it felt like something was "moving around and trying to get out [of her leg]". The pain has resolved but she is unsteady on her feet. Daughter plans to give patient additional lasix 20mg  PRN - advised to do this for 2-3 days and call back if symptoms have not resolved.   Routed to MD as Juluis Rainier

## 2017-06-13 NOTE — Telephone Encounter (Signed)
Good plan -- lets make sure that she is using support hose. Glenetta Hew, MD

## 2017-06-14 ENCOUNTER — Ambulatory Visit: Payer: Self-pay | Admitting: Adult Health

## 2017-07-13 DIAGNOSIS — M545 Low back pain: Secondary | ICD-10-CM | POA: Diagnosis not present

## 2017-07-13 DIAGNOSIS — M5136 Other intervertebral disc degeneration, lumbar region: Secondary | ICD-10-CM | POA: Diagnosis not present

## 2017-07-20 ENCOUNTER — Ambulatory Visit: Payer: Self-pay | Admitting: Internal Medicine

## 2017-07-30 ENCOUNTER — Ambulatory Visit (INDEPENDENT_AMBULATORY_CARE_PROVIDER_SITE_OTHER): Payer: Medicare Other | Admitting: Internal Medicine

## 2017-07-30 VITALS — BP 124/70 | HR 72 | Temp 98.1°F | Resp 10 | Ht 60.0 in | Wt 141.0 lb

## 2017-07-30 DIAGNOSIS — M1710 Unilateral primary osteoarthritis, unspecified knee: Secondary | ICD-10-CM | POA: Diagnosis not present

## 2017-07-30 DIAGNOSIS — M179 Osteoarthritis of knee, unspecified: Secondary | ICD-10-CM

## 2017-07-30 DIAGNOSIS — E785 Hyperlipidemia, unspecified: Secondary | ICD-10-CM

## 2017-07-30 DIAGNOSIS — I5032 Chronic diastolic (congestive) heart failure: Secondary | ICD-10-CM | POA: Diagnosis not present

## 2017-07-30 DIAGNOSIS — I10 Essential (primary) hypertension: Secondary | ICD-10-CM

## 2017-07-30 DIAGNOSIS — G479 Sleep disorder, unspecified: Secondary | ICD-10-CM

## 2017-07-30 DIAGNOSIS — R350 Frequency of micturition: Secondary | ICD-10-CM | POA: Diagnosis not present

## 2017-07-30 DIAGNOSIS — J3489 Other specified disorders of nose and nasal sinuses: Secondary | ICD-10-CM | POA: Diagnosis not present

## 2017-07-30 DIAGNOSIS — I890 Lymphedema, not elsewhere classified: Secondary | ICD-10-CM | POA: Diagnosis not present

## 2017-07-30 DIAGNOSIS — Z79899 Other long term (current) drug therapy: Secondary | ICD-10-CM | POA: Diagnosis not present

## 2017-07-30 DIAGNOSIS — R4189 Other symptoms and signs involving cognitive functions and awareness: Secondary | ICD-10-CM | POA: Diagnosis not present

## 2017-07-30 LAB — POCT URINALYSIS DIPSTICK
BILIRUBIN UA: NEGATIVE
Blood, UA: NEGATIVE
GLUCOSE UA: NEGATIVE
KETONES UA: NEGATIVE
LEUKOCYTES UA: NEGATIVE
Nitrite, UA: NEGATIVE
Odor: NORMAL
Protein, UA: NEGATIVE
SPEC GRAV UA: 1.01 (ref 1.010–1.025)
Urobilinogen, UA: 0.2 E.U./dL
pH, UA: 6 (ref 5.0–8.0)

## 2017-07-30 MED ORDER — DONEPEZIL HCL 5 MG PO TABS
5.0000 mg | ORAL_TABLET | Freq: Every day | ORAL | 3 refills | Status: DC
Start: 2017-07-30 — End: 2017-11-17

## 2017-07-30 NOTE — Progress Notes (Signed)
Patient ID: [name] Morton, female   DOB: Mar 22, 1926, 84 y.o.   MRN: [id number]   Location:  [facility] OFFICE  Provider: DR Arletha Grippe  Code Status:  Goals of Care:  Advanced Directives 07/20/2016  Does Patient Have a Medical Advance Directive? Yes  Type of Advance Directive Living will  Does patient want to make changes to medical advance directive? No - Patient declined  Copy of [place] in Chart? -  Would patient like information on creating a medical advance directive? -  Pre-existing out of facility DNR order (yellow form or pink MOST form) -     Chief Complaint  Patient presents with  . Establish Care    New patient establish care, here with daughter [name]. Anal drainage x 3 weeks ago.Patient c/o burning feet x several months and runny nose (related to pollen season). Lymphedema in right arm, with radiating pain into fingers (history of breast cancer). Leg pain below knee, alternates.   . Medication Management    Patient was on Aricept x 2 months and now out of medication x 1 month, need to discuss   . Advance Care Screening    HCPOA/Living Will, would like to discuss DNR   . Medication Management    Discuss if ok to take Zyrtec     HPI: Patient is a 84 y.o. female seen today as a new pt. She has several concerns. She is a poor historian due to memory loss. Hx obtained from chart and daughter  She has short term memory loss - had MMSE in March 2019 that showed mild dementia. She was started on low dose aricept. She is now out of medication. She is still driving. She has urinary incontinence. Daughter states pt "hides things" including  She has burning sensations in her feet  She is followed by GI Dr Selinda Michaels for "anal sepage" - CT abd/pelvis in 2015 revealed colitis; pancreatic lipoma. She takes miralax for constipation  She has chronic rhinorrhea - past allergy testing neg.  HTN/chronic dHF - BP stable on zebeta, lasix, lotrel. 2D echo in 08/2016  revealed nml EF; mild LVH; grade 1 DD; mild AS/AR; mild MR; mild increased pulmonary artery pressure at 9mm Hg. Followed by cardio Dr Ellyn Hack  Hyperlipidemia - takes zocor daily  insomnia- chronic issue since childhood. She watches TV most of the night and gets approx 1-2 hrs/night of sleep. Takes melatonin aqhs and prn trazodone  Hx right breast CA - s/p lumpectomy with AND and XRT. She has chronic RUE lymphedema.  She is a retired Therapist, sports.  Past Medical History:  Diagnosis Date  . Breast cancer ([place]) 1997   right - tx with lumpectomty & radiation  . Burning sensation of feet    Per New Patient Packet-PSC   . Chronic kidney disease    Per New Patient Packet-PSC   . Cognitive impairment    Per New Patient Packet-PSC   . Diverticulosis   . Dyslipidemia   . Fluid retention    Per New Patient Packet-PSC   . GERD (gastroesophageal reflux disease)    RARE - NO MEDICATIONS  . Hearing impairment    BILATERAL HEARING AIDS  . Heart murmur    Per New Patient Packet-PSC   . High cholesterol    Per New Patient Packet-PSC   . History of nuclear stress test 11/2009   dipyridamole; no evidence of ischemia, normal, low risk   . History of shingles 1998   NO RESIDUAL PROBLEMS  .  Hypertension    SEES CARDIOLOGIST DR. DAVID HARDING FOR HYPERTENSION   . Lymphedema of arm    RIGHT   . OA (osteoarthritis)    OA AND PAIN LEFT KNEE  . Scoliosis    Per New Patient Packet-PSC   . Shortness of breath    WHEN CLIMBING STEPS AT MY HOME--OTHERWISE OK  . Sinus problem   . Sleep difficulties    ALWAYS HAD SLEEPING PROBLEMS - MEDICATION HELPS PT SLEEP ABOUT 3 HOURS  . SOB (shortness of breath)    Per New Patient Packet-PSC, hospitalized 2018     Past Surgical History:  Procedure Laterality Date  . ABDOMINAL HYSTERECTOMY  1973  . APPENDECTOMY  1951  . BREAST ADENOMA  1950   EXCISED  . BREAST LUMPECTOMY Right 1997  . COLONOSCOPY  2014   Dr.Sam Penelope Coop, Per New Patient Packet-PSC   . EYE SURGERY      BILATERAL CATARACT EXTRACTIONS AND LENS IMPLANTS  . NM MYOVIEW LTD  08/2016   Hyperdynamic LV function EF 65%. No ischemia or infarction. LOW RISK  . REPLACEMENT TOTAL KNEE  2002  . ROTATOR CUFF REPAIR Right 12/2009   Dr. Lenna Sciara. [place]   . TOTAL KNEE ARTHROPLASTY Left 05/01/2013   Procedure: LEFT TOTAL KNEE ARTHROPLASTY;  Surgeon: Gearlean Alf, MD;  Location: [facility] ORS;  Service: Orthopedics;  Laterality: Left;  . TRANSTHORACIC ECHOCARDIOGRAM  05/1994   trace mitral/tricuspic/aortic inusff; hyperkinetic LV  . TRANSTHORACIC ECHOCARDIOGRAM  08/2016   Normal LV size. EF 65-70%. GR 1 DD. (Normal for age). Mild aortic stenosis     reports that she has quit smoking. She quit after 5.00 years of use. She has never used smokeless tobacco. She reports that she does not drink alcohol or use drugs. Social History   Socioeconomic History  . Marital status: Widowed    Spouse name: Not on file  . Number of children: 2  . Years of education: Not on file  . Highest education level: Not on file  Occupational History  . Occupation: retired Animal nutritionist  . Financial resource strain: Not on file  . Food insecurity:    Worry: Not on file    Inability: Not on file  . Transportation needs:    Medical: Not on file    Non-medical: Not on file  Tobacco Use  . Smoking status: Former Smoker    Years: 5.00  . Smokeless tobacco: Never Used  Substance and Sexual Activity  . Alcohol use: No  . Drug use: No  . Sexual activity: Not on file  Lifestyle  . Physical activity:    Days per week: Not on file    Minutes per session: Not on file  . Stress: Not on file  Relationships  . Social connections:    Talks on phone: Not on file    Gets together: Not on file    Attends religious service: Not on file    Active member of club or organization: Not on file    Attends meetings of clubs or organizations: Not on file    Relationship status: Not on file  . Intimate partner violence:    Fear of current  or ex partner: Not on file    Emotionally abused: Not on file    Physically abused: Not on file    Forced sexual activity: Not on file  Other Topics Concern  . Not on file  Social History Narrative   Widowed since 11/2010.  Mother of  2.  No real exercise due to knee.   Smoki: No   EtOH: NO      As of 07/30/17-PSC:   Diet: N/A      Caffeine: Coffee, Dr.Pepper      Married, if yes what year: Widowed      Do you live in a house, apartment, assisted living, condo, trailer, ect: House, more than 1 stories, split level 1 person       Pets: No       Highest Level of education: Nursing School in [place], Therapist, sports       Current/Past profession: RN      Exercise: No         Living Will: Yes   DNR: No   POA/HPOA: Yes      Functional Status:   Do you have difficulty bathing or dressing yourself? No   Do you have difficulty preparing food or eating? Yes   Do you have difficulty managing your medications? Yes   Do you have difficulty managing your finances? Yes   Do you have difficulty affording your medications? Yes    Family History  Problem Relation Age of Onset  . Heart attack Mother   . Arthritis Mother   . Dementia Mother   . Heart Problems Father   . Stroke Father   . Heart Problems Maternal Grandmother   . Heart Problems Maternal Grandfather   . Asthma Maternal Grandfather   . Heart attack Brother 16  . Heart disease Brother        CABG  . Lung cancer Sister   . Emphysema Sister   . Cancer Sister   . Narcolepsy Child   . Arthritis Child   . High blood pressure Child   . Alcoholism Brother   . Post-traumatic stress disorder Brother   . COPD Sister   . Seizures Sister   . Heart Problems Sister     Allergies  Allergen Reactions  . Benadryl [Diphenhydramine Hcl (Sleep)]     Makes me hyper   . Cardizem [Diltiazem Hcl]     Doesn't remember. Dr Rex Kras told patient not to take.   [name] Kitchen Ceftin [Cefuroxime Axetil] Other (See Comments)    Stomach Issues   . Epinephrine      unknown  . Tetanus Toxoids   . Vistaril [Hydroxyzine]     unknown  . Vytorin [Ezetimibe-Simvastatin]     Not comfortable taking it. Doesn't remember reaction.     Outpatient Encounter Medications as of 07/30/2017  Medication Sig  . acetaminophen (TYLENOL) 500 MG tablet Take 500 mg by mouth every 6 (six) hours as needed for moderate pain.  [name] Kitchen amLODipine-benazepril (LOTREL) 10-20 MG capsule TAKE 1 CAPSULE DAILY  . bisoprolol (ZEBETA) 5 MG tablet Take 1 tablet (5 mg total) by mouth daily.  . Calcium Carb-Cholecalciferol (CALCIUM 600+D3) 600-800 MG-UNIT TABS Take by mouth daily.  . furosemide (LASIX) 40 MG tablet TAKE 40 MG BY MOUTH DAILY AND MAY TAKE AN EXTRA 20 MG ( 1/2 TABLET) IF NEEDED FOR PUFFINESS  . Liniments (SALONPAS EX) Apply topically.  . Melatonin 5 MG TABS Take by mouth at bedtime.  . Multiple Vitamins-Minerals (MULTIVITAMIN WOMEN 50+) TABS Take by mouth daily.  . polyethylene glycol (MIRALAX / GLYCOLAX) packet Take 17 g by mouth daily as needed.   . simvastatin (ZOCOR) 20 MG tablet Take 1 tablet (20 mg total) by mouth at bedtime.  . traZODone (DESYREL) 100 MG tablet Take 1 tablet by mouth at bedtime as  needed.   [name] Kitchen UNABLE TO FIND Med Name: Pain relieving foot cream and relaxing leg cream, apply to legs and feet as needed for burning sensation in feet at night  . [DISCONTINUED] calcium-vitamin D (OSCAL WITH D) 500-200 MG-UNIT tablet Take 1 tablet by mouth daily with breakfast.   No facility-administered encounter medications on file as of 07/30/2017.     Review of Systems:  Review of Systems  HENT: Positive for hearing loss (wears 1 hearing aid).   Respiratory: Positive for shortness of breath.   Endocrine: Positive for cold intolerance.  Musculoskeletal: Positive for arthralgias and back pain.  Neurological: Positive for weakness and numbness.  Psychiatric/Behavioral: Positive for agitation. The patient is nervous/anxious.   All other systems reviewed and are negative. per  new pt packet  Health Maintenance  Topic Date Due  . TETANUS/TDAP  08/08/1947  . DEXA SCAN  08/07/1993  . PNA vac Low Risk Adult (1 of 2 - PCV13) 08/07/1993  . INFLUENZA VACCINE  09/30/2017    Physical Exam: Vitals:   07/30/17 0833  BP: 124/70  Pulse: 72  Resp: 10  Temp: 98.1 F (36.7 C)  TempSrc: Oral  Weight: 141 lb (64 kg)  Height: 5' (1.524 m)   Body mass index is 27.54 kg/m. Physical Exam  Constitutional: She appears well-developed and well-nourished.  HENT:  Mouth/Throat: Oropharynx is clear and moist. No oropharyngeal exudate.  MMM; no oral thrush  Eyes: Pupils are equal, round, and reactive to light. No scleral icterus.  Neck: Neck supple. Carotid bruit is not present. No tracheal deviation present.  Cardiovascular: Normal rate, regular rhythm and intact distal pulses. Exam reveals no gallop and no friction rub.  Murmur (1/6 SEM) heard. Trace LE edema b/l. No calf TTP  Pulmonary/Chest: Effort normal and breath sounds normal. No stridor. No respiratory distress. She has no wheezes. She has no rales.  Abdominal: Soft. Normal appearance and bowel sounds are normal. She exhibits no distension and no mass. There is no hepatomegaly. There is no tenderness. There is no rigidity, no rebound and no guarding. No hernia.  No CVAT; obese  Musculoskeletal: She exhibits edema (L>R knee/ right thumb base) and deformity (lumbar scoliosis).       Lumbar back: She exhibits decreased range of motion, tenderness and spasm.       Back:  R>L paravertebral muscle hypertrophy with ropy tissue texture changes lumbar and thoracic spine; neg SLR  Lymphadenopathy:    She has no cervical adenopathy.  Neurological: She is alert. She has normal reflexes. A sensory deficit (b/l reduced monofilament ) is present. Gait (unsteady; uses cane) abnormal.  Skin: Skin is warm and dry. No rash noted.  Psychiatric: She has a normal mood and affect. Her behavior is normal. Judgment and thought content  normal.    Labs reviewed: Basic Metabolic Panel: No results for input(s): NA, K, CL, CO2, GLUCOSE, BUN, CREATININE, CALCIUM, MG, PHOS, TSH in the last 8760 hours. Liver Function Tests: No results for input(s): AST, ALT, ALKPHOS, BILITOT, PROT, ALBUMIN in the last 8760 hours. No results for input(s): LIPASE, AMYLASE in the last 8760 hours. No results for input(s): AMMONIA in the last 8760 hours. CBC: No results for input(s): WBC, NEUTROABS, HGB, HCT, MCV, PLT in the last 8760 hours. Lipid Panel: No results for input(s): CHOL, HDL, LDLCALC, TRIG, CHOLHDL, LDLDIRECT in the last 8760 hours. No results found for: HGBA1C  Procedures since last visit: No results found.  Assessment/Plan   ICD-10-CM   1. Urinary  frequency R35.0 POC Urinalysis Dipstick  2. Cognitive impairment R41.89 donepezil (ARICEPT) 5 MG tablet  3. Difficulty sleeping G47.9   4. Rhinorrhea J34.89   5. Lymphedema of right upper extremity I89.0   6. Diastolic dysfunction with chronic heart failure (HCC) I50.32   7. Osteoarthritis of knee, unspecified laterality, unspecified osteoarthritis type M17.10   8. Essential hypertension I10   9. Dyslipidemia, goal LDL below 100 E78.5      May need labs - a1c, b12 level, folate to further assess neuropathy  May need NCS/EMG to further assess neuropathy  Get old records  She has an appt with neuro Dr Delice Lesch in 2 weeks  START PLAIN ZYRTEC 10MG  DAILY FOR RUNNY NOSE  Continue other medications as ordered  Follow up with cardiology as scheduled  NO UTI on urine test today  Follow up in 3 mos for memory loss, HTN, neuropathy and HF. Fasting labs prior to appt   [place] S. Perlie Gold  [facility] [address] [place],  [address] [phone] Cell (Monday-Friday 8 AM - 5 PM) [phone] After 5 PM and follow prompts

## 2017-07-30 NOTE — Patient Instructions (Addendum)
START PLAIN ZYRTEC 10MG  DAILY FOR RUNNY NOSE  Continue other medications as ordered  Follow up with neurology and cardiology as scheduled  NO UTI on urine test today  Follow up in 3 mos for memory loss, HTN, neuropathy and HF. Fasting labs prior to appt

## 2017-08-17 ENCOUNTER — Encounter: Payer: Self-pay | Admitting: Neurology

## 2017-08-17 ENCOUNTER — Ambulatory Visit (INDEPENDENT_AMBULATORY_CARE_PROVIDER_SITE_OTHER): Payer: Medicare Other | Admitting: Neurology

## 2017-08-17 ENCOUNTER — Other Ambulatory Visit: Payer: Self-pay

## 2017-08-17 VITALS — BP 132/64 | HR 71 | Ht 60.0 in | Wt 142.0 lb

## 2017-08-17 DIAGNOSIS — F0390 Unspecified dementia without behavioral disturbance: Secondary | ICD-10-CM

## 2017-08-17 DIAGNOSIS — G629 Polyneuropathy, unspecified: Secondary | ICD-10-CM

## 2017-08-17 DIAGNOSIS — F03A0 Unspecified dementia, mild, without behavioral disturbance, psychotic disturbance, mood disturbance, and anxiety: Secondary | ICD-10-CM

## 2017-08-17 MED ORDER — GABAPENTIN 100 MG PO CAPS: 100.0000 mg | ORAL_CAPSULE | Freq: Every day | ORAL | 3 refills | Status: DC

## 2017-08-17 NOTE — Patient Instructions (Signed)
1. Bloodwork from Dr. Inda Merlin will be requested for review 2. Start gabapentin 100mg : take 1 capsule at bedtime 3. Continue Donepezil 5mg  daily 4. Start having more help at home, increase supervision with [employer] and continue with having them do the driving 5. Follow-up in 6 months, call for any changes  FALL PRECAUTIONS: Be cautious when walking. Scan the area for obstacles that may increase the risk of trips and falls. When getting up in the mornings, sit up at the edge of the bed for a few minutes before getting out of bed. Consider elevating the bed at the head end to avoid drop of blood pressure when getting up. Walk always in a well-lit room (use night lights in the walls). Avoid area rugs or power cords from appliances in the middle of the walkways. Use a walker or a cane if necessary and consider physical therapy for balance exercise. Get your eyesight checked regularly.  FINANCIAL OVERSIGHT: Supervision, especially oversight when making financial decisions or transactions is also recommended.  HOME SAFETY: Consider the safety of the kitchen when operating appliances like stoves, microwave oven, and blender. Consider having supervision and share cooking responsibilities until no longer able to participate in those. Accidents with firearms and other hazards in the house should be identified and addressed as well.  DRIVING: Regarding driving, in patients with progressive memory problems, driving will be impaired. We advise to have someone else do the driving if trouble finding directions or if minor accidents are reported. Independent driving assessment is available to determine safety of driving.  ABILITY TO BE LEFT ALONE: If patient is unable to contact 911 operator, consider using LifeLine, or when the need is there, arrange for someone to stay with patients. Smoking is a fire hazard, consider supervision or cessation. Risk of wandering should be assessed by caregiver and if detected at any  point, supervision and safe proof recommendations should be instituted.  MEDICATION SUPERVISION: Inability to self-administer medication needs to be constantly addressed. Implement a mechanism to ensure safe administration of the medications.  RECOMMENDATIONS FOR ALL PATIENTS WITH MEMORY PROBLEMS: 1. Continue to exercise (Recommend 30 minutes of walking everyday, or 3 hours every week) 2. Increase social interactions - continue going to [place] and enjoy social gatherings with friends and family 3. Eat healthy, avoid fried foods and eat more fruits and vegetables 4. Maintain adequate blood pressure, blood sugar, and blood cholesterol level. Reducing the risk of stroke and cardiovascular disease also helps promoting better memory. 5. Avoid stressful situations. Live a simple life and avoid aggravations. Organize your time and prepare for the next day in anticipation. 6. Sleep well, avoid any interruptions of sleep and avoid any distractions in the bedroom that may interfere with adequate sleep quality 7. Avoid sugar, avoid sweets as there is a strong link between excessive sugar intake, diabetes, and cognitive impairment The Mediterranean diet has been shown to help patients reduce the risk of progressive memory disorders and reduces cardiovascular risk. This includes eating fish, eat fruits and green leafy vegetables, nuts like almonds and hazelnuts, walnuts, and also use olive oil. Avoid fast foods and fried foods as much as possible. Avoid sweets and sugar as sugar use has been linked to worsening of memory function.  There is always a concern of gradual progression of memory problems. If this is the case, then we may need to adjust level of care according to patient needs. Support, both to the patient and caregiver, should then be put into place.

## 2017-08-17 NOTE — Progress Notes (Signed)
NEUROLOGY CONSULTATION NOTE  [name] Morton MRN: [id number] DOB: November 06, 1926  Referring provider: Dr. Darcus Austin Primary care provider: Dr. Gildardo Cranker  Reason for consult:  dementia  Dear Dr Inda Merlin:  Thank you for your kind referral of [name] Morton for consultation of the above symptoms. Although her history is well known to you, please allow me to reiterate it for the purpose of our medical record. The patient was accompanied to the clinic by her daughter who also provides collateral information. Records and images were personally reviewed where available.  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 She reports her memory is bad, she has a lot of problems remembering from things back. She lives alone. Her daughter started noticing memory changes around 2 years ago, more noticeable this past year. She would repeat herself, asking the same questions. Her daughter lives 2 hours away and expressed concern about her medications. She apparently went 3 weeks without 3 medications because her mail delivery did not arrive. Her daughter visits her every 1-2 weeks and noticed she misses her medications when visiting. She denies getting lost driving but kept asking her daughter about the GPS feature on the phone. Her daughter has hired Company secretary to come at home 10 hours a week, they drive her to appointments. Her daughter took over bills this year, she was not comfortable with writing checks, and a lot of times would have a question about a charge. She misplaces things frequently, around 2-3 years ago she would blame her daughter when she would not find them. Her mother had dementia. No history of significant head injuries or alcohol intake. Her daughter has noticed more irritability, stating "mother's always right," and when she thinks she is right and this  is contested, she does not take it well. No recent paranoia, no hallucinations. She is able to bathe and dress independently.  She has low back pain, right thumb pain after right arm surgery. She has near-constant burning pain in the bottom of her feet. She has had occasional loose stools with leakage the past 6 months. Otherwise she denies any headaches, dizziness, vision changes, dysarthria/dysphagia, neck/back pain, bladder dysfunction, anosmia, or tremors. She states she never sleeps, she usually watches TV or reads. She denies daytime drowsiness but her daughter disagrees.   She had an MRI brain with and without contrast done 05/25/17 which did not show any acute changes. There were numerous nonspecific foci of T2 FLAIR hyperintense signal changes in the subcortical and periventricular white matter, moderate diffuse volume loss, no abnormal enhancement.   Laboratory Data: TSH 2.18 in 04/2017  PAST MEDICAL HISTORY: Past Medical History:  Diagnosis Date  . Breast cancer ([facility]) 1997   right - tx with lumpectomty & radiation  . Burning sensation of feet    Per New Patient Packet-[facility]   . Chronic kidney disease    Per New Patient Packet-[facility]   . Cognitive impairment    Per New Patient Packet-[facility]   . Diverticulosis   . Dyslipidemia   . Fluid retention    Per New Patient Packet-[facility]   . GERD (gastroesophageal reflux disease)    RARE - NO MEDICATIONS  . Hearing impairment    BILATERAL HEARING AIDS  . Heart murmur    Per New Patient Packet-[facility]   . High cholesterol    Per New Patient Packet-[facility]   . History of  nuclear stress test 11/2009   dipyridamole; no evidence of ischemia, normal, low risk   . History of shingles 1998   NO RESIDUAL PROBLEMS  . Hypertension    SEES CARDIOLOGIST DR. DAVID HARDING FOR HYPERTENSION   . Lymphedema of arm    RIGHT   . OA (osteoarthritis)    OA AND PAIN LEFT KNEE  . Scoliosis    Per New Patient Packet-[facility]   . Shortness of breath    WHEN CLIMBING STEPS  AT MY HOME--OTHERWISE OK  . Sinus problem   . Sleep difficulties    ALWAYS HAD SLEEPING PROBLEMS - MEDICATION HELPS PT SLEEP ABOUT 3 HOURS  . SOB (shortness of breath)    Per New Patient Packet-PSC, hospitalized 2018     PAST SURGICAL HISTORY: Past Surgical History:  Procedure Laterality Date  . ABDOMINAL HYSTERECTOMY  1973  . APPENDECTOMY  1951  . BREAST ADENOMA  1950   EXCISED  . BREAST LUMPECTOMY Right 1997  . COLONOSCOPY  2014   Dr.Sam Penelope Coop, Per New Patient Packet-PSC   . EYE SURGERY     BILATERAL CATARACT EXTRACTIONS AND LENS IMPLANTS  . NM MYOVIEW LTD  08/2016   Hyperdynamic LV function EF 65%. No ischemia or infarction. LOW RISK  . REPLACEMENT TOTAL KNEE  2002  . ROTATOR CUFF REPAIR Right 12/2009   Dr. Lenna Sciara. [place]   . TOTAL KNEE ARTHROPLASTY Left 05/01/2013   Procedure: LEFT TOTAL KNEE ARTHROPLASTY;  Surgeon: Gearlean Alf, MD;  Location: [facility];  Service: Orthopedics;  Laterality: Left;  . TRANSTHORACIC ECHOCARDIOGRAM  05/1994   trace mitral/tricuspic/aortic inusff; hyperkinetic LV  . TRANSTHORACIC ECHOCARDIOGRAM  08/2016   Normal LV size. EF 65-70%. GR 1 DD. (Normal for age). Mild aortic stenosis    MEDICATIONS: Current Outpatient Medications on File Prior to Visit  Medication Sig Dispense Refill  . acetaminophen (TYLENOL) 500 MG tablet Take 500 mg by mouth every 6 (six) hours as needed for moderate pain.    [name] Kitchen amLODipine-benazepril (LOTREL) 10-20 MG capsule TAKE 1 CAPSULE DAILY 90 capsule 3  . bisoprolol (ZEBETA) 5 MG tablet Take 1 tablet (5 mg total) by mouth daily. 90 tablet 1  . Calcium Carb-Cholecalciferol (CALCIUM 600+D3) 600-800 MG-UNIT TABS Take by mouth daily.    [name] Kitchen donepezil (ARICEPT) 5 MG tablet Take 1 tablet (5 mg total) by mouth at bedtime. For memory loss 30 tablet 3  . furosemide (LASIX) 40 MG tablet TAKE 40 MG BY MOUTH DAILY AND MAY TAKE AN EXTRA 20 MG ( 1/2 TABLET) IF NEEDED FOR PUFFINESS 135 tablet 3  . Liniments (SALONPAS EX) Apply topically.    .  Melatonin 5 MG TABS Take by mouth at bedtime.    . Multiple Vitamins-Minerals (MULTIVITAMIN WOMEN 50+) TABS Take by mouth daily.    . polyethylene glycol (MIRALAX / GLYCOLAX) packet Take 17 g by mouth daily as needed.     . simvastatin (ZOCOR) 20 MG tablet Take 1 tablet (20 mg total) by mouth at bedtime. 90 tablet 3  . traZODone (DESYREL) 100 MG tablet Take 1 tablet by mouth at bedtime as needed.     [name] Kitchen UNABLE TO FIND Med Name: Pain relieving foot cream and relaxing leg cream, apply to legs and feet as needed for burning sensation in feet at night     No current facility-administered medications on file prior to visit.     ALLERGIES: Allergies  Allergen Reactions  . Benadryl [Diphenhydramine Hcl (Sleep)]     Makes me  hyper   . Cardizem [Diltiazem Hcl]     Doesn't remember. Dr Rex Kras told patient not to take.   [name] Kitchen Ceftin [Cefuroxime Axetil] Other (See Comments)    Stomach Issues   . Epinephrine     unknown  . Tetanus Toxoids   . Vistaril [Hydroxyzine]     unknown  . Vytorin [Ezetimibe-Simvastatin]     Not comfortable taking it. Doesn't remember reaction.     FAMILY HISTORY: Family History  Problem Relation Age of Onset  . Heart attack Mother   . Arthritis Mother   . Dementia Mother   . Heart Problems Father   . Stroke Father   . Heart Problems Maternal Grandmother   . Heart Problems Maternal Grandfather   . Asthma Maternal Grandfather   . Heart attack Brother 59  . Heart disease Brother        CABG  . Lung cancer Sister   . Emphysema Sister   . Cancer Sister   . Narcolepsy Child   . Arthritis Child   . High blood pressure Child   . Alcoholism Brother   . Post-traumatic stress disorder Brother   . COPD Sister   . Seizures Sister   . Heart Problems Sister     SOCIAL HISTORY: Social History   Socioeconomic History  . Marital status: Widowed    Spouse name: Not on file  . Number of children: 2  . Years of education: Not on file  . Highest education level: Not  on file  Occupational History  . Occupation: retired Animal nutritionist  . Financial resource strain: Not on file  . Food insecurity:    Worry: Not on file    Inability: Not on file  . Transportation needs:    Medical: Not on file    Non-medical: Not on file  Tobacco Use  . Smoking status: Former Smoker    Years: 5.00  . Smokeless tobacco: Never Used  Substance and Sexual Activity  . Alcohol use: No  . Drug use: No  . Sexual activity: Not on file  Lifestyle  . Physical activity:    Days per week: Not on file    Minutes per session: Not on file  . Stress: Not on file  Relationships  . Social connections:    Talks on phone: Not on file    Gets together: Not on file    Attends religious service: Not on file    Active member of club or organization: Not on file    Attends meetings of clubs or organizations: Not on file    Relationship status: Not on file  . Intimate partner violence:    Fear of current or ex partner: Not on file    Emotionally abused: Not on file    Physically abused: Not on file    Forced sexual activity: Not on file  Other Topics Concern  . Not on file  Social History Narrative   Widowed since 11/2010.  Mother of 2.  No real exercise due to knee.   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 No   Do you have difficulty preparing food or eating? Yes   Do you have difficulty managing your medications? Yes   Do you have difficulty managing your finances? Yes   Do you have  difficulty affording your medications? Yes    REVIEW OF SYSTEMS: Constitutional: No fevers, chills, or sweats, no generalized fatigue, change in appetite Eyes: No visual changes, double vision, eye pain Ear, nose and throat: No hearing loss, ear pain, nasal congestion, sore throat Cardiovascular: No chest pain, palpitations Respiratory:  No shortness of breath at rest or with exertion, wheezes GastrointestinaI: No nausea, vomiting, diarrhea, abdominal pain, fecal incontinence Genitourinary:  No dysuria, urinary retention or frequency Musculoskeletal:  No neck pain, back pain Integumentary: No rash, pruritus, skin lesions Neurological: as above Psychiatric: No depression, insomnia, anxiety Endocrine: No palpitations, fatigue, diaphoresis, mood swings, change in appetite, change in weight, increased thirst Hematologic/Lymphatic:  No anemia, purpura, petechiae. Allergic/Immunologic: no itchy/runny eyes, nasal congestion, recent allergic reactions, rashes  PHYSICAL EXAM: Vitals:   08/17/17 1043  BP: 132/64  Pulse: 71  SpO2: 96%   General: No acute distress Head:  Normocephalic/atraumatic Eyes: Fundoscopic exam shows bilateral sharp discs, no vessel changes, exudates, or hemorrhages Neck: supple, no paraspinal tenderness, full range of motion Back: No paraspinal tenderness Heart: regular rate and rhythm Lungs: Clear to auscultation bilaterally. Vascular: No carotid bruits. Skin/Extremities: No rash, no edema Neurological Exam: Mental status: alert and oriented to person, place, and time, no dysarthria or aphasia, Fund of knowledge is appropriate.  Recent and remote memory are intact.  Attention and concentration are normal.    Able to name objects and repeat phrases.  Montreal Cognitive Assessment  08/17/2017  Visuospatial/ Executive (0/5) 5  Naming (0/3) 2  Attention: Read list of digits (0/2) 2  Attention: Read list of letters (0/1) 1  Attention: Serial 7 subtraction starting at  100 (0/3) 3  Language: Repeat phrase (0/2) 2  Language : Fluency (0/1) 0  Abstraction (0/2) 2  Delayed Recall (0/5) 0  Orientation (0/6) 4  Total 21   Cranial nerves: CN I: not tested CN II: pupils equal, round and reactive to light, visual fields intact, fundi unremarkable. CN III, IV, VI:  full range of motion, no nystagmus, no ptosis CN V: facial sensation intact CN VII: upper and lower face symmetric CN VIII: hearing intact to finger rub CN IX, X: gag intact, uvula midline CN XI: sternocleidomastoid and trapezius muscles intact CN XII: tongue midline Bulk & Tone: normal, no fasciculations. Motor: 5/5 throughout with no pronator drift. Sensation: intact to light touch, cold, pin on both UE, decreased cold to knees, decreased pin and vibration to ankles bilaterally. No extinction to double simultaneous stimulation.  Romberg test positive sway Deep Tendon Reflexes: +2 throughout except for absent ankle jerks bilaterally, no ankle clonus Plantar responses: downgoing bilaterally Cerebellar: no incoordination on finger to nose testing Gait: narrow-based and steady, unable to tandem walk Tremor: none  IMPRESSION: This is an 84 year old right-handed woman with a history of  history of hypertension, hyperlipidemia, breast cancer, presenting for evaluation of dementia. Her neurological exam shows evidence of a  length-dependent neuropathy, MOCA score 21/30. MRI brain no acute changes. She is having more difficulties with complex tasks such as managing medications. Symptoms suggestive of mild dementia, likely Alzheimer's disease. She is taking Donepezil 5mg  daily, continue current medication. They are reporting diarrhea, continue to monitor if related to medication. We had an extensive discussion about the diagnosis and prognosis, including having more help at home, she has [employer] coming 10 hours a week, would gradually increase hours. No further driving. She would like to try a medication  for neuropathic pain, start low dose gabapentin 100mg  qhs, side effects were discussed. If not done, check HbA1c, B12 levels for treatable causes of neuropathy. We discussed the importance of control of vascular risk factors, physical exercise, and brain stimulation exercises for brain health. She will follow-up in 6 months and knows to call for any changes.   Thank you for allowing me to participate in the care of this patient. Please do not hesitate to call for any questions or concerns.   [name] Morton, M.D.  CC: Dr. Inda Merlin, Dr. Eulas Post

## 2017-08-24 ENCOUNTER — Encounter: Payer: Self-pay | Admitting: Neurology

## 2017-08-24 DIAGNOSIS — F0390 Unspecified dementia without behavioral disturbance: Secondary | ICD-10-CM | POA: Insufficient documentation

## 2017-08-24 DIAGNOSIS — F03A0 Unspecified dementia, mild, without behavioral disturbance, psychotic disturbance, mood disturbance, and anxiety: Secondary | ICD-10-CM | POA: Insufficient documentation

## 2017-08-24 DIAGNOSIS — G629 Polyneuropathy, unspecified: Secondary | ICD-10-CM | POA: Insufficient documentation

## 2017-10-20 ENCOUNTER — Encounter: Payer: Self-pay | Admitting: Internal Medicine

## 2017-11-08 ENCOUNTER — Other Ambulatory Visit: Payer: Medicare Other

## 2017-11-10 ENCOUNTER — Ambulatory Visit: Payer: Medicare Other | Admitting: Internal Medicine

## 2017-11-10 DIAGNOSIS — I890 Lymphedema, not elsewhere classified: Secondary | ICD-10-CM | POA: Diagnosis not present

## 2017-11-10 DIAGNOSIS — Z79899 Other long term (current) drug therapy: Secondary | ICD-10-CM | POA: Diagnosis not present

## 2017-11-10 DIAGNOSIS — I10 Essential (primary) hypertension: Secondary | ICD-10-CM | POA: Diagnosis not present

## 2017-11-10 DIAGNOSIS — E785 Hyperlipidemia, unspecified: Secondary | ICD-10-CM | POA: Diagnosis not present

## 2017-11-17 ENCOUNTER — Emergency Department (HOSPITAL_COMMUNITY): Payer: Self-pay

## 2017-11-17 ENCOUNTER — Encounter (HOSPITAL_COMMUNITY): Payer: Self-pay

## 2017-11-17 ENCOUNTER — Emergency Department (HOSPITAL_COMMUNITY): Payer: Medicare Other

## 2017-11-17 ENCOUNTER — Ambulatory Visit: Payer: Self-pay | Admitting: Internal Medicine

## 2017-11-17 ENCOUNTER — Emergency Department (HOSPITAL_BASED_OUTPATIENT_CLINIC_OR_DEPARTMENT_OTHER): Payer: Medicare Other

## 2017-11-17 ENCOUNTER — Encounter: Payer: Self-pay | Admitting: Internal Medicine

## 2017-11-17 ENCOUNTER — Emergency Department (HOSPITAL_COMMUNITY)
Admission: EM | Admit: 2017-11-17 | Discharge: 2017-11-17 | Disposition: A | Discharge status: Home / Self Care | Payer: Medicare Other | Attending: Emergency Medicine | Admitting: Emergency Medicine

## 2017-11-17 ENCOUNTER — Telehealth: Payer: Self-pay | Admitting: Vascular Surgery

## 2017-11-17 ENCOUNTER — Ambulatory Visit (INDEPENDENT_AMBULATORY_CARE_PROVIDER_SITE_OTHER): Payer: Medicare Other | Admitting: Internal Medicine

## 2017-11-17 VITALS — BP 124/70 | HR 116 | Temp 98.2°F | Ht 60.0 in | Wt 137.0 lb

## 2017-11-17 DIAGNOSIS — Z853 Personal history of malignant neoplasm of breast: Secondary | ICD-10-CM | POA: Insufficient documentation

## 2017-11-17 DIAGNOSIS — Z96652 Presence of left artificial knee joint: Secondary | ICD-10-CM | POA: Insufficient documentation

## 2017-11-17 DIAGNOSIS — M79609 Pain in unspecified limb: Secondary | ICD-10-CM

## 2017-11-17 DIAGNOSIS — R0602 Shortness of breath: Secondary | ICD-10-CM | POA: Diagnosis not present

## 2017-11-17 DIAGNOSIS — R4189 Other symptoms and signs involving cognitive functions and awareness: Secondary | ICD-10-CM

## 2017-11-17 DIAGNOSIS — R0989 Other specified symptoms and signs involving the circulatory and respiratory systems: Secondary | ICD-10-CM | POA: Diagnosis not present

## 2017-11-17 DIAGNOSIS — R0609 Other forms of dyspnea: Secondary | ICD-10-CM | POA: Diagnosis not present

## 2017-11-17 DIAGNOSIS — I10 Essential (primary) hypertension: Secondary | ICD-10-CM | POA: Diagnosis not present

## 2017-11-17 DIAGNOSIS — Z87891 Personal history of nicotine dependence: Secondary | ICD-10-CM | POA: Diagnosis not present

## 2017-11-17 DIAGNOSIS — F0390 Unspecified dementia without behavioral disturbance: Secondary | ICD-10-CM | POA: Insufficient documentation

## 2017-11-17 DIAGNOSIS — M79662 Pain in left lower leg: Secondary | ICD-10-CM | POA: Diagnosis not present

## 2017-11-17 DIAGNOSIS — E860 Dehydration: Secondary | ICD-10-CM | POA: Diagnosis not present

## 2017-11-17 DIAGNOSIS — R000 Tachycardia, unspecified: Secondary | ICD-10-CM

## 2017-11-17 DIAGNOSIS — J3489 Other specified disorders of nose and nasal sinuses: Secondary | ICD-10-CM

## 2017-11-17 DIAGNOSIS — N179 Acute kidney failure, unspecified: Secondary | ICD-10-CM

## 2017-11-17 DIAGNOSIS — I5032 Chronic diastolic (congestive) heart failure: Secondary | ICD-10-CM | POA: Diagnosis not present

## 2017-11-17 LAB — COMPLETE METABOLIC PANEL WITH GFR
AG Ratio: 1.8 (calc) (ref 1.0–2.5)
ALKALINE PHOSPHATASE (APISO): 56 U/L (ref 33–130)
ALT: 15 U/L (ref 6–29)
AST: 17 U/L (ref 10–35)
Albumin: 4.7 g/dL (ref 3.6–5.1)
BUN/Creatinine Ratio: 21 (calc) (ref 6–22)
BUN: 28 mg/dL — ABNORMAL HIGH (ref 7–25)
CHLORIDE: 101 mmol/L (ref 98–110)
CO2: 30 mmol/L (ref 20–32)
Calcium: 11.1 mg/dL — ABNORMAL HIGH (ref 8.6–10.4)
Creat: 1.36 mg/dL — ABNORMAL HIGH (ref 0.60–0.88)
GFR, Est African American: 40 mL/min/{1.73_m2} — ABNORMAL LOW (ref >=60)
GFR, Est Non African American: 34 mL/min/{1.73_m2} — ABNORMAL LOW (ref >=60)
GLOBULIN: 2.6 g/dL (ref 1.9–3.7)
GLUCOSE: 134 mg/dL (ref 65–139)
POTASSIUM: 4.7 mmol/L (ref 3.5–5.3)
Sodium: 142 mmol/L (ref 135–146)
Total Bilirubin: 0.4 mg/dL (ref 0.2–1.2)
Total Protein: 7.3 g/dL (ref 6.1–8.1)

## 2017-11-17 LAB — CBC WITH DIFFERENTIAL/PLATELET
BASOS PCT: 0.5 %
Basophils Absolute: 39 cells/uL (ref 0–200)
EOS ABS: 31 {cells}/uL (ref 15–500)
Eosinophils Relative: 0.4 %
HCT: 39.3 % (ref 35.0–45.0)
HEMOGLOBIN: 13.7 g/dL (ref 11.7–15.5)
Lymphs Abs: 2841 cells/uL (ref 850–3900)
MCH: 30.2 pg (ref 27.0–33.0)
MCHC: 34.9 g/dL (ref 32.0–36.0)
MCV: 86.6 fL (ref 80.0–100.0)
MONOS PCT: 8.2 %
MPV: 11 fL (ref 7.5–12.5)
NEUTROS ABS: 4158 {cells}/uL (ref 1500–7800)
Neutrophils Relative %: 54 %
Platelets: 274 10*3/uL (ref 140–400)
RBC: 4.54 10*6/uL (ref 3.80–5.10)
RDW: 12.7 % (ref 11.0–15.0)
Total Lymphocyte: 36.9 %
WBC: 7.7 10*3/uL (ref 3.8–10.8)
WBCMIX: 631 {cells}/uL (ref 200–950)

## 2017-11-17 LAB — URINALYSIS, ROUTINE W REFLEX MICROSCOPIC
BILIRUBIN URINE: NEGATIVE
GLUCOSE, UA: NEGATIVE mg/dL
Hgb urine dipstick: NEGATIVE
KETONES UR: 5 mg/dL — AB
NITRITE: NEGATIVE
PH: 5 (ref 5.0–8.0)
Protein, ur: NEGATIVE mg/dL
Specific Gravity, Urine: 1.021 (ref 1.005–1.030)

## 2017-11-17 LAB — CBC
HEMATOCRIT: 40.2 % (ref 36.0–46.0)
HEMOGLOBIN: 13.4 g/dL (ref 12.0–15.0)
MCH: 30.4 pg (ref 26.0–34.0)
MCHC: 33.3 g/dL (ref 30.0–36.0)
MCV: 91.2 fL (ref 78.0–100.0)
Platelets: 229 10*3/uL (ref 150–400)
RBC: 4.41 MIL/uL (ref 3.87–5.11)
RDW: 13 % (ref 11.5–15.5)
WBC: 8 10*3/uL (ref 4.0–10.5)

## 2017-11-17 LAB — I-STAT TROPONIN, ED: Troponin i, poc: 0.02 ng/mL (ref 0.00–0.08)

## 2017-11-17 LAB — LIPID PANEL
Cholesterol: 236 mg/dL — ABNORMAL HIGH (ref <200)
HDL: 77 mg/dL (ref >50)
LDL CHOLESTEROL (CALC): 131 mg/dL — AB
Non-HDL Cholesterol (Calc): 159 mg/dL (calc) — ABNORMAL HIGH (ref <130)
Total CHOL/HDL Ratio: 3.1 (calc) (ref <5.0)
Triglycerides: 167 mg/dL — ABNORMAL HIGH (ref <150)

## 2017-11-17 LAB — BASIC METABOLIC PANEL
ANION GAP: 14 (ref 5–15)
BUN: 26 mg/dL — ABNORMAL HIGH (ref 8–23)
CO2: 27 mmol/L (ref 22–32)
Calcium: 10.2 mg/dL (ref 8.9–10.3)
Chloride: 102 mmol/L (ref 98–111)
Creatinine, Ser: 1.45 mg/dL — ABNORMAL HIGH (ref 0.44–1.00)
GFR calc Af Amer: 36 mL/min — ABNORMAL LOW (ref >60)
GFR calc non Af Amer: 31 mL/min — ABNORMAL LOW (ref >60)
GLUCOSE: 148 mg/dL — AB (ref 70–99)
POTASSIUM: 4.5 mmol/L (ref 3.5–5.1)
Sodium: 143 mmol/L (ref 135–145)

## 2017-11-17 LAB — TSH: TSH: 2.49 m[IU]/L (ref 0.40–4.50)

## 2017-11-17 LAB — BRAIN NATRIURETIC PEPTIDE: B Natriuretic Peptide: 38 pg/mL (ref 0.0–100.0)

## 2017-11-17 MED ORDER — DONEPEZIL HCL 5 MG PO TABS: 5.0000 mg | ORAL_TABLET | Freq: Every day | ORAL | 2 refills | Status: DC

## 2017-11-17 MED ORDER — LACTATED RINGERS IV BOLUS
1000.0000 mL | Freq: Once | INTRAVENOUS | Status: AC
  Administered 2017-11-17: 1000 mL via INTRAVENOUS

## 2017-11-17 MED ORDER — SIMVASTATIN 20 MG PO TABS: 20.0000 mg | ORAL_TABLET | Freq: Every day | ORAL | 2 refills | Status: DC

## 2017-11-17 MED ORDER — FUROSEMIDE 40 MG PO TABS: ORAL_TABLET | ORAL | 3 refills | Status: DC

## 2017-11-17 MED ORDER — BISOPROLOL FUMARATE 5 MG PO TABS: 5.0000 mg | ORAL_TABLET | Freq: Every day | ORAL | 2 refills | Status: DC

## 2017-11-17 MED ORDER — AMLODIPINE BESY-BENAZEPRIL HCL 10-20 MG PO CAPS: 1.0000 | ORAL_CAPSULE | Freq: Every day | ORAL | 2 refills | Status: DC

## 2017-11-17 NOTE — Telephone Encounter (Signed)
error 

## 2017-11-17 NOTE — Progress Notes (Signed)
LLE venous duplex prelim: negative for DVT. Kelden Lavallee Eunice, RDMS, RVT  

## 2017-11-17 NOTE — Progress Notes (Signed)
Patient ID: [name] Morton, female   DOB: 04-30-1926, 84 y.o.   MRN: [id number]   [facility] OFFICE  Provider: DR Arletha Grippe  Code Status: Goals of Care:  Advanced Directives 07/20/2016  Does Patient Have a Medical Advance Directive? Yes  Type of Advance Directive Living will  Does patient want to make changes to medical advance directive? No - Patient declined  Copy of [place] in Chart? -  Would patient like information on creating a medical advance directive? -  Pre-existing out of facility DNR order (yellow form or pink MOST form) -     Chief Complaint  Patient presents with  . Acute Visit    SOB related to fall on Friday. Patient with bruise on left lower leg and left elbow. Patient also c/o constant runny nose and questions if related to any medications. Here with son-n-law Elta [place]. Patient's daughter was called to verfiy medications and refills needed.   . Medication Management    Discuss Alternative to Trazodone, ineffective   . Immunizations    Question if ok to get flu vaccine today   . Medication Refill    Refill Simvastatin, furoseminde, Aricept, Zebeta, and Lotrel to [facility]     HPI: Patient is a 84 y.o. female seen today for an acute visit for SOB s/p fall on 11/12/17 onto desk at home in the dark. She fell onto left side and ended up with bruising on left leg. Since then, she has had trouble breathing with exertion. She did not seek medical attention. No CP, palpitations, N/V, HA, dizziness, loss of bowel/bladder habits. No f/c. She has noticed that her memory is worse since her fall. She is a poor historian due to dementia. Hx obtained from chart and son-in-law. Weight down 5 lbs since last ov.  She also c/o rhinorrhea  Past Medical History:  Diagnosis Date  . Breast cancer ([place]) 1997   right - tx with lumpectomty & radiation  . Burning sensation of feet    Per New Patient Packet-PSC   . Chronic anxiety   . Chronic kidney disease    Per  New Patient Packet-PSC   . Cognitive impairment    Per New Patient Packet-PSC   . Diverticulosis   . Dyslipidemia   . Fluid retention    Per New Patient Packet-PSC   . Gait difficulty   . GERD (gastroesophageal reflux disease)    RARE - NO MEDICATIONS  . Hearing impairment    BILATERAL HEARING AIDS  . Heart murmur    Per New Patient Packet-PSC   . High cholesterol    Per New Patient Packet-PSC   . History of nuclear stress test 11/2009   dipyridamole; no evidence of ischemia, normal, low risk   . History of shingles 1998   NO RESIDUAL PROBLEMS  . Hypertension    SEES CARDIOLOGIST DR. DAVID HARDING FOR HYPERTENSION   . Insomnia   . Lymphedema of arm    RIGHT   . Mitral and aortic regurgitation   . OA (osteoarthritis)    OA AND PAIN LEFT KNEE  . Osteopenia   . Scoliosis    Per New Patient Packet-PSC   . Shortness of breath    WHEN CLIMBING STEPS AT MY HOME--OTHERWISE OK  . Sinus problem   . Sleep difficulties    ALWAYS HAD SLEEPING PROBLEMS - MEDICATION HELPS PT SLEEP ABOUT 3 HOURS  . SOB (shortness of breath)    Per New Patient Packet-PSC, hospitalized  2018     Past Surgical History:  Procedure Laterality Date  . ABDOMINAL HYSTERECTOMY  1973  . APPENDECTOMY  1951  . BREAST ADENOMA  1950   EXCISED  . BREAST LUMPECTOMY Right 1997  . COLONOSCOPY  2014   Dr.Sam Penelope Coop, Per New Patient Packet-PSC   . EYE SURGERY     BILATERAL CATARACT EXTRACTIONS AND LENS IMPLANTS  . NM MYOVIEW LTD  08/2016   Hyperdynamic LV function EF 65%. No ischemia or infarction. LOW RISK  . REPLACEMENT TOTAL KNEE  2002  . ROTATOR CUFF REPAIR Right 12/2009   Dr. Lenna Sciara. [place]   . TOTAL KNEE ARTHROPLASTY Left 05/01/2013   Procedure: LEFT TOTAL KNEE ARTHROPLASTY;  Surgeon: Gearlean Alf, MD;  Location: [facility];  Service: Orthopedics;  Laterality: Left;  . TRANSTHORACIC ECHOCARDIOGRAM  05/1994   trace mitral/tricuspic/aortic inusff; hyperkinetic LV  . TRANSTHORACIC ECHOCARDIOGRAM  08/2016   Normal  LV size. EF 65-70%. GR 1 DD. (Normal for age). Mild aortic stenosis     reports that she has quit smoking. She quit after 5.00 years of use. She has never used smokeless tobacco. She reports that she does not drink alcohol or use drugs. Social History   Socioeconomic History  . Marital status: Widowed    Spouse name: Not on file  . Number of children: 2  . Years of education: Not on file  . Highest education level: Not on file  Occupational History  . Occupation: retired Animal nutritionist  . Financial resource strain: Not on file  . Food insecurity:    Worry: Not on file    Inability: Not on file  . Transportation needs:    Medical: Not on file    Non-medical: Not on file  Tobacco Use  . Smoking status: Former Smoker    Years: 5.00  . Smokeless tobacco: Never Used  Substance and Sexual Activity  . Alcohol use: No  . Drug use: No  . Sexual activity: Not on file  Lifestyle  . Physical activity:    Days per week: Not on file    Minutes per session: Not on file  . Stress: Not on file  Relationships  . Social connections:    Talks on phone: Not on file    Gets together: Not on file    Attends religious service: Not on file    Active member of club or organization: Not on file    Attends meetings of clubs or organizations: Not on file    Relationship status: Not on file  . Intimate partner violence:    Fear of current or ex partner: Not on file    Emotionally abused: Not on file    Physically abused: Not on file    Forced sexual activity: Not on file  Other Topics Concern  . Not on file  Social History Narrative   Widowed since 11/2010.  Mother of 2.  No real exercise due to knee.   Smoki: No   EtOH: NO      As of 07/30/17-PSC:   Diet: N/A      Caffeine: Coffee, Dr.Pepper      Married, if yes what year: Widowed      Do you live in a house, apartment, assisted living, condo, trailer, ect: House, more than 1 stories, split level 1 person       Pets: No        Highest Level of education: Nursing School in [place], [place]  Current/Past profession: RN      Exercise: No         Living Will: Yes   DNR: No   POA/HPOA: Yes      Functional Status:   Do you have difficulty bathing or dressing yourself? No   Do you have difficulty preparing food or eating? Yes   Do you have difficulty managing your medications? Yes   Do you have difficulty managing your finances? Yes   Do you have difficulty affording your medications? Yes    Family History  Problem Relation Age of Onset  . Heart attack Mother   . Arthritis Mother   . Dementia Mother   . Heart Problems Father   . Stroke Father   . Heart Problems Maternal Grandmother   . Heart Problems Maternal Grandfather   . Asthma Maternal Grandfather   . Heart attack Brother 36  . Heart disease Brother        CABG  . Lung cancer Sister   . Emphysema Sister   . Cancer Sister   . Narcolepsy Child   . Arthritis Child   . High blood pressure Child   . Alcoholism Brother   . Post-traumatic stress disorder Brother   . COPD Sister   . Seizures Sister   . Heart Problems Sister     Allergies  Allergen Reactions  . Benadryl [Diphenhydramine Hcl (Sleep)]     Makes me hyper   . Cardizem [Diltiazem Hcl]     Doesn't remember. Dr Rex Kras told patient not to take.   [name] Kitchen Ceftin [Cefuroxime Axetil] Other (See Comments)    Stomach Issues   . Epinephrine     unknown  . Oxycodone Hcl     Loss of appetite: sores on tongue and mouth  . Tetanus Toxoids   . Tramadol Hcl     Loss of appetite: dry mouth/sores on tongue and mouth  . Vistaril [Hydroxyzine]     unknown  . Vytorin [Ezetimibe-Simvastatin]     Not comfortable taking it. Doesn't remember reaction.     Outpatient Encounter Medications as of 11/17/2017  Medication Sig  . acetaminophen (TYLENOL) 500 MG tablet Take 500 mg by mouth every 6 (six) hours as needed for moderate pain.  [name] Kitchen amLODipine-benazepril (LOTREL) 10-20 MG capsule TAKE 1 CAPSULE DAILY  .  bisoprolol (ZEBETA) 5 MG tablet Take 1 tablet (5 mg total) by mouth daily.  . Calcium Carb-Cholecalciferol (CALCIUM 600+D3) 600-800 MG-UNIT TABS Take by mouth daily.  [name] Kitchen donepezil (ARICEPT) 5 MG tablet Take 1 tablet (5 mg total) by mouth at bedtime. For memory loss  . furosemide (LASIX) 40 MG tablet TAKE 40 MG BY MOUTH DAILY AND MAY TAKE AN EXTRA 20 MG ( 1/2 TABLET) IF NEEDED FOR PUFFINESS  . gabapentin (NEURONTIN) 100 MG capsule Take 1 capsule (100 mg total) by mouth at bedtime.  . Liniments (SALONPAS EX) Apply topically.  . Melatonin 5 MG TABS Take by mouth at bedtime.  . Multiple Vitamins-Minerals (MULTIVITAMIN WOMEN 50+) TABS Take by mouth daily.  . polyethylene glycol (MIRALAX / GLYCOLAX) packet Take 17 g by mouth daily as needed.   . simvastatin (ZOCOR) 20 MG tablet Take 1 tablet (20 mg total) by mouth at bedtime.  . traZODone (DESYREL) 100 MG tablet Take 1 tablet by mouth at bedtime as needed.   [name] Kitchen UNABLE TO FIND Med Name: Pain relieving foot cream and relaxing leg cream, apply to legs and feet as needed for burning sensation in feet at night  No facility-administered encounter medications on file as of 11/17/2017.     Review of Systems:  Review of Systems  Unable to perform ROS: Other (memory loss)    Health Maintenance  Topic Date Due  . DEXA SCAN  08/07/1993  . TETANUS/TDAP  01/28/2012  . INFLUENZA VACCINE  09/30/2017  . PNA vac Low Risk Adult  Completed    Physical Exam: Vitals:   11/17/17 1110  BP: 124/70  Pulse: (!) 116  Temp: 98.2 F (36.8 C)  TempSrc: Oral  SpO2: 94%  Weight: 137 lb (62.1 kg)  Height: 5' (1.524 m)   Body mass index is 26.76 kg/m. Physical Exam  Constitutional: She appears well-developed and well-nourished.  Looks ill in NAD, sitting in chair; no conversational dyspnea  HENT:  Mouth/Throat: Oropharynx is clear and moist. No oropharyngeal exudate.  No sinus TTP; oropharynx cobblestoning but no redness or exudate  Eyes: Pupils are equal,  round, and reactive to light. No scleral icterus.  Neck: Neck supple. Carotid bruit is not present. No tracheal deviation present. No thyromegaly present.  Cardiovascular: Regular rhythm, normal heart sounds and intact distal pulses. Tachycardia present. Exam reveals no gallop and no friction rub.  No murmur heard. HR 124 bpm; no BLE edema; no right calf TTP; (+) left Homan's sign with TTP lateral calf with no distinct palpable cord; chronic RUE lymphedema;   Pulmonary/Chest: Effort normal. No stridor. No respiratory distress. She has decreased breath sounds (R>L base). She has no wheezes. She has no rhonchi. She has no rales.  Abdominal: Soft. Normal appearance and bowel sounds are normal. She exhibits no distension and no mass. There is no hepatomegaly. There is no tenderness. There is no rigidity, no rebound and no guarding. No hernia.    Musculoskeletal: She exhibits edema and tenderness.  Lymphadenopathy:    She has no cervical adenopathy.  Neurological: She is alert. She has normal reflexes.  Skin: Skin is warm and dry. No rash noted.  Contusions left lateral leg (yellow); abdomen; lower left thigh  Psychiatric: Her behavior is normal. Thought content normal. Her mood appears anxious.    Labs reviewed: Basic Metabolic Panel: No results for input(s): NA, K, CL, CO2, GLUCOSE, BUN, CREATININE, CALCIUM, MG, PHOS, TSH in the last 8760 hours. Liver Function Tests: No results for input(s): AST, ALT, ALKPHOS, BILITOT, PROT, ALBUMIN in the last 8760 hours. No results for input(s): LIPASE, AMYLASE in the last 8760 hours. No results for input(s): AMMONIA in the last 8760 hours. CBC: No results for input(s): WBC, NEUTROABS, HGB, HCT, MCV, PLT in the last 8760 hours. Lipid Panel: No results for input(s): CHOL, HDL, LDLCALC, TRIG, CHOLHDL, LDLDIRECT in the last 8760 hours. No results found for: HGBA1C  Procedures since last visit: No results found.  Assessment/Plan   ICD-10-CM   1.  Tachycardia R00.0 VAS [place] LOWER EXTREMITY VENOUS (DVT)    CMP with eGFR([facility])  2. Pain of left calf M79.662 VAS [place] LOWER EXTREMITY VENOUS (DVT)  3. Rhinorrhea J34.89   4. DOE (dyspnea on exertion) R06.09 CMP with eGFR([facility])  5. Cognitive impairment R41.89 donepezil (ARICEPT) 5 MG tablet  6. Diastolic dysfunction with chronic heart failure (HCC) I50.32 CT ANGIO CHEST PE W OR WO CONTRAST  7. Shortness of breath R06.02 CT ANGIO CHEST PE W OR WO CONTRAST  8. Homans sign present R09.89 VAS [place] LOWER EXTREMITY VENOUS (DVT)   Will call with imaging/lab results  NO FLU SHOT TODAY - NEED TO DIAGNOSE CURRENT PROBLEM FIRST  Continue  current medications as ordered  Follow up in 2 weeks for routine visit or sooner if need be.  Corine Solorio S. Perlie Gold  [facility] [address] [place], [place] [address] [phone] Cell (Monday-Friday 8 AM - 5 PM) [phone] After 5 PM and follow prompts

## 2017-11-17 NOTE — ED Provider Notes (Signed)
Emergency Department Provider Note   I have reviewed the triage vital signs and the nursing notes.   HISTORY  Chief Complaint Abnormal Lab   HPI [name] Morton is a 84 y.o. female with multiple medical problems listed below who presents to ER with concern for high calcium and abnormal kidney function.  Patient has been increasingly weak and actually had a fall last week and she bruised her left leg with swelling and bruise to her ribs and was having some dyspnea and progressive weakness so some to see her primary doctor who thought she might have a DVT or pulmonary embolism check labs.  Does sound like the calcium was elevated and kidney function was a little bit diminished so was sent here for further evaluation with her tachycardia.  Patient states that she only urinates once or twice a day and only drinks less than 20 ounces of water a day.  Does not eat much because of the increasing weakness.  No pain aside from her ribs and leg. No other associated or modifying symptoms.    Past Medical History:  Diagnosis Date  . Breast cancer ([place]) 1997   right - tx with lumpectomty & radiation  . Burning sensation of feet    Per New Patient Packet-PSC   . Chronic anxiety   . Chronic kidney disease    Per New Patient Packet-PSC   . Cognitive impairment    Per New Patient Packet-PSC   . Diverticulosis   . Dyslipidemia   . Fluid retention    Per New Patient Packet-PSC   . Gait difficulty   . GERD (gastroesophageal reflux disease)    RARE - NO MEDICATIONS  . Hearing impairment    BILATERAL HEARING AIDS  . Heart murmur    Per New Patient Packet-PSC   . High cholesterol    Per New Patient Packet-PSC   . History of nuclear stress test 11/2009   dipyridamole; no evidence of ischemia, normal, low risk   . History of shingles 1998   NO RESIDUAL PROBLEMS  . Hypertension    SEES CARDIOLOGIST DR. DAVID HARDING FOR HYPERTENSION   . Insomnia   . Lymphedema of arm    RIGHT   .  Mitral and aortic regurgitation   . OA (osteoarthritis)    OA AND PAIN LEFT KNEE  . Osteopenia   . Scoliosis    Per New Patient Packet-PSC   . Shortness of breath    WHEN CLIMBING STEPS AT MY HOME--OTHERWISE OK  . Sinus problem   . Sleep difficulties    ALWAYS HAD SLEEPING PROBLEMS - MEDICATION HELPS PT SLEEP ABOUT 3 HOURS  . SOB (shortness of breath)    Per New Patient Packet-PSC, hospitalized 2018     Patient Active Problem List   Diagnosis Date Noted  . Mild dementia 08/24/2017  . Neuropathy 08/24/2017  . Syncope and collapse 10/09/2016  . Exertional chest tightness 08/02/2016  . Exertional dyspnea 07/31/2016  . Aortic systolic murmur on examination 07/31/2016  . Fatigue 07/01/2016  . Acute blood loss anemia 04/02/2014  . Colitis 04/01/2014  . Diastolic dysfunction with chronic heart failure ([place]) 04/01/2014  . GERD (gastroesophageal reflux disease) 05/02/2013  . Diverticulosis of large intestine 05/02/2013  . OA (osteoarthritis) of knee 05/01/2013  . Arm edema - right 12/29/2012  . Bilateral leg edema 12/29/2012    Class: History of  . Essential hypertension   . Dyslipidemia, goal LDL below 100     Past Surgical  History:  Procedure Laterality Date  . ABDOMINAL HYSTERECTOMY  1973  . APPENDECTOMY  1951  . BREAST ADENOMA  1950   EXCISED  . BREAST LUMPECTOMY Right 1997  . COLONOSCOPY  2014   Dr.Sam Penelope Coop, Per New Patient Packet-PSC   . EYE SURGERY     BILATERAL CATARACT EXTRACTIONS AND LENS IMPLANTS  . NM MYOVIEW LTD  08/2016   Hyperdynamic LV function EF 65%. No ischemia or infarction. LOW RISK  . REPLACEMENT TOTAL KNEE  2002  . ROTATOR CUFF REPAIR Right 12/2009   Dr. Lenna Sciara. [place]   . TOTAL KNEE ARTHROPLASTY Left 05/01/2013   Procedure: LEFT TOTAL KNEE ARTHROPLASTY;  Surgeon: Gearlean Alf, MD;  Location: [facility];  Service: Orthopedics;  Laterality: Left;  . TRANSTHORACIC ECHOCARDIOGRAM  05/1994   trace mitral/tricuspic/aortic inusff; hyperkinetic LV  .  TRANSTHORACIC ECHOCARDIOGRAM  08/2016   Normal LV size. EF 65-70%. GR 1 DD. (Normal for age). Mild aortic stenosis    Current Outpatient Rx  . Order #: [id number] Class: Historical Med  . Order #: [id number] Class: Historical Med  . Order #: [id number] Class: Normal  . Order #: [id number] Class: Normal  . Order #: [id number] Class: Historical Med  . Order #: [id number] Class: Normal  . Order #: [id number] Class: Normal  . Order #: [id number] Class: Normal  . Order #: [id number] Class: Historical Med  . Order #: [id number] Class: Historical Med  . Order #: [id number] Class: Historical Med  . Order #: [id number] Class: Normal    Allergies Benadryl [diphenhydramine hcl (sleep)]; Cardizem [diltiazem hcl]; Ceftin [cefuroxime axetil]; Epinephrine; Oxycodone hcl; Tetanus toxoids; Tramadol hcl; Vistaril [hydroxyzine]; and Vytorin [ezetimibe-simvastatin]  Family History  Problem Relation Age of Onset  . Heart attack Mother   . Arthritis Mother   . Dementia Mother   . Heart Problems Father   . Stroke Father   . Heart Problems Maternal Grandmother   . Heart Problems Maternal Grandfather   . Asthma Maternal Grandfather   . Heart attack Brother 61  . Heart disease Brother        CABG  . Lung cancer Sister   . Emphysema Sister   . Cancer Sister   . Narcolepsy Child   . Arthritis Child   . High blood pressure Child   . Alcoholism Brother   . Post-traumatic stress disorder Brother   . COPD Sister   . Seizures Sister   . Heart Problems Sister     Social History Social History   Tobacco Use  . Smoking status: Former Smoker    Years: 5.00  . Smokeless tobacco: Never Used  Substance Use Topics  . Alcohol use: No  . Drug use: No    Review of Systems  All other systems negative except as documented in the HPI. All pertinent positives and negatives as reviewed in the HPI. ____________________________________________   PHYSICAL EXAM:  VITAL SIGNS: ED Triage Vitals  Enc Vitals Group     BP  11/17/17 1553 (!) 137/57     Pulse Rate 11/17/17 1553 (!) 112     Resp 11/17/17 1553 18     Temp 11/17/17 1553 97.7 F (36.5 C)     Temp Source 11/17/17 1553 Oral     SpO2 11/17/17 1553 98 %    Constitutional: Alert and oriented. Well appearing and in no acute distress. Eyes: Conjunctivae are normal. PERRL. EOMI. Head: Atraumatic. Nose: No congestion/rhinnorhea. Mouth/Throat: Mucous membranes are moist.  Oropharynx non-erythematous. Neck: No stridor.  No meningeal signs.   Cardiovascular: tachycardic rate, regular rhythm. Good  peripheral circulation. Grossly normal heart sounds.   Respiratory: Normal respiratory effort.  No retractions. Lungs CTAB. Gastrointestinal: Soft and nontender. No distention.  Musculoskeletal: No lower extremity tenderness nor edema. No gross deformities of extremities. Neurologic:  Normal speech and language. No gross focal neurologic deficits are appreciated.  Skin:  Skin is warm, dry and intact. Ecchymosis and pain to left leg with mild swelling.    ____________________________________________   LABS (all labs ordered are listed, but only abnormal results are displayed)  Labs Reviewed  BASIC METABOLIC PANEL - Abnormal; Notable for the following components:      Result Value   Glucose, Bld 148 (*)    BUN 26 (*)    Creatinine, Ser 1.45 (*)    GFR calc non Af Amer 31 (*)    GFR calc Af Amer 36 (*)    All other components within normal limits  URINALYSIS, ROUTINE W REFLEX MICROSCOPIC - Abnormal; Notable for the following components:   APPearance HAZY (*)    Ketones, ur 5 (*)    Leukocytes, UA LARGE (*)    WBC, UA >50 (*)    Bacteria, UA FEW (*)    Non Squamous Epithelial 0-5 (*)    All other components within normal limits  CBC  BRAIN NATRIURETIC PEPTIDE  I-STAT TROPONIN, ED   ____________________________________________  RADIOLOGY  No results found.  ____________________________________________   PROCEDURES  Procedure(s) performed:     Procedures   ____________________________________________   INITIAL IMPRESSION / ASSESSMENT AND PLAN / ED COURSE  Labs and work-up consistent with likely dehydration.  DVT study done on left leg per physician request and negative.  I suspect her heart rate is up for dehydration rather than a primary cardiac or pulmonary cause however these are still possible but low likelihood at this time.  I think her chest pain is from falling as it is in the exact area where she hit her ribs.  Calcium is fine.  I suspect that with the fluids her kidney function is probably improved and she will increase her fluid intake at home rather than stay in the hospital for more IV fluids.  Her son-in-law is with her and will help ensure this happens.  Patient stable for discharge with PCP follow-up at this time.  Pertinent labs & imaging results that were available during my care of the patient were reviewed by me and considered in my medical decision making (see chart for details).  ____________________________________________  FINAL CLINICAL IMPRESSION(S) / ED DIAGNOSES  Final diagnoses:  AKI (acute kidney injury) ([place])  Dehydration     MEDICATIONS GIVEN DURING THIS VISIT:  Medications  lactated ringers bolus 1,000 mL (0 mLs Intravenous Stopped 11/17/17 2236)     NEW OUTPATIENT MEDICATIONS STARTED DURING THIS VISIT:  Discharge Medication List as of 11/17/2017 10:17 PM      Note:  This note was prepared with assistance of Dragon voice recognition software. Occasional wrong-word or sound-a-like substitutions may have occurred due to the inherent limitations of voice recognition software.   Merrily Pew, MD 11/18/17 2020

## 2017-11-17 NOTE — ED Triage Notes (Signed)
Pt presents for evaluation of abnormal labs from PCP. States was seen last week regarding feeling weak and having some lower leg pain that they felt may be related to a blood clot. Told to come back for eval today related to elevated calcium.

## 2017-11-17 NOTE — Patient Instructions (Addendum)
Will call with imaging/lab results  NO FLU SHOT TODAY - NEED TO DIAGNOSE CURRENT PROBLEM FIRST  Continue current medications as ordered  Follow up in 2 weeks for routine visit or sooner if need be.

## 2017-11-17 NOTE — ED Provider Notes (Signed)
Patient placed in Quick Look pathway, seen and evaluated   Chief Complaint: Shortness of Breath  HPI: Patient presents per recommendation of her PCP due to abnormal lab work. She had labs drawn today, calcium elevated at 11.1. Creatinine elevated at 1.36. She was on her way to get a doppler US to evaluate for DVT of her left leg, but PCP sent her here before that study was done.   Patient states that she was seen by her PCP today d/t shortness of breath ever since she had a mechanical fall 5 days ago.  Patient states that it was dark inside her house and she accidentally tripped over a vacuum cleaner and fell on her left side against a desk.  She denies hitting her head or loss of consciousness.  She lives alone.  States that ever since she has felt short of breath, only with exertion.  She has bruising on her left shin from the fall and her PCP reported left leg swelling today which was why she ordered labs and ultrasound study. Patient denies cough, fever, chest pain, hemoptysis. No hx of PE or DVT.   ROS: No cough  Physical Exam:   Gen: No distress  Neuro: Awake and Alert  Skin: Warm    Focused Exam: Lungs CTA. She is tachycardic, regular rhythm. Systolic grade 3/6 blowing murmur.    Initiation of care has begun. The patient has been counseled on the process, plan, and necessity for staying for the completion/evaluation, and the remainder of the medical screening examination    [name] Morton 11/17/17 1628    Fredia Sorrow, MD 11/17/17 1730

## 2017-11-23 ENCOUNTER — Ambulatory Visit: Payer: Self-pay

## 2017-11-29 ENCOUNTER — Ambulatory Visit (INDEPENDENT_AMBULATORY_CARE_PROVIDER_SITE_OTHER): Payer: Medicare Other

## 2017-11-29 ENCOUNTER — Telehealth: Payer: Self-pay

## 2017-11-29 DIAGNOSIS — Z23 Encounter for immunization: Secondary | ICD-10-CM | POA: Diagnosis not present

## 2017-11-29 NOTE — Telephone Encounter (Signed)
Patient dropped off a handicapp placard form for completion by Dr. Eulas Post. Form was placed in Dr. Vale Haven folder. Please call patient's daughter, [name] Morton, when form is complete and she will have it picked up.

## 2017-11-30 NOTE — Telephone Encounter (Signed)
Called Patient. No answer. Unable to leave a message. Left up front for pickup.

## 2017-12-02 NOTE — Telephone Encounter (Signed)
I left a message on daughter's [name]) voicemail informing her that form is complete and ready to be picked up. I asked that she call the office if she has any questions.

## 2018-01-11 ENCOUNTER — Encounter: Payer: Self-pay | Admitting: Nurse Practitioner

## 2018-01-11 ENCOUNTER — Ambulatory Visit (INDEPENDENT_AMBULATORY_CARE_PROVIDER_SITE_OTHER): Payer: Medicare Other | Admitting: Nurse Practitioner

## 2018-01-11 VITALS — BP 118/76 | HR 68 | Temp 97.8°F | Ht 60.0 in | Wt 142.0 lb

## 2018-01-11 DIAGNOSIS — M5442 Lumbago with sciatica, left side: Secondary | ICD-10-CM | POA: Diagnosis not present

## 2018-01-11 DIAGNOSIS — M5441 Lumbago with sciatica, right side: Secondary | ICD-10-CM | POA: Diagnosis not present

## 2018-01-11 DIAGNOSIS — H9190 Unspecified hearing loss, unspecified ear: Secondary | ICD-10-CM | POA: Diagnosis not present

## 2018-01-11 DIAGNOSIS — R4189 Other symptoms and signs involving cognitive functions and awareness: Secondary | ICD-10-CM | POA: Diagnosis not present

## 2018-01-11 DIAGNOSIS — G8929 Other chronic pain: Secondary | ICD-10-CM | POA: Diagnosis not present

## 2018-01-11 MED ORDER — GABAPENTIN 100 MG PO CAPS: 200.0000 mg | ORAL_CAPSULE | Freq: Every day | ORAL | 3 refills | Status: DC

## 2018-01-11 NOTE — Patient Instructions (Addendum)
To use heat to back twice daily-- not while salon pas patch is on To increase gabapentin to 200 mg by mouth at bedtime to help with back pain  Podiatrist referral for foot wear  Home health physical therapy due to back pain    Follow up in 1 month for AWV and physical (after AWV)

## 2018-01-11 NOTE — Progress Notes (Signed)
Careteam: Patient Care Team: [name], NP as PCP - General (Geriatric Medicine) Wonda Horner, MD as Consulting Physician (Gastroenterology) Gaynelle Arabian, MD as Consulting Physician (Orthopedic Surgery) Leonie Man, MD as Consulting Physician (Cardiology) Shon Hough, MD as Consulting Physician (Ophthalmology)  Advanced Directive information Does Patient Have a Medical Advance Directive?: Yes, Type of Advance Directive: Healthcare Power of Attorney  Allergies  Allergen Reactions  . Benadryl [Diphenhydramine Hcl (Sleep)]     Makes me hyper   . Cardizem [Diltiazem Hcl]     Doesn't remember. Dr Rex Kras told patient not to take.   [name] Kitchen Ceftin [Cefuroxime Axetil] Other (See Comments)    Stomach Issues   . Epinephrine Other (See Comments)    unknown  . Oxycodone Hcl     Loss of appetite: sores on tongue and mouth  . Tetanus Toxoids Other (See Comments)    Unknown   . Tramadol Hcl     Loss of appetite: dry mouth/sores on tongue and mouth  . Vistaril [Hydroxyzine] Other (See Comments)    unknown  . Vytorin [Ezetimibe-Simvastatin]     Not comfortable taking it. Doesn't remember reaction.     Chief Complaint  Patient presents with  . Acute Visit    Pt is being seen due to pain in both hips/lower back. Pt has been using salonpas patches but they are not helping.   . referral    Pt would like to have an audiology referral to provider in [place]     HPI: Patient is a 84 y.o. female seen in the office today for low back pain. Pt with hx of scoliosis. Reports it is different than normal.  Pain to lower back in buttock on right and left. Right side is worse.  8/10 occasionally. Reports right now pain is not bad. Can feel it but not an actual pain. Worse when she is walking up and down steps and bedroom upstairs. No loss of bowel or bladder. No altered gait. Lives in a spit level home. Using tylenol CR 1300 BID. And salon pas.   Wears shoes frequently with no  back and heels.   Sleep cycle is off- goes to sleep late and will sleep to 9-10.  Used to work night shift at times.   Memory loss- using aricept 5 mg by mouth daily at bedtime.   Fell up at the top of the steps, tripped over something in the floor. Had some bruising but no major injury   Review of Systems:  Review of Systems  Constitutional: Negative for chills and fever.  HENT: Positive for hearing loss.   Respiratory: Negative for shortness of breath.   Cardiovascular: Negative for chest pain, palpitations and leg swelling.  Gastrointestinal: Negative for abdominal pain, constipation and diarrhea.  Musculoskeletal: Positive for back pain, falls and myalgias.  Neurological: Negative for dizziness and headaches.  Psychiatric/Behavioral: Positive for memory loss. The patient has insomnia.     Past Medical History:  Diagnosis Date  . Breast cancer ([place]) 1997   right - tx with lumpectomty & radiation  . Burning sensation of feet    Per New Patient Packet-PSC   . Chronic anxiety   . Chronic kidney disease    Per New Patient Packet-PSC   . Cognitive impairment    Per New Patient Packet-PSC   . Diverticulosis   . Dyslipidemia   . Fluid retention    Per New Patient Packet-PSC   . Gait difficulty   .  GERD (gastroesophageal reflux disease)    RARE - NO MEDICATIONS  . Hearing impairment    BILATERAL HEARING AIDS  . Heart murmur    Per New Patient Packet-PSC   . High cholesterol    Per New Patient Packet-PSC   . History of nuclear stress test 11/2009   dipyridamole; no evidence of ischemia, normal, low risk   . History of shingles 1998   NO RESIDUAL PROBLEMS  . Hypertension    SEES CARDIOLOGIST DR. DAVID HARDING FOR HYPERTENSION   . Insomnia   . Lymphedema of arm    RIGHT   . Mitral and aortic regurgitation   . OA (osteoarthritis)    OA AND PAIN LEFT KNEE  . Osteopenia   . Scoliosis    Per New Patient Packet-PSC   . Shortness of breath    WHEN CLIMBING STEPS AT MY  HOME--OTHERWISE OK  . Sinus problem   . Sleep difficulties    ALWAYS HAD SLEEPING PROBLEMS - MEDICATION HELPS PT SLEEP ABOUT 3 HOURS  . SOB (shortness of breath)    Per New Patient Packet-PSC, hospitalized 2018    Past Surgical History:  Procedure Laterality Date  . ABDOMINAL HYSTERECTOMY  1973  . APPENDECTOMY  1951  . BREAST ADENOMA  1950   EXCISED  . BREAST LUMPECTOMY Right 1997  . COLONOSCOPY  2014   Dr.Sam Penelope Coop, Per New Patient Packet-PSC   . EYE SURGERY     BILATERAL CATARACT EXTRACTIONS AND LENS IMPLANTS  . NM MYOVIEW LTD  08/2016   Hyperdynamic LV function EF 65%. No ischemia or infarction. LOW RISK  . REPLACEMENT TOTAL KNEE  2002  . ROTATOR CUFF REPAIR Right 12/2009   Dr. Lenna Sciara. [place]   . TOTAL KNEE ARTHROPLASTY Left 05/01/2013   Procedure: LEFT TOTAL KNEE ARTHROPLASTY;  Surgeon: Gearlean Alf, MD;  Location: [facility];  Service: Orthopedics;  Laterality: Left;  . TRANSTHORACIC ECHOCARDIOGRAM  05/1994   trace mitral/tricuspic/aortic inusff; hyperkinetic LV  . TRANSTHORACIC ECHOCARDIOGRAM  08/2016   Normal LV size. EF 65-70%. GR 1 DD. (Normal for age). Mild aortic stenosis   Social History:   reports that she has quit smoking. She quit after 5.00 years of use. She has never used smokeless tobacco. She reports that she does not drink alcohol or use drugs.  Family History  Problem Relation Age of Onset  . Heart attack Mother   . Arthritis Mother   . Dementia Mother   . Heart Problems Father   . Stroke Father   . Heart Problems Maternal Grandmother   . Heart Problems Maternal Grandfather   . Asthma Maternal Grandfather   . Heart attack Brother 39  . Heart disease Brother        CABG  . Lung cancer Sister   . Emphysema Sister   . Cancer Sister   . Narcolepsy Child   . Arthritis Child   . High blood pressure Child   . Alcoholism Brother   . Post-traumatic stress disorder Brother   . COPD Sister   . Seizures Sister   . Heart Problems Sister      Medications: Patient's Medications  New Prescriptions   No medications on file  Previous Medications   ACETAMINOPHEN (TYLENOL) 325 MG TABLET    Take 325 mg by mouth daily as needed.   ACETAMINOPHEN (TYLENOL) 650 MG CR TABLET    Take 1,300 mg by mouth 2 (two) times daily.    AMLODIPINE-BENAZEPRIL (LOTREL) 10-20 MG CAPSULE  Take 1 capsule by mouth daily.   BISOPROLOL (ZEBETA) 5 MG TABLET    Take 1 tablet (5 mg total) by mouth daily.   CALCIUM CARB-CHOLECALCIFEROL (CALCIUM 600+D3) 600-800 MG-UNIT TABS    Take 1 tablet by mouth daily.    DONEPEZIL (ARICEPT) 5 MG TABLET    Take 1 tablet (5 mg total) by mouth at bedtime. For memory loss   FUROSEMIDE (LASIX) 40 MG TABLET    TAKE 40 MG BY MOUTH DAILY AND MAY TAKE AN EXTRA 20 MG ( 1/2 TABLET) IF NEEDED FOR PUFFINESS   GABAPENTIN (NEURONTIN) 100 MG CAPSULE    Take 1 capsule (100 mg total) by mouth at bedtime.   LINIMENTS (SALONPAS EX)    Apply 1 application topically as needed (back pain).    MELATONIN 5 MG TABS    Take by mouth at bedtime.   MULTIPLE VITAMINS-MINERALS (MULTIVITAMIN WOMEN 50+) TABS    Take by mouth daily.   SIMVASTATIN (ZOCOR) 20 MG TABLET    Take 1 tablet (20 mg total) by mouth at bedtime.  Modified Medications   No medications on file  Discontinued Medications   ACETAMINOPHEN (TYLENOL) 500 MG TABLET    Take 500 mg by mouth daily.      Physical Exam:  Vitals:   01/11/18 1446  BP: 118/76  Pulse: 68  Temp: 97.8 F (36.6 C)  TempSrc: Oral  SpO2: 96%  Weight: 142 lb (64.4 kg)  Height: 5' (1.524 m)   Body mass index is 27.73 kg/m.  Physical Exam  Constitutional: She appears well-developed and well-nourished.  HENT:  Head: Normocephalic and atraumatic.  Cardiovascular: Normal rate, regular rhythm and normal heart sounds.  Pulmonary/Chest: Effort normal and breath sounds normal.  Musculoskeletal: Normal range of motion. She exhibits tenderness. She exhibits no edema.       Back:  Neurological: She is alert.   Skin: Skin is warm and dry. There is erythema. There is pallor.  Psychiatric: She has a normal mood and affect.    Labs reviewed: Basic Metabolic Panel: Recent Labs    11/10/17 1225 11/17/17 1225 11/17/17 1622  NA  --  142 143  K  --  4.7 4.5  CL  --  101 102  CO2  --  30 27  GLUCOSE  --  134 148*  BUN  --  28* 26*  CREATININE  --  1.36* 1.45*  CALCIUM  --  11.1* 10.2  TSH 2.49  --   --    Liver Function Tests: Recent Labs    11/17/17 1225  AST 17  ALT 15  BILITOT 0.4  PROT 7.3   No results for input(s): LIPASE, AMYLASE in the last 8760 hours. No results for input(s): AMMONIA in the last 8760 hours. CBC: Recent Labs    11/10/17 1225 11/17/17 1622  WBC 7.7 8.0  NEUTROABS 4,158  --   HGB 13.7 13.4  HCT 39.3 40.2  MCV 86.6 91.2  PLT 274 229   Lipid Panel: Recent Labs    11/10/17 1225  CHOL 236*  HDL 77  LDLCALC 131*  TRIG 167*  CHOLHDL 3.1   TSH: Recent Labs    11/10/17 1225  TSH 2.49   A1C: No results found for: HGBA1C   Assessment/Plan 1. Chronic bilateral low back pain with bilateral sciatica -worsening pain of paraspinal muscle of lumbar spine. Will increase gabapentin to 200 mg po daily at this time - Ambulatory referral to [place] for PT and home safety  assessment -to continue tylenol BID -may use heating pad but not with pain patch on - Ambulatory referral to Podiatry for proper footwear  2. Cognitive impairment Noted, will get MMSE at AWV, continues on Aricept and discussed increase in dose but will wait til next OV  3. Hearing loss, unspecified hearing loss type, unspecified laterality - Ambulatory referral to Audiology  Next appt: 4 weeks for AWV and EV Keairra Bardon K. [place], [facility] [phone]

## 2018-01-17 ENCOUNTER — Telehealth: Payer: Self-pay

## 2018-01-17 NOTE — Telephone Encounter (Signed)
[name], physical therapist with [facility] called to inform [name], NP patient with a delay in start of care per family request.  Start of care: Tomorrow, Tuesday 01/18/18   S.Chrae B/CMA

## 2018-01-17 NOTE — Telephone Encounter (Signed)
Noted thank you

## 2018-01-25 ENCOUNTER — Encounter: Payer: Self-pay | Admitting: Podiatry

## 2018-01-25 ENCOUNTER — Ambulatory Visit (INDEPENDENT_AMBULATORY_CARE_PROVIDER_SITE_OTHER): Payer: Medicare Other | Admitting: Podiatry

## 2018-01-25 VITALS — BP 130/58 | HR 56 | Resp 16

## 2018-01-25 DIAGNOSIS — F419 Anxiety disorder, unspecified: Secondary | ICD-10-CM

## 2018-01-25 DIAGNOSIS — K5790 Diverticulosis of intestine, part unspecified, without perforation or abscess without bleeding: Secondary | ICD-10-CM

## 2018-01-25 DIAGNOSIS — I129 Hypertensive chronic kidney disease with stage 1 through stage 4 chronic kidney disease, or unspecified chronic kidney disease: Secondary | ICD-10-CM | POA: Diagnosis not present

## 2018-01-25 DIAGNOSIS — G3184 Mild cognitive impairment, so stated: Secondary | ICD-10-CM | POA: Diagnosis not present

## 2018-01-25 DIAGNOSIS — M5441 Lumbago with sciatica, right side: Secondary | ICD-10-CM | POA: Diagnosis not present

## 2018-01-25 DIAGNOSIS — N189 Chronic kidney disease, unspecified: Secondary | ICD-10-CM | POA: Diagnosis not present

## 2018-01-25 DIAGNOSIS — M5442 Lumbago with sciatica, left side: Secondary | ICD-10-CM | POA: Diagnosis not present

## 2018-01-25 DIAGNOSIS — I972 Postmastectomy lymphedema syndrome: Secondary | ICD-10-CM | POA: Diagnosis not present

## 2018-01-25 DIAGNOSIS — G629 Polyneuropathy, unspecified: Secondary | ICD-10-CM

## 2018-01-25 DIAGNOSIS — G9009 Other idiopathic peripheral autonomic neuropathy: Secondary | ICD-10-CM

## 2018-01-25 NOTE — Patient Instructions (Signed)
Peripheral Neuropathy Peripheral neuropathy is a type of nerve damage. It affects nerves that carry signals between the spinal cord and other parts of the body. These are called peripheral nerves. With peripheral neuropathy, one nerve or a group of nerves may be damaged. What are the causes? Many things can damage peripheral nerves. For some people with peripheral neuropathy, the cause is unknown. Some causes include:  Diabetes. This is the most common cause of peripheral neuropathy.  Injury to a nerve.  Pressure or stress on a nerve that lasts a long time.  Too little vitamin B. Alcoholism can lead to this.  Infections.  Autoimmune diseases, such as multiple sclerosis and systemic lupus erythematosus.  Inherited nerve diseases.  Some medicines, such as cancer drugs.  Toxic substances, such as lead and mercury.  Too little blood flowing to the legs.  Kidney disease.  Thyroid disease.  What are the signs or symptoms? Different people have different symptoms. The symptoms you have will depend on which of your nerves is damaged. Common symptoms include:  Loss of feeling (numbness) in the feet and hands.  Tingling in the feet and hands.  Pain that burns.  Very sensitive skin.  Weakness.  Not being able to move a part of the body (paralysis).  Muscle twitching.  Clumsiness or poor coordination.  Loss of balance.  Not being able to control your bladder.  Feeling dizzy.  Sexual problems.  How is this diagnosed? Peripheral neuropathy is a symptom, not a disease. Finding the cause of peripheral neuropathy can be hard. To figure that out, your health care provider will take a medical history and do a physical exam. A neurological exam will also be done. This involves checking things affected by your brain, spinal cord, and nerves (nervous system). For example, your health care provider will check your reflexes, how you move, and what you can feel. Other types of tests  may also be ordered, such as:  Blood tests.  A test of the fluid in your spinal cord.  Imaging tests, such as CT scans or an MRI.  Electromyography (EMG). This test checks the nerves that control muscles.  Nerve conduction velocity tests. These tests check how fast messages pass through your nerves.  Nerve biopsy. A small piece of nerve is removed. It is then checked under a microscope.  How is this treated?  Medicine is often used to treat peripheral neuropathy. Medicines may include: ? Pain-relieving medicines. Prescription or over-the-counter medicine may be suggested. ? Antiseizure medicine. This may be used for pain. ? Antidepressants. These also may help ease pain from neuropathy. ? Lidocaine. This is a numbing medicine. You might wear a patch or be given a shot. ? Mexiletine. This medicine is typically used to help control irregular heart rhythms.  Surgery. Surgery may be needed to relieve pressure on a nerve or to destroy a nerve that is causing pain.  Physical therapy to help movement.  Assistive devices to help movement. Follow these instructions at home:  Only take over-the-counter or prescription medicines as directed by your health care provider. Follow the instructions carefully for any given medicines. Do not take any other medicines without first getting approval from your health care provider.  If you have diabetes, work closely with your health care provider to keep your blood sugar under control.  If you have numbness in your feet: ? Check every day for signs of injury or infection. Watch for redness, warmth, and swelling. ? Wear padded socks and comfortable   shoes. These help protect your feet.  Do not do things that put pressure on your damaged nerve.  Do not smoke. Smoking keeps blood from getting to damaged nerves.  Avoid or limit alcohol. Too much alcohol can cause a lack of B vitamins. These vitamins are needed for healthy nerves.  Develop a good  support system. Coping with peripheral neuropathy can be stressful. Talk to a mental health specialist or join a support group if you are struggling.  Follow up with your health care provider as directed. Contact a health care provider if:  You have new signs or symptoms of peripheral neuropathy.  You are struggling emotionally from dealing with peripheral neuropathy.  You have a fever. Get help right away if:  You have an injury or infection that is not healing.  You feel very dizzy or begin vomiting.  You have chest pain.  You have trouble breathing. This information is not intended to replace advice given to you by your health care provider. Make sure you discuss any questions you have with your health care provider. Document Released: 02/06/2002 Document Revised: 07/25/2015 Document Reviewed: 10/24/2012 Elsevier Interactive Patient Education  2017 Elsevier Inc.  

## 2018-01-25 NOTE — Progress Notes (Signed)
Subjective:    Patient ID: [name] Morton, female    DOB: 02/20/1927, 84 y.o.   MRN: [id number]  HPI [name] Morton presents to the office today for concerns of neuropathy and needing different shoes.  She typically wears shoes with a heel however her primary care physician was concerned about her sugar changes.  She has neuropathy to her feet and she is been on gabapentin seems to be helping simply increase the dose to 200 mg at nighttime.  This is been ongoing for last couple of years.  She denies any recent injuries, but she describes burning to her feet.  She denies any balance issues.  She had a fall a month ago but this because she tripped over her back here but not because of balance.  No other concerns.  Review of Systems  All other systems reviewed and are negative.  Past Medical History:  Diagnosis Date  . Breast cancer ([place]) 1997   right - tx with lumpectomty & radiation  . Burning sensation of feet    Per New Patient Packet-PSC   . Chronic anxiety   . Chronic kidney disease    Per New Patient Packet-PSC   . Cognitive impairment    Per New Patient Packet-PSC   . Diverticulosis   . Dyslipidemia   . Fluid retention    Per New Patient Packet-PSC   . Gait difficulty   . GERD (gastroesophageal reflux disease)    RARE - NO MEDICATIONS  . Hearing impairment    BILATERAL HEARING AIDS  . Heart murmur    Per New Patient Packet-PSC   . High cholesterol    Per New Patient Packet-PSC   . History of nuclear stress test 11/2009   dipyridamole; no evidence of ischemia, normal, low risk   . History of shingles 1998   NO RESIDUAL PROBLEMS  . Hypertension    SEES CARDIOLOGIST DR. DAVID HARDING FOR HYPERTENSION   . Insomnia   . Lymphedema of arm    RIGHT   . Mitral and aortic regurgitation   . OA (osteoarthritis)    OA AND PAIN LEFT KNEE  . Osteopenia   . Scoliosis    Per New Patient Packet-PSC   . Shortness of breath    WHEN CLIMBING STEPS AT MY HOME--OTHERWISE OK  .  Sinus problem   . Sleep difficulties    ALWAYS HAD SLEEPING PROBLEMS - MEDICATION HELPS PT SLEEP ABOUT 3 HOURS  . SOB (shortness of breath)    Per New Patient Packet-PSC, hospitalized 2018     Past Surgical History:  Procedure Laterality Date  . ABDOMINAL HYSTERECTOMY  1973  . APPENDECTOMY  1951  . BREAST ADENOMA  1950   EXCISED  . BREAST LUMPECTOMY Right 1997  . COLONOSCOPY  2014   Dr.Sam Penelope Coop, Per New Patient Packet-PSC   . EYE SURGERY     BILATERAL CATARACT EXTRACTIONS AND LENS IMPLANTS  . NM MYOVIEW LTD  08/2016   Hyperdynamic LV function EF 65%. No ischemia or infarction. LOW RISK  . REPLACEMENT TOTAL KNEE  2002  . ROTATOR CUFF REPAIR Right 12/2009   Dr. Lenna Sciara. [place]   . TOTAL KNEE ARTHROPLASTY Left 05/01/2013   Procedure: LEFT TOTAL KNEE ARTHROPLASTY;  Surgeon: Gearlean Alf, MD;  Location: [facility];  Service: Orthopedics;  Laterality: Left;  . TRANSTHORACIC ECHOCARDIOGRAM  05/1994   trace mitral/tricuspic/aortic inusff; hyperkinetic LV  . TRANSTHORACIC ECHOCARDIOGRAM  08/2016   Normal LV size. EF 65-70%. GR 1  DD. (Normal for age). Mild aortic stenosis     Current Outpatient Medications:  .  acetaminophen (TYLENOL) 325 MG tablet, Take 325 mg by mouth daily as needed., Disp: , Rfl:  .  acetaminophen (TYLENOL) 650 MG CR tablet, Take 1,300 mg by mouth 2 (two) times daily. , Disp: , Rfl:  .  amLODipine-benazepril (LOTREL) 10-20 MG capsule, Take 1 capsule by mouth daily., Disp: 90 capsule, Rfl: 2 .  bisoprolol (ZEBETA) 5 MG tablet, Take 1 tablet (5 mg total) by mouth daily., Disp: 90 tablet, Rfl: 2 .  Calcium Carb-Cholecalciferol (CALCIUM 600+D3) 600-800 MG-UNIT TABS, Take 1 tablet by mouth daily. , Disp: , Rfl:  .  donepezil (ARICEPT) 5 MG tablet, Take 1 tablet (5 mg total) by mouth at bedtime. For memory loss, Disp: 90 tablet, Rfl: 2 .  furosemide (LASIX) 40 MG tablet, TAKE 40 MG BY MOUTH DAILY AND MAY TAKE AN EXTRA 20 MG ( 1/2 TABLET) IF NEEDED FOR PUFFINESS, Disp: 135  tablet, Rfl: 3 .  gabapentin (NEURONTIN) 100 MG capsule, Take 2 capsules (200 mg total) by mouth at bedtime., Disp: 90 capsule, Rfl: 3 .  Liniments (SALONPAS EX), Apply 1 application topically as needed (back pain). , Disp: , Rfl:  .  Melatonin 5 MG TABS, Take by mouth at bedtime., Disp: , Rfl:  .  Multiple Vitamins-Minerals (MULTIVITAMIN WOMEN 50+) TABS, Take by mouth daily., Disp: , Rfl:  .  simvastatin (ZOCOR) 20 MG tablet, Take 1 tablet (20 mg total) by mouth at bedtime., Disp: 90 tablet, Rfl: 2  Allergies  Allergen Reactions  . Benadryl [Diphenhydramine Hcl (Sleep)]     Makes me hyper   . Cardizem [Diltiazem Hcl]     Doesn't remember. Dr Rex Kras told patient not to take.   [name] Kitchen Ceftin [Cefuroxime Axetil] Other (See Comments)    Stomach Issues   . Epinephrine Other (See Comments)    unknown  . Oxycodone Hcl     Loss of appetite: sores on tongue and mouth  . Tetanus Toxoids Other (See Comments)    Unknown   . Tramadol Hcl     Loss of appetite: dry mouth/sores on tongue and mouth  . Vistaril [Hydroxyzine] Other (See Comments)    unknown  . Vytorin [Ezetimibe-Simvastatin]     Not comfortable taking it. Doesn't remember reaction.          Objective:   Physical Exam  General: AAO x3, NAD  Dermatological: Skin is warm, dry and supple bilateral. Nails x 10 are well manicured; remaining integument appears unremarkable at this time. There are no open sores, no preulcerative lesions, no rash or signs of infection present.  Vascular: Dorsalis Pedis artery and Posterior Tibial artery pedal pulses are 2/4 bilateral with immedate capillary fill time. Pedal hair growth present. No varicosities and no lower extremity edema present bilateral. There is no pain with calf compression, swelling, warmth, erythema.   Neruologic: Grossly intact via light touch bilateral.Protective threshold with Semmes Wienstein monofilament intact to all pedal sites bilateral.   Musculoskeletal: There is no area  pinpoint tenderness identified bilaterally.  No edema, erythema.  Muscular strength 5/5 in all groups tested bilateral.  Gait: Unassisted, Nonantalgic.     Assessment & Plan:  84 year old female with neuropathy -Treatment options discussed including all alternatives, risks, and complications -Etiology of symptoms were discussed -At this time she had a new pain to her feet.  The gabapentin seems to be helping.  Also her primary care physician as ordered physical therapy  which would be helpful.  We discussed different shoes.  She is patella.  She is that she would like to get.  I will have her follow-up with Liliane Channel to pick up shoes. She declined measurement today as she and her daughter states her size.   Trula Slade DPM

## 2018-01-26 ENCOUNTER — Telehealth: Payer: Self-pay | Admitting: *Deleted

## 2018-01-26 NOTE — Telephone Encounter (Signed)
Pete with [facility] called requesting verbal orders for Speech and OT. Cognitive evaluation. Therapy ordered at last OV.  Verbal orders given.

## 2018-02-01 DIAGNOSIS — M5441 Lumbago with sciatica, right side: Secondary | ICD-10-CM | POA: Diagnosis not present

## 2018-02-01 DIAGNOSIS — N189 Chronic kidney disease, unspecified: Secondary | ICD-10-CM | POA: Diagnosis not present

## 2018-02-01 DIAGNOSIS — I129 Hypertensive chronic kidney disease with stage 1 through stage 4 chronic kidney disease, or unspecified chronic kidney disease: Secondary | ICD-10-CM | POA: Diagnosis not present

## 2018-02-01 DIAGNOSIS — M5442 Lumbago with sciatica, left side: Secondary | ICD-10-CM | POA: Diagnosis not present

## 2018-02-01 DIAGNOSIS — G3184 Mild cognitive impairment, so stated: Secondary | ICD-10-CM | POA: Diagnosis not present

## 2018-02-01 DIAGNOSIS — I972 Postmastectomy lymphedema syndrome: Secondary | ICD-10-CM | POA: Diagnosis not present

## 2018-02-03 DIAGNOSIS — M5442 Lumbago with sciatica, left side: Secondary | ICD-10-CM | POA: Diagnosis not present

## 2018-02-03 DIAGNOSIS — G3184 Mild cognitive impairment, so stated: Secondary | ICD-10-CM | POA: Diagnosis not present

## 2018-02-03 DIAGNOSIS — I129 Hypertensive chronic kidney disease with stage 1 through stage 4 chronic kidney disease, or unspecified chronic kidney disease: Secondary | ICD-10-CM | POA: Diagnosis not present

## 2018-02-03 DIAGNOSIS — I972 Postmastectomy lymphedema syndrome: Secondary | ICD-10-CM | POA: Diagnosis not present

## 2018-02-03 DIAGNOSIS — M5441 Lumbago with sciatica, right side: Secondary | ICD-10-CM | POA: Diagnosis not present

## 2018-02-03 DIAGNOSIS — N189 Chronic kidney disease, unspecified: Secondary | ICD-10-CM | POA: Diagnosis not present

## 2018-02-07 DIAGNOSIS — N189 Chronic kidney disease, unspecified: Secondary | ICD-10-CM | POA: Diagnosis not present

## 2018-02-07 DIAGNOSIS — M5442 Lumbago with sciatica, left side: Secondary | ICD-10-CM | POA: Diagnosis not present

## 2018-02-07 DIAGNOSIS — I129 Hypertensive chronic kidney disease with stage 1 through stage 4 chronic kidney disease, or unspecified chronic kidney disease: Secondary | ICD-10-CM | POA: Diagnosis not present

## 2018-02-07 DIAGNOSIS — I972 Postmastectomy lymphedema syndrome: Secondary | ICD-10-CM | POA: Diagnosis not present

## 2018-02-07 DIAGNOSIS — M5441 Lumbago with sciatica, right side: Secondary | ICD-10-CM | POA: Diagnosis not present

## 2018-02-07 DIAGNOSIS — G3184 Mild cognitive impairment, so stated: Secondary | ICD-10-CM | POA: Diagnosis not present

## 2018-02-17 DIAGNOSIS — N189 Chronic kidney disease, unspecified: Secondary | ICD-10-CM | POA: Diagnosis not present

## 2018-02-17 DIAGNOSIS — I972 Postmastectomy lymphedema syndrome: Secondary | ICD-10-CM | POA: Diagnosis not present

## 2018-02-17 DIAGNOSIS — G3184 Mild cognitive impairment, so stated: Secondary | ICD-10-CM | POA: Diagnosis not present

## 2018-02-17 DIAGNOSIS — M5441 Lumbago with sciatica, right side: Secondary | ICD-10-CM | POA: Diagnosis not present

## 2018-02-17 DIAGNOSIS — M5442 Lumbago with sciatica, left side: Secondary | ICD-10-CM | POA: Diagnosis not present

## 2018-02-17 DIAGNOSIS — I129 Hypertensive chronic kidney disease with stage 1 through stage 4 chronic kidney disease, or unspecified chronic kidney disease: Secondary | ICD-10-CM | POA: Diagnosis not present

## 2018-02-18 DIAGNOSIS — M5441 Lumbago with sciatica, right side: Secondary | ICD-10-CM | POA: Diagnosis not present

## 2018-02-18 DIAGNOSIS — I129 Hypertensive chronic kidney disease with stage 1 through stage 4 chronic kidney disease, or unspecified chronic kidney disease: Secondary | ICD-10-CM | POA: Diagnosis not present

## 2018-02-18 DIAGNOSIS — G3184 Mild cognitive impairment, so stated: Secondary | ICD-10-CM | POA: Diagnosis not present

## 2018-02-18 DIAGNOSIS — N189 Chronic kidney disease, unspecified: Secondary | ICD-10-CM | POA: Diagnosis not present

## 2018-02-18 DIAGNOSIS — M5442 Lumbago with sciatica, left side: Secondary | ICD-10-CM | POA: Diagnosis not present

## 2018-02-18 DIAGNOSIS — I972 Postmastectomy lymphedema syndrome: Secondary | ICD-10-CM | POA: Diagnosis not present

## 2018-03-01 ENCOUNTER — Other Ambulatory Visit: Payer: Self-pay

## 2018-03-01 ENCOUNTER — Encounter: Payer: Self-pay | Admitting: Adult Health

## 2018-03-01 ENCOUNTER — Ambulatory Visit (INDEPENDENT_AMBULATORY_CARE_PROVIDER_SITE_OTHER): Payer: Medicare Other | Admitting: Adult Health

## 2018-03-01 VITALS — BP 114/80 | HR 69 | Temp 97.7°F | Ht 60.0 in | Wt 140.8 lb

## 2018-03-01 DIAGNOSIS — G629 Polyneuropathy, unspecified: Secondary | ICD-10-CM | POA: Diagnosis not present

## 2018-03-01 DIAGNOSIS — M79601 Pain in right arm: Secondary | ICD-10-CM

## 2018-03-01 MED ORDER — GABAPENTIN 100 MG PO CAPS: 200.0000 mg | ORAL_CAPSULE | Freq: Every day | ORAL | 1 refills | Status: DC

## 2018-03-01 NOTE — Telephone Encounter (Signed)
Patient's daughter called and stated that patient was doing good on Gabapentin and wanted to request having medication refilled. She stated she wanted a 3 month supply called in to [facility].

## 2018-03-01 NOTE — Telephone Encounter (Signed)
Okay to provide refill, make sure she keeps her upcoming follow up

## 2018-03-01 NOTE — Patient Instructions (Addendum)
Massage Biofreeze 4% gel to right arm three times a day as needed for pain.  Take Ibuprofen 200 mg take 2 tabs = 400 mg every 6 hours as needed for pain. Take with food.  Continue Taking Neurontin at bedtime.

## 2018-03-01 NOTE — Telephone Encounter (Signed)
RX sent, patient has pending appointment

## 2018-03-01 NOTE — Progress Notes (Signed)
Location:  [facility] [address], [place]  [address]   Place of Service:  [facility]  Provider:  Durenda Age, NP  Patient Care Team: [name] Chandler, NP as PCP - General (Geriatric Medicine) [name] Horner, MD as Consulting Physician (Gastroenterology) [name] Arabian, MD as Consulting Physician (Orthopedic Surgery) [name] Man, MD as Consulting Physician (Cardiology) [name] Hough, MD as Consulting Physician (Ophthalmology)  Extended Emergency Contact Information Primary Emergency Contact: [name] Morton of [place] Phone: [phone] Mobile Phone: [phone] Relation: Daughter Secondary Emergency Contact: [name] Morton,[name] Morton          [name], [place] Phone: [phone] Relation: Daughter  Code Status:  Full Code  Goals of care: Advanced Directive information Advanced Directives 03/01/2018  Does Patient Have a Medical Advance Directive? Yes  Type of Advance Directive [place]  Does patient want to make changes to medical advance directive? Yes (Inpatient - patient defers changing a medical advance directive at this time)  Copy of [place] in Chart? Yes - validated most recent copy scanned in chart (See row information)  Would patient like information on creating a medical advance directive? -  Pre-existing out of facility DNR order (yellow form or pink MOST form) -     Chief Complaint  Patient presents with  . Acute Visit    Right arm pain, shoulder to fingers. Niece, [name] Morton, accompanies.    HPI:  Pt is an 84 y.o. female seen today for an acute visit for right arm pain from her shoulders to her fingers.  She states she has had pain on and off after having  lymph node  surgery after the mastectomy. She denies having trauma to right arm. She has PMH of status post breast cancer, chronic anxiety, dementia, HLD, GERD, HTN, and a heart murmur.     Past Medical History:    Diagnosis Date  . Breast cancer ([place]) 1997   right - tx with lumpectomty & radiation  . Burning sensation of feet    Per [facility] Patient [facility]   . Chronic anxiety   . Chronic kidney disease    Per [facility]   . Cognitive impairment    Per [facility]   . Diverticulosis   . Dyslipidemia   . Fluid retention    Per [facility]   . Gait difficulty   . GERD (gastroesophageal reflux disease)    RARE - NO MEDICATIONS  . Hearing impairment    BILATERAL HEARING AIDS  . Heart murmur    Per [facility]   . High cholesterol    Per [facility]   . History of nuclear stress test 11/2009   dipyridamole; no evidence of ischemia, normal, low risk   . History of shingles 1998   NO RESIDUAL PROBLEMS  . Hypertension    SEES CARDIOLOGIST [name]. DAVID Morton FOR HYPERTENSION   . Insomnia   . Lymphedema of arm    RIGHT   . Mitral and aortic regurgitation   . OA (osteoarthritis)    OA AND PAIN LEFT KNEE  . Osteopenia   . Scoliosis    Per [facility] Patient Packet[facility]   . Shortness of breath    WHEN CLIMBING STEPS AT MY HOME--OTHERWISE OK  . Sinus problem   . Sleep difficulties    ALWAYS HAD SLEEPING PROBLEMS - MEDICATION HELPS PT SLEEP ABOUT 3 HOURS  . SOB (shortness of breath)  Per New Patient Packet[facility], hospitalized 2018    Past Surgical History:  Procedure Laterality Date  . ABDOMINAL HYSTERECTOMY  1973  . APPENDECTOMY  1951  . BREAST ADENOMA  1950   EXCISED  . BREAST LUMPECTOMY Right 1997  . COLONOSCOPY  2014   [name].Sam Penelope Morton, Per New Patient Packet[facility]   . EYE SURGERY     BILATERAL CATARACT EXTRACTIONS AND LENS IMPLANTS  . NM MYOVIEW LTD  08/2016   Hyperdynamic LV function EF 65%. No ischemia or infarction. LOW RISK  . REPLACEMENT TOTAL KNEE  2002  . ROTATOR CUFF REPAIR Right 12/2009   [name]. Lenna Morton. [name] Morton   . TOTAL KNEE ARTHROPLASTY Left 05/01/2013   Procedure: LEFT TOTAL KNEE ARTHROPLASTY;  Surgeon: [name] Alf, MD;   Location: [facility];  Service: Orthopedics;  Laterality: Left;  . TRANSTHORACIC ECHOCARDIOGRAM  05/1994   trace mitral/tricuspic/aortic inusff; hyperkinetic LV  . TRANSTHORACIC ECHOCARDIOGRAM  08/2016   Normal LV size. EF 65-70%. GR 1 DD. (Normal for Morton). Mild aortic stenosis    Allergies  Allergen Reactions  . Benadryl [Diphenhydramine Hcl (Sleep)]     Makes me hyper   . Cardizem [Diltiazem Hcl]     Doesn't remember. [name] Morton told patient not to take.   [name] Kitchen Ceftin [Cefuroxime Axetil] Other (See Comments)    Stomach Issues   . Epinephrine Other (See Comments)    unknown  . Oxycodone Hcl     Loss of appetite: sores on tongue and mouth  . Tetanus Toxoids Other (See Comments)    Unknown   . Tramadol Hcl     Loss of appetite: dry mouth/sores on tongue and mouth  . Vistaril [Hydroxyzine] Other (See Comments)    unknown  . Vytorin [Ezetimibe-Simvastatin]     Not comfortable taking it. Doesn't remember reaction.     Outpatient Encounter Medications as of 03/01/2018  Medication Sig  . acetaminophen (TYLENOL) 650 MG CR tablet Take 1,300 mg by mouth 2 (two) times daily.   [name] Kitchen amLODipine-benazepril (LOTREL) 10-20 MG capsule Take 1 capsule by mouth daily.  . bisoprolol (ZEBETA) 5 MG tablet Take 1 tablet (5 mg total) by mouth daily.  . Calcium Carb-Cholecalciferol (CALCIUM 600+D3) 600-800 MG-UNIT TABS Take 1 tablet by mouth daily.   [name] Kitchen donepezil (ARICEPT) 5 MG tablet Take 1 tablet (5 mg total) by mouth at bedtime. For memory loss  . furosemide (LASIX) 40 MG tablet TAKE 40 MG BY MOUTH DAILY AND MAY TAKE AN EXTRA 20 MG ( 1/2 TABLET) IF NEEDED FOR PUFFINESS  . gabapentin (NEURONTIN) 100 MG capsule Take 2 capsules (200 mg total) by mouth at bedtime.  . Liniments (SALONPAS EX) Apply 1 application topically as needed (back pain).   . Melatonin 5 MG TABS Take by mouth at bedtime.  . Multiple Vitamins-Minerals (MULTIVITAMIN WOMEN 50+) TABS Take by mouth daily.  . simvastatin (ZOCOR) 20 MG tablet Take 1  tablet (20 mg total) by mouth at bedtime.  . [DISCONTINUED] acetaminophen (TYLENOL) 325 MG tablet Take 650 mg by mouth 2 (two) times daily. Scheduled   No facility-administered encounter medications on file as of 03/01/2018.     Review of Systems  GENERAL: No change in appetite, no fatigue, no weight changes, no fever, chills or weakness SKIN: Denies rash, itching, wounds, ulcer sores, or nail abnormalities MOUTH and THROAT: Denies oral discomfort, gingival pain  RESPIRATORY: no cough, SOB, DOE, wheezing, hemoptysis CARDIAC: No chest pain, or palpitations GI: No abdominal pain, diarrhea, constipation, heart burn, nausea or  vomiting GU: Denies dysuria, frequency, hematuria, or discharge MUSCULOSKELETAL: + right arm pain  PSYCHIATRIC: +insomnia    Immunization History  Administered Date(s) Administered  . Influenza, High Dose Seasonal PF 12/23/2015, 11/29/2017  . Influenza-Unspecified 11/30/2016  . Pneumococcal Conjugate-13 07/06/2013  . Pneumococcal Polysaccharide-23 05/05/2004  . Tetanus 01/27/2002  . Zoster 09/16/2007   Pertinent  Health Maintenance Due  Topic Date Due  . DEXA SCAN  08/07/1993  . INFLUENZA VACCINE  Completed  . PNA vac Low Risk Adult  Completed   Fall Risk  01/11/2018 11/17/2017 08/17/2017  Falls in the past year? 0 Yes No  Number falls in past yr: - 1 -  Injury with Fall? - Yes -  Comment - Injured left side  -    Vitals:   03/01/18 1502  BP: 114/80  Pulse: 69  Temp: 97.7 F (36.5 C)  TempSrc: Oral  SpO2: 97%  Weight: 140 lb 12.8 oz (63.9 kg)  Height: 5' (1.524 m)   Body mass index is 27.5 kg/m.  Physical Exam  GENERAL APPEARANCE: Well nourished. Normal body habitus SKIN:  Skin is warm and dry.  MOUTH and THROAT: Lips are without lesions. Oral mucosa is moist and without lesions.  RESPIRATORY: Breathing is even & unlabored, BS CTAB CARDIAC: RRR, no murmur,no extra heart sounds, trace edema RUE GI: Abdomen soft, normal BS, no masses, no  tenderness EXTREMITIES:  Able to move X 4 extremities, right arm has limited ROM due to pain NEUROLOGICAL: There is no tremor. Speech is clear. Alert and oriented X 3. PSYCHIATRIC:  Affect and behavior are appropriate   Labs reviewed: Recent Labs    11/17/17 1225 11/17/17 1622  NA 142 143  K 4.7 4.5  CL 101 102  CO2 30 27  GLUCOSE 134 148*  BUN 28* 26*  CREATININE 1.36* 1.45*  CALCIUM 11.1* 10.2   Recent Labs    11/17/17 1225  AST 17  ALT 15  BILITOT 0.4  PROT 7.3   Recent Labs    11/10/17 1225 11/17/17 1622  WBC 7.7 8.0  NEUTROABS 4,158  --   HGB 13.7 13.4  HCT 39.3 40.2  MCV 86.6 91.2  PLT 274 229   Lab Results  Component Value Date   TSH 2.49 11/10/2017    Lab Results  Component Value Date   CHOL 236 (H) 11/10/2017   HDL 77 11/10/2017   LDLCALC 131 (H) 11/10/2017   TRIG 167 (H) 11/10/2017   CHOLHDL 3.1 11/10/2017     Assessment/Plan  1. Right arm pain - Massage Biofreeze 4% gel to right arm three times a day as needed for pain. Take Ibuprofen 200 mg take 2 tabs = 400 mg every 6 hours as needed for pain. Take with food.   2. Neuropathy - Continue Taking Neurontin at bedtime, encourage elevation of right arm     [name] Age, NP [place] [phone] (Monday-Friday 8:00 a.m. - 5:00 p.m.) [phone] (after hours)

## 2018-03-04 ENCOUNTER — Telehealth: Payer: Self-pay | Admitting: *Deleted

## 2018-03-04 NOTE — Telephone Encounter (Signed)
[name], daughter called and left message on Clinical intake stating that patient was SOB and that her sister was going to check on her.   I called [name], daughter back and she stated that patient was having a "pitty Party" for herself and was fine. Daughter asked about her appointment she had on 03/01/18. Stated that the patient forgot what she was told. Confirmed instructions with daughter.

## 2018-03-31 ENCOUNTER — Ambulatory Visit: Payer: Self-pay | Admitting: Neurology

## 2018-04-05 ENCOUNTER — Encounter: Payer: Self-pay | Admitting: Neurology

## 2018-04-05 ENCOUNTER — Other Ambulatory Visit: Payer: Self-pay

## 2018-04-05 ENCOUNTER — Ambulatory Visit (INDEPENDENT_AMBULATORY_CARE_PROVIDER_SITE_OTHER): Payer: Medicare Other | Admitting: Neurology

## 2018-04-05 VITALS — BP 136/70 | HR 70 | Ht 60.0 in | Wt 138.0 lb

## 2018-04-05 DIAGNOSIS — F03A0 Unspecified dementia, mild, without behavioral disturbance, psychotic disturbance, mood disturbance, and anxiety: Secondary | ICD-10-CM

## 2018-04-05 DIAGNOSIS — G629 Polyneuropathy, unspecified: Secondary | ICD-10-CM

## 2018-04-05 DIAGNOSIS — R4189 Other symptoms and signs involving cognitive functions and awareness: Secondary | ICD-10-CM | POA: Diagnosis not present

## 2018-04-05 DIAGNOSIS — F0390 Unspecified dementia without behavioral disturbance: Secondary | ICD-10-CM

## 2018-04-05 MED ORDER — MEMANTINE HCL 10 MG PO TABS: 10.0000 mg | ORAL_TABLET | Freq: Two times a day (BID) | ORAL | 11 refills | Status: DC

## 2018-04-05 MED ORDER — DONEPEZIL HCL 5 MG PO TABS: 5.0000 mg | ORAL_TABLET | Freq: Every day | ORAL | 3 refills | Status: DC

## 2018-04-05 MED ORDER — GABAPENTIN 100 MG PO CAPS: ORAL_CAPSULE | ORAL | 3 refills | Status: DC

## 2018-04-05 NOTE — Patient Instructions (Signed)
1. Start Namenda (Memantine) 10mg : Take 1 tablet twice a day  2. Continue Donepezil 5mg  daily  3. Increase gabapentin 100mg : take 3 capsules every night  4. Continue having your daughter help with medications. Recommend having more help at home  5. Follow-up in 6 months, call for any changes  FALL PRECAUTIONS: Be cautious when walking. Scan the area for obstacles that may increase the risk of trips and falls. When getting up in the mornings, sit up at the edge of the bed for a few minutes before getting out of bed. Consider elevating the bed at the head end to avoid drop of blood pressure when getting up. Walk always in a well-lit room (use night lights in the walls). Avoid area rugs or power cords from appliances in the middle of the walkways. Use a walker or a cane if necessary and consider physical therapy for balance exercise. Get your eyesight checked regularly.  FINANCIAL OVERSIGHT: Supervision, especially oversight when making financial decisions or transactions is also recommended.  HOME SAFETY: Consider the safety of the kitchen when operating appliances like stoves, microwave oven, and blender. Consider having supervision and share cooking responsibilities until no longer able to participate in those. Accidents with firearms and other hazards in the house should be identified and addressed as well.  ABILITY TO BE LEFT ALONE: If patient is unable to contact 911 operator, consider using LifeLine, or when the need is there, arrange for someone to stay with patients. Smoking is a fire hazard, consider supervision or cessation. Risk of wandering should be assessed by caregiver and if detected at any point, supervision and safe proof recommendations should be instituted.  MEDICATION SUPERVISION: Inability to self-administer medication needs to be constantly addressed. Implement a mechanism to ensure safe administration of the medications.  RECOMMENDATIONS FOR ALL PATIENTS WITH MEMORY  PROBLEMS: 1. Continue to exercise (Recommend 30 minutes of walking everyday, or 3 hours every week) 2. Increase social interactions - continue going to [place] and enjoy social gatherings with friends and family 3. Eat healthy, avoid fried foods and eat more fruits and vegetables 4. Maintain adequate blood pressure, blood sugar, and blood cholesterol level. Reducing the risk of stroke and cardiovascular disease also helps promoting better memory. 5. Avoid stressful situations. Live a simple life and avoid aggravations. Organize your time and prepare for the next day in anticipation. 6. Sleep well, avoid any interruptions of sleep and avoid any distractions in the bedroom that may interfere with adequate sleep quality 7. Avoid sugar, avoid sweets as there is a strong link between excessive sugar intake, diabetes, and cognitive impairment The Mediterranean diet has been shown to help patients reduce the risk of progressive memory disorders and reduces cardiovascular risk. This includes eating fish, eat fruits and green leafy vegetables, nuts like almonds and hazelnuts, walnuts, and also use olive oil. Avoid fast foods and fried foods as much as possible. Avoid sweets and sugar as sugar use has been linked to worsening of memory function.  There is always a concern of gradual progression of memory problems. If this is the case, then we may need to adjust level of care according to patient needs. Support, both to the patient and caregiver, should then be put into place.

## 2018-04-05 NOTE — Progress Notes (Signed)
NEUROLOGY FOLLOW UP OFFICE NOTE  [name] Morton [id number] [birth year]  HISTORY OF PRESENT ILLNESS: I had the pleasure of seeing [name] Morton in follow-up in the neurology clinic on 04/05/2018.  The patient was last seen 7 months ago for mild dementia and is again accompanied by her daughter [name] Morton who helps supplement the history today.  Records and images were personally reviewed where available.  MOCA score 21/30 in June 2019. She is on Donepezil 5mg  daily. They were reporting diarrhea on her last visit but states she is not having it as much. Her other daughter fixes her pillbox, Lasya would feel better if [name] Morton takes this over again. She does not cook. She does not drive. [name] manages finances. She is independent with dressing and bathing. She was also reporting neuropathic pain on her last visit and was started on gabapentin 200mg  qhs. She reports this has helped, there is still some burning at night before she takes her medication. She has found applying Biofreeze also helps. No side effects, she is still not sleeping at night (chronic). She reports her nose is constantly running. She denies any headaches, dizziness, vision changes, no falls.   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 She reports her memory is bad, she has a lot of problems remembering from things back. She lives alone. Her daughter started noticing memory changes around 2 years ago, more noticeable this past year. She would repeat herself, asking the same questions. Her daughter lives 2 hours away and expressed concern about her medications. She apparently went 3 weeks without 3 medications because her mail delivery did not arrive. Her daughter visits her every 1-2 weeks and noticed she misses her medications when visiting. She denies getting lost driving but kept asking  her daughter about the GPS feature on the phone. Her daughter has hired Company secretary to come at home 10 hours a week, they drive her to appointments. Her daughter took over bills this year, she was not comfortable with writing checks, and a lot of times would have a question about a charge. She misplaces things frequently, around 2-3 years ago she would blame her daughter when she would not find them. Her mother had dementia. No history of significant head injuries or alcohol intake. Her daughter has noticed more irritability, stating "mother's always right," and when she thinks she is right and this is contested, she does not take it well. No recent paranoia, no hallucinations. She is able to bathe and dress independently.  She has low back pain, right thumb pain after right arm surgery. She has near-constant burning pain in the bottom of her feet. She has had occasional loose stools with leakage the past 6 months. Otherwise she denies any headaches, dizziness, vision changes, dysarthria/dysphagia, neck/back pain, bladder dysfunction, anosmia, or tremors. She states she never sleeps, she usually watches TV or reads. She denies daytime drowsiness but her daughter disagrees.   She had an MRI brain with and without contrast done 05/25/17 which did not show any acute changes. There were numerous nonspecific foci of T2 FLAIR hyperintense signal changes in the subcortical and periventricular white matter, moderate diffuse volume loss, no abnormal enhancement.   PAST MEDICAL HISTORY: Past Medical History:  Diagnosis Date  . Breast cancer ([place]) 1997   right - tx with lumpectomty & radiation  . Burning sensation of feet  Per New Patient Packet-[facility]   . Chronic anxiety   . Chronic kidney disease    Per New Patient Packet-[facility]   . Cognitive impairment    Per New Patient Packet-[facility]   . Diverticulosis   . Dyslipidemia   . Fluid retention    Per New Patient Packet-[facility]   . Gait difficulty   . GERD  (gastroesophageal reflux disease)    RARE - NO MEDICATIONS  . Hearing impairment    BILATERAL HEARING AIDS  . Heart murmur    Per New Patient Packet-[facility]   . High cholesterol    Per New Patient Packet-[facility]   . History of nuclear stress test 11/2009   dipyridamole; no evidence of ischemia, normal, low risk   . History of shingles 1998   NO RESIDUAL PROBLEMS  . Hypertension    SEES CARDIOLOGIST DR. DAVID HARDING FOR HYPERTENSION   . Insomnia   . Lymphedema of arm    RIGHT   . Mitral and aortic regurgitation   . OA (osteoarthritis)    OA AND PAIN LEFT KNEE  . Osteopenia   . Scoliosis    Per New Patient Packet-[facility]   . Shortness of breath    WHEN CLIMBING STEPS AT MY HOME--OTHERWISE OK  . Sinus problem   . Sleep difficulties    ALWAYS HAD SLEEPING PROBLEMS - MEDICATION HELPS PT SLEEP ABOUT 3 HOURS  . SOB (shortness of breath)    Per New Patient Packet-[facility], hospitalized 2018     MEDICATIONS: Current Outpatient Medications on File Prior to Visit  Medication Sig Dispense Refill  . acetaminophen (TYLENOL) 650 MG CR tablet Take 1,300 mg by mouth 2 (two) times daily.     [name] Kitchen amLODipine-benazepril (LOTREL) 10-20 MG capsule Take 1 capsule by mouth daily. 90 capsule 2  . bisoprolol (ZEBETA) 5 MG tablet Take 1 tablet (5 mg total) by mouth daily. 90 tablet 2  . Calcium Carb-Cholecalciferol (CALCIUM 600+D3) 600-800 MG-UNIT TABS Take 1 tablet by mouth daily.     [name] Kitchen donepezil (ARICEPT) 5 MG tablet Take 1 tablet (5 mg total) by mouth at bedtime. For memory loss 90 tablet 2  . furosemide (LASIX) 40 MG tablet TAKE 40 MG BY MOUTH DAILY AND MAY TAKE AN EXTRA 20 MG ( 1/2 TABLET) IF NEEDED FOR PUFFINESS 135 tablet 3  . gabapentin (NEURONTIN) 100 MG capsule Take 2 capsules (200 mg total) by mouth at bedtime. 180 capsule 1  . ibuprofen (ADVIL,MOTRIN) 200 MG tablet Take 400 mg by mouth every 6 (six) hours as needed. Take 2 tablets to = 400 mg every 6 hours prn    . Liniments (SALONPAS EX) Apply 1  application topically as needed (back pain).     . Melatonin 5 MG TABS Take by mouth at bedtime.    . Menthol, Topical Analgesic, (BIOFREEZE COLORLESS) 4 % GEL Apply 1 application topically 3 (three) times daily. Massage into right arm three times a day for pain.    . Multiple Vitamins-Minerals (MULTIVITAMIN WOMEN 50+) TABS Take by mouth daily.    . simvastatin (ZOCOR) 20 MG tablet Take 1 tablet (20 mg total) by mouth at bedtime. 90 tablet 2   No current facility-administered medications on file prior to visit.     ALLERGIES: Allergies  Allergen Reactions  . Benadryl [Diphenhydramine Hcl (Sleep)]     Makes me hyper   . Cardizem [Diltiazem Hcl]     Doesn't remember. Dr Rex Kras told patient not to take.   [name] Kitchen Ceftin [Cefuroxime  Axetil] Other (See Comments)    Stomach Issues   . Epinephrine Other (See Comments)    unknown  . Oxycodone Hcl     Loss of appetite: sores on tongue and mouth  . Tetanus Toxoids Other (See Comments)    Unknown   . Tramadol Hcl     Loss of appetite: dry mouth/sores on tongue and mouth  . Vistaril [Hydroxyzine] Other (See Comments)    unknown  . Vytorin [Ezetimibe-Simvastatin]     Not comfortable taking it. Doesn't remember reaction.     FAMILY HISTORY: Family History  Problem Relation Age of Onset  . Heart attack Mother   . Arthritis Mother   . Dementia Mother   . Heart Problems Father   . Stroke Father   . Heart Problems Maternal Grandmother   . Heart Problems Maternal Grandfather   . Asthma Maternal Grandfather   . Heart attack Brother 22  . Heart disease Brother        CABG  . Lung cancer Sister   . Emphysema Sister   . Cancer Sister   . Narcolepsy Child   . Arthritis Child   . High blood pressure Child   . Alcoholism Brother   . Post-traumatic stress disorder Brother   . COPD Sister   . Seizures Sister   . Heart Problems Sister     SOCIAL HISTORY: Social History   Socioeconomic History  . Marital status: Widowed    Spouse name:  Not on file  . Number of children: 2  . Years of education: Not on file  . Highest education level: Not on file  Occupational History  . Occupation: retired Animal nutritionist  . Financial resource strain: Not on file  . Food insecurity:    Worry: Not on file    Inability: Not on file  . Transportation needs:    Medical: Not on file    Non-medical: Not on file  Tobacco Use  . Smoking status: Former Smoker    Years: 5.00  . Smokeless tobacco: Never Used  Substance and Sexual Activity  . Alcohol use: No  . Drug use: No  . Sexual activity: Not on file  Lifestyle  . Physical activity:    Days per week: Not on file    Minutes per session: Not on file  . Stress: Not on file  Relationships  . Social connections:    Talks on phone: Not on file    Gets together: Not on file    Attends religious service: Not on file    Active member of club or organization: Not on file    Attends meetings of clubs or organizations: Not on file    Relationship status: Not on file  . Intimate partner violence:    Fear of current or ex partner: Not on file    Emotionally abused: Not on file    Physically abused: Not on file    Forced sexual activity: Not on file  Other Topics Concern  . Not on file  Social History Narrative   Widowed since 11/2010.  Mother of 2.  No real exercise due to knee.   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 No   Do you have difficulty preparing food or eating? Yes   Do you have  difficulty managing your medications? Yes   Do you have difficulty managing your finances? Yes   Do you have difficulty affording your medications? Yes    REVIEW OF SYSTEMS: Constitutional: No fevers, chills, or sweats, no generalized fatigue, change in appetite Eyes: No visual changes, double vision, eye pain Ear, nose and throat: No hearing loss, ear pain, nasal congestion, sore throat Cardiovascular: No chest pain, palpitations Respiratory:  No shortness of breath at rest or with exertion, wheezes GastrointestinaI: No nausea, vomiting, diarrhea, abdominal pain, fecal incontinence Genitourinary:  No dysuria, urinary retention or frequency Musculoskeletal:  No neck pain, back pain Integumentary: No rash, pruritus, skin lesions Neurological: as above Psychiatric: No depression, anxiety, + insomnia, Endocrine: No palpitations, fatigue, diaphoresis, mood swings, change in appetite, change in weight, increased thirst Hematologic/Lymphatic:  No anemia, purpura, petechiae. Allergic/Immunologic: no itchy/runny eyes, nasal congestion, recent allergic reactions, rashes  PHYSICAL EXAM: Vitals:   04/05/18 1503  BP: 136/70  Pulse: 70  SpO2: 95%   General: No acute distress Head:  Normocephalic/atraumatic Neck: supple, no paraspinal tenderness, full range of motion Heart:  Regular rate and rhythm Lungs:  Clear to auscultation bilaterally Back: No paraspinal tenderness Skin/Extremities: No rash, no edema Neurological Exam: alert and oriented to person, place, and time. No aphasia or dysarthria. Fund of knowledge is appropriate.  Recent and remote memory are impaired.  Attention and concentration are reduced.    Able to name objects, difficulty with repetition.  Montreal Cognitive Assessment  04/05/2018 08/17/2017  Visuospatial/ Executive (0/5) 2 5  Naming (0/3) 3 2  Attention: Read list of digits (0/2) 1 2  Attention: Read list of letters (0/1) 1 1  Attention: Serial 7 subtraction starting  at 100 (0/3) 0 3  Language: Repeat phrase (0/2) 0 2  Language : Fluency (0/1) 0 0  Abstraction (0/2) 0 2  Delayed Recall (0/5) 0 0  Orientation (0/6) 5 4  Total 12 21   Cranial nerves: Pupils equal, round, reactive to light.  Extraocular movements intact with no nystagmus. Visual fields full. Facial sensation intact. No facial asymmetry. Tongue, uvula, palate midline.  Motor: Bulk and tone normal, muscle strength 5/5 throughout with no pronator drift.  Sensation to light touch intact.  No extinction to double simultaneous stimulation.  Finger to nose testing intact.  Gait narrow-based and steady, unable to tandem walk.  IMPRESSION: This is an 85 yo RH woman with a history of  history of hypertension, hyperlipidemia, breast cancer, with mild to moderate dementia, likely Alzheimer's disease. MOCA score today 12/30 (21/30 in June 2019). MRI brain no acute changes. We discussed continued memory decline, she is having diarrhea at times, hesitant to increase Donepezil from 5mg  daily. We discussed adding on Memantine 10mg  BID, side effects discussed. We discussed increasing need for medication supervision and getting more help at home. She is not interested in assisted living. Increase gabapentin to 300mg  qhs for neuropathy. We again  discussed the importance of control of vascular risk factors, physical exercise, and brain stimulation exercises for brain health. She will follow-up in 6 months and knows to call for any changes.   Thank you for allowing me to participate in her care.  Please do not hesitate to call for any questions or concerns.  The duration of this appointment visit was 30 minutes of face-to-face time with the patient.  Greater than 50% of this time was spent in counseling, explanation of diagnosis, planning of further management, and coordination of care.   Ellouise Newer, M.D.   CC: Sherrie Mustache, NP

## 2018-04-13 ENCOUNTER — Telehealth: Payer: Self-pay | Admitting: Neurology

## 2018-04-13 NOTE — Telephone Encounter (Signed)
Daughter called about the Namenda medication is working well for the patient and to [facility] day [facility]. Thanks!

## 2018-04-18 MED ORDER — MEMANTINE HCL 10 MG PO TABS: 10.0000 mg | ORAL_TABLET | Freq: Two times a day (BID) | ORAL | 3 refills | Status: DC

## 2018-04-18 NOTE — Telephone Encounter (Signed)
Namenda 10mg  #180 with 3 refills Sig = take 1 tab BID  [facility] to [facility]

## 2018-04-18 NOTE — Telephone Encounter (Signed)
Patient daughter states that she is doing great on the Namenda full dose now and would like [place] to call in a new RX for 3 months for the full dose

## 2018-04-25 ENCOUNTER — Ambulatory Visit: Payer: Medicare Other

## 2018-04-25 ENCOUNTER — Ambulatory Visit (INDEPENDENT_AMBULATORY_CARE_PROVIDER_SITE_OTHER): Payer: Medicare Other | Admitting: Nurse Practitioner

## 2018-04-25 ENCOUNTER — Encounter: Payer: Self-pay | Admitting: Nurse Practitioner

## 2018-04-25 VITALS — BP 134/62 | HR 65 | Temp 97.9°F | Ht 60.0 in | Wt 140.8 lb

## 2018-04-25 DIAGNOSIS — I890 Lymphedema, not elsewhere classified: Secondary | ICD-10-CM

## 2018-04-25 DIAGNOSIS — Z0000 Encounter for general adult medical examination without abnormal findings: Secondary | ICD-10-CM

## 2018-04-25 DIAGNOSIS — G629 Polyneuropathy, unspecified: Secondary | ICD-10-CM

## 2018-04-25 DIAGNOSIS — I5032 Chronic diastolic (congestive) heart failure: Secondary | ICD-10-CM

## 2018-04-25 DIAGNOSIS — I10 Essential (primary) hypertension: Secondary | ICD-10-CM | POA: Diagnosis not present

## 2018-04-25 DIAGNOSIS — H1013 Acute atopic conjunctivitis, bilateral: Secondary | ICD-10-CM

## 2018-04-25 DIAGNOSIS — Z1231 Encounter for screening mammogram for malignant neoplasm of breast: Secondary | ICD-10-CM | POA: Diagnosis not present

## 2018-04-25 DIAGNOSIS — E2839 Other primary ovarian failure: Secondary | ICD-10-CM

## 2018-04-25 DIAGNOSIS — F0390 Unspecified dementia without behavioral disturbance: Secondary | ICD-10-CM | POA: Diagnosis not present

## 2018-04-25 DIAGNOSIS — F03A0 Unspecified dementia, mild, without behavioral disturbance, psychotic disturbance, mood disturbance, and anxiety: Secondary | ICD-10-CM

## 2018-04-25 DIAGNOSIS — J3489 Other specified disorders of nose and nasal sinuses: Secondary | ICD-10-CM | POA: Diagnosis not present

## 2018-04-25 DIAGNOSIS — E785 Hyperlipidemia, unspecified: Secondary | ICD-10-CM

## 2018-04-25 MED ORDER — OLOPATADINE HCL 0.2 % OP SOLN: OPHTHALMIC | 0 refills | Status: DC

## 2018-04-25 MED ORDER — ZOSTER VAC RECOMB ADJUVANTED 50 MCG/0.5ML IM SUSR: 0.5000 mL | Freq: Once | INTRAMUSCULAR | 1 refills | Status: AC

## 2018-04-25 NOTE — Patient Instructions (Signed)
[name] Morton , Thank you for taking time to come for your Medicare Wellness Visit. I appreciate your ongoing commitment to your health goals. Please review the following plan we discussed and let me know if I can assist you in the future.   Screening recommendations/referrals: Colonoscopy aged out Mammogram aged out but can get if you would like  Bone Density order placed Recommended yearly ophthalmology/optometry visit for glaucoma screening and checkup Recommended yearly dental visit for hygiene and checkup  Vaccinations: Influenza vaccine up to date Pneumococcal vaccine up to date  Tdap vaccine- ALLERGY Shingles vaccine - rx printed     Advanced directives: on file   Conditions/risks identified: progressive memory loss   Next appointment: 1 year AWV    Preventive Care 65 Years and Older, Female Preventive care refers to lifestyle choices and visits with your health care provider that can promote health and wellness. What does preventive care include?  A yearly physical exam. This is also called an annual well check.  Dental exams once or twice a year.  Routine eye exams. Ask your health care provider how often you should have your eyes checked.  Personal lifestyle choices, including:  Daily care of your teeth and gums.  Regular physical activity.  Eating a healthy diet.  Avoiding tobacco and drug use.  Limiting alcohol use.  Practicing safe sex.  Taking low-dose aspirin every day.  Taking vitamin and mineral supplements as recommended by your health care provider. What happens during an annual well check? The services and screenings done by your health care provider during your annual well check will depend on your age, overall health, lifestyle risk factors, and family history of disease. Counseling  Your health care provider may ask you questions about your:  Alcohol use.  Tobacco use.  Drug use.  Emotional well-being.  Home and relationship  well-being.  Sexual activity.  Eating habits.  History of falls.  Memory and ability to understand (cognition).  Work and work Statistician.  Reproductive health. Screening  You may have the following tests or measurements:  Height, weight, and BMI.  Blood pressure.  Lipid and cholesterol levels. These may be checked every 5 years, or more frequently if you are over 64 years old.  Skin check.  Lung cancer screening. You may have this screening every year starting at age 51 if you have a 30-pack-year history of smoking and currently smoke or have quit within the past 15 years.  Fecal occult blood test (FOBT) of the stool. You may have this test every year starting at age 76.  Flexible sigmoidoscopy or colonoscopy. You may have a sigmoidoscopy every 5 years or a colonoscopy every 10 years starting at age 51.  Hepatitis C blood test.  Hepatitis B blood test.  Sexually transmitted disease (STD) testing.  Diabetes screening. This is done by checking your blood sugar (glucose) after you have not eaten for a while (fasting). You may have this done every 1-3 years.  Bone density scan. This is done to screen for osteoporosis. You may have this done starting at age 57.  Mammogram. This may be done every 1-2 years. Talk to your health care provider about how often you should have regular mammograms. Talk with your health care provider about your test results, treatment options, and if necessary, the need for more tests. Vaccines  Your health care provider may recommend certain vaccines, such as:  Influenza vaccine. This is recommended every year.  Tetanus, diphtheria, and acellular pertussis (Tdap,  Td) vaccine. You may need a Td booster every 10 years.  Zoster vaccine. You may need this after age 102.  Pneumococcal 13-valent conjugate (PCV13) vaccine. One dose is recommended after age 53.  Pneumococcal polysaccharide (PPSV23) vaccine. One dose is recommended after age  67. Talk to your health care provider about which screenings and vaccines you need and how often you need them. This information is not intended to replace advice given to you by your health care provider. Make sure you discuss any questions you have with your health care provider. Document Released: 03/15/2015 Document Revised: 11/06/2015 Document Reviewed: 12/18/2014 Elsevier Interactive Patient Education  2017 [place] Prevention in the Home Falls can cause injuries. They can happen to people of all ages. There are many things you can do to make your home safe and to help prevent falls. What can I do on the outside of my home?  Regularly fix the edges of walkways and driveways and fix any cracks.  Remove anything that might make you trip as you walk through a door, such as a raised step or threshold.  Trim any bushes or trees on the path to your home.  Use bright outdoor lighting.  Clear any walking paths of anything that might make someone trip, such as rocks or tools.  Regularly check to see if handrails are loose or broken. Make sure that both sides of any steps have handrails.  Any raised decks and porches should have guardrails on the edges.  Have any leaves, snow, or ice cleared regularly.  Use sand or salt on walking paths during winter.  Clean up any spills in your garage right away. This includes oil or grease spills. What can I do in the bathroom?  Use night lights.  Install grab bars by the toilet and in the tub and shower. Do not use towel bars as grab bars.  Use non-skid mats or decals in the tub or shower.  If you need to sit down in the shower, use a plastic, non-slip stool.  Keep the floor dry. Clean up any water that spills on the floor as soon as it happens.  Remove soap buildup in the tub or shower regularly.  Attach bath mats securely with double-sided non-slip rug tape.  Do not have throw rugs and other things on the floor that can make  you trip. What can I do in the bedroom?  Use night lights.  Make sure that you have a light by your bed that is easy to reach.  Do not use any sheets or blankets that are too big for your bed. They should not hang down onto the floor.  Have a firm chair that has side arms. You can use this for support while you get dressed.  Do not have throw rugs and other things on the floor that can make you trip. What can I do in the kitchen?  Clean up any spills right away.  Avoid walking on wet floors.  Keep items that you use a lot in easy-to-reach places.  If you need to reach something above you, use a strong step stool that has a grab bar.  Keep electrical cords out of the way.  Do not use floor polish or wax that makes floors slippery. If you must use wax, use non-skid floor wax.  Do not have throw rugs and other things on the floor that can make you trip. What can I do with my stairs?  Do not  leave any items on the stairs.  Make sure that there are handrails on both sides of the stairs and use them. Fix handrails that are broken or loose. Make sure that handrails are as long as the stairways.  Check any carpeting to make sure that it is firmly attached to the stairs. Fix any carpet that is loose or worn.  Avoid having throw rugs at the top or bottom of the stairs. If you do have throw rugs, attach them to the floor with carpet tape.  Make sure that you have a light switch at the top of the stairs and the bottom of the stairs. If you do not have them, ask someone to add them for you. What else can I do to help prevent falls?  Wear shoes that:  Do not have high heels.  Have rubber bottoms.  Are comfortable and fit you well.  Are closed at the toe. Do not wear sandals.  If you use a stepladder:  Make sure that it is fully opened. Do not climb a closed stepladder.  Make sure that both sides of the stepladder are locked into place.  Ask someone to hold it for you, if  possible.  Clearly mark and make sure that you can see:  Any grab bars or handrails.  First and last steps.  Where the edge of each step is.  Use tools that help you move around (mobility aids) if they are needed. These include:  Canes.  Walkers.  Scooters.  Crutches.  Turn on the lights when you go into a dark area. Replace any light bulbs as soon as they burn out.  Set up your furniture so you have a clear path. Avoid moving your furniture around.  If any of your floors are uneven, fix them.  If there are any pets around you, be aware of where they are.  Review your medicines with your doctor. Some medicines can make you feel dizzy. This can increase your chance of falling. Ask your doctor what other things that you can do to help prevent falls. This information is not intended to replace advice given to you by your health care provider. Make sure you discuss any questions you have with your health care provider. Document Released: 12/13/2008 Document Revised: 07/25/2015 Document Reviewed: 03/23/2014 Elsevier Interactive Patient Education  2017 [employer].

## 2018-04-25 NOTE — Patient Instructions (Signed)
Start claritin 10 mg by mouth daily   To flonase 1 spray into both nares daily   To use eye drops daily for 7 days then as needed

## 2018-04-25 NOTE — Progress Notes (Signed)
Subjective:   [name] Morton is a 85 y.o. female who presents for Medicare Annual (Subsequent) preventive examination.  Review of Systems:   Cardiac Risk Factors include: advanced age (>17men, >64 women);dyslipidemia;sedentary lifestyle     Objective:     Vitals: BP 134/62   Pulse 65   Temp 97.9 F (36.6 C) (Oral)   Ht 5' (1.524 m)   Wt 140 lb 12.8 oz (63.9 kg)   SpO2 95%   BMI 27.50 kg/m   Body mass index is 27.5 kg/m.  Advanced Directives 04/25/2018 03/01/2018 01/11/2018 07/20/2016 05/29/2015 03/31/2014 05/01/2013  Does Patient Have a Medical Advance Directive? Yes Yes Yes Yes Yes No Patient would like information;Patient does not have advance directive  Type of Advance Directive Healthcare Power of [place] will Living will - Other (Comment)  Does patient want to make changes to medical advance directive? No - Patient declined Yes (Inpatient - patient defers changing a medical advance directive at this time) - No - Patient declined No - Patient declined - -  Copy of [place] in Chart? - Yes - validated most recent copy scanned in chart (See row information) Yes - validated most recent copy scanned in chart (See row information) - No - copy requested - Copy requested from family  Would patient like information on creating a medical advance directive? - - - - - Yes - Spiritual care consult ordered -  Pre-existing out of facility DNR order (yellow form or pink MOST form) - - - - - - No    Tobacco Social History   Tobacco Use  Smoking Status Former Smoker  . Years: 5.00  Smokeless Tobacco Never Used     Counseling given: Not Answered   Clinical Intake:  Pre-visit preparation completed: Yes  Pain : 0-10 Pain Score: 8  Pain Type: Acute pain Pain Location: Head Pain Orientation: Other (Comment) Pain Descriptors / Indicators: Aching Pain Onset: Yesterday Pain Frequency:  Constant Pain Relieving Factors: nothing Effect of Pain on Daily Activities: causing her to be dizzy and unable to move around as good.   Pain Relieving Factors: nothing  BMI - recorded: 27.5 Nutritional Status: BMI 25 -29 Overweight Nutritional Risks: None Diabetes: No  How often do you need to have someone help you when you read instructions, pamphlets, or other written materials from your doctor or pharmacy?: 3 - Sometimes What is the last grade level you completed in school?: 12 grade and nursing school.   Interpreter Needed?: No     Past Medical History:  Diagnosis Date  . Breast cancer ([facility]) 1997   right - tx with lumpectomty & radiation  . Burning sensation of feet    Per New Patient Packet-PSC   . Chronic anxiety   . Chronic kidney disease    Per New Patient Packet-PSC   . Cognitive impairment    Per New Patient Packet-PSC   . Diverticulosis   . Dyslipidemia   . Fluid retention    Per New Patient Packet-PSC   . Gait difficulty   . GERD (gastroesophageal reflux disease)    RARE - NO MEDICATIONS  . Hearing impairment    BILATERAL HEARING AIDS  . Heart murmur    Per New Patient Packet-PSC   . High cholesterol    Per New Patient Packet-PSC   . History of nuclear stress test 11/2009   dipyridamole; no evidence of ischemia, normal, low risk   .  History of shingles 1998   NO RESIDUAL PROBLEMS  . Hypertension    SEES CARDIOLOGIST DR. DAVID HARDING FOR HYPERTENSION   . Insomnia   . Lymphedema of arm    RIGHT   . Mitral and aortic regurgitation   . OA (osteoarthritis)    OA AND PAIN LEFT KNEE  . Osteopenia   . Scoliosis    Per New Patient Packet-PSC   . Shortness of breath    WHEN CLIMBING STEPS AT MY HOME--OTHERWISE OK  . Sinus problem   . Sleep difficulties    ALWAYS HAD SLEEPING PROBLEMS - MEDICATION HELPS PT SLEEP ABOUT 3 HOURS  . SOB (shortness of breath)    Per New Patient Packet-PSC, hospitalized 2018    Past Surgical History:  Procedure  Laterality Date  . ABDOMINAL HYSTERECTOMY  1973  . APPENDECTOMY  1951  . BREAST ADENOMA  1950   EXCISED  . BREAST LUMPECTOMY Right 1997  . COLONOSCOPY  2014   Dr.Sam Penelope Coop, Per New Patient Packet-PSC   . EYE SURGERY     BILATERAL CATARACT EXTRACTIONS AND LENS IMPLANTS  . NM MYOVIEW LTD  08/2016   Hyperdynamic LV function EF 65%. No ischemia or infarction. LOW RISK  . REPLACEMENT TOTAL KNEE  2002  . ROTATOR CUFF REPAIR Right 12/2009   Dr. Lenna Sciara. [place]   . TOTAL KNEE ARTHROPLASTY Left 05/01/2013   Procedure: LEFT TOTAL KNEE ARTHROPLASTY;  Surgeon: Gearlean Alf, MD;  Location: [facility];  Service: Orthopedics;  Laterality: Left;  . TRANSTHORACIC ECHOCARDIOGRAM  05/1994   trace mitral/tricuspic/aortic inusff; hyperkinetic LV  . TRANSTHORACIC ECHOCARDIOGRAM  08/2016   Normal LV size. EF 65-70%. GR 1 DD. (Normal for age). Mild aortic stenosis   Family History  Problem Relation Age of Onset  . Heart attack Mother   . Arthritis Mother   . Dementia Mother   . Heart Problems Father   . Stroke Father   . Heart Problems Maternal Grandmother   . Heart Problems Maternal Grandfather   . Asthma Maternal Grandfather   . Heart attack Brother 52  . Heart disease Brother        CABG  . Lung cancer Sister   . Emphysema Sister   . Cancer Sister   . Narcolepsy Child   . Arthritis Child   . High blood pressure Child   . Alcoholism Brother   . Post-traumatic stress disorder Brother   . COPD Sister   . Seizures Sister   . Heart Problems Sister    Social History   Socioeconomic History  . Marital status: Widowed    Spouse name: Not on file  . Number of children: 2  . Years of education: Not on file  . Highest education level: Not on file  Occupational History  . Occupation: retired Animal nutritionist  . Financial resource strain: Not on file  . Food insecurity:    Worry: Not on file    Inability: Not on file  . Transportation needs:    Medical: Not on file    Non-medical: Not on file   Tobacco Use  . Smoking status: Former Smoker    Years: 5.00  . Smokeless tobacco: Never Used  Substance and Sexual Activity  . Alcohol use: No  . Drug use: No  . Sexual activity: Not on file  Lifestyle  . Physical activity:    Days per week: Not on file    Minutes per session: Not on file  . Stress:  Not on file  Relationships  . Social connections:    Talks on phone: Not on file    Gets together: Not on file    Attends religious service: Not on file    Active member of club or organization: Not on file    Attends meetings of clubs or organizations: Not on file    Relationship status: Not on file  Other Topics Concern  . Not on file  Social History Narrative   Widowed since 11/2010.  Mother of 2.  No real exercise due to knee.   Smoki: No   EtOH: NO      As of 07/30/17-PSC:   Diet: N/A      Caffeine: Coffee, Dr.Pepper      Married, if yes what year: Widowed      Do you live in a house, apartment, assisted living, condo, trailer, ect: House, more than 1 stories, split level 1 person       Pets: No       Highest Level of education: Nursing School in [place], Therapist, sports       Current/Past profession: RN      Exercise: No         Living Will: Yes   DNR: No   POA/HPOA: Yes      Functional Status:   Do you have difficulty bathing or dressing yourself? No   Do you have difficulty preparing food or eating? Yes   Do you have difficulty managing your medications? Yes   Do you have difficulty managing your finances? Yes   Do you have difficulty affording your medications? Yes    Outpatient Encounter Medications as of 04/25/2018  Medication Sig  . acetaminophen (TYLENOL) 650 MG CR tablet Take 1,300 mg by mouth 2 (two) times daily.   [name] Kitchen amLODipine-benazepril (LOTREL) 10-20 MG capsule Take 1 capsule by mouth daily.  . bisoprolol (ZEBETA) 5 MG tablet Take 1 tablet (5 mg total) by mouth daily.  . Calcium Carb-Cholecalciferol (CALCIUM 600+D3) 600-800 MG-UNIT TABS Take 1 tablet by  mouth daily.   [name] Kitchen donepezil (ARICEPT) 5 MG tablet Take 1 tablet (5 mg total) by mouth at bedtime. For memory loss  . furosemide (LASIX) 40 MG tablet TAKE 40 MG BY MOUTH DAILY AND MAY TAKE AN EXTRA 20 MG ( 1/2 TABLET) IF NEEDED FOR PUFFINESS  . gabapentin (NEURONTIN) 100 MG capsule Take 3 capsules every night  . ibuprofen (ADVIL,MOTRIN) 200 MG tablet Take 400 mg by mouth every 6 (six) hours as needed. Take 2 tablets to = 400 mg every 6 hours prn  . Liniments (SALONPAS EX) Apply 1 application topically as needed (back pain).   . memantine (NAMENDA) 10 MG tablet Take 1 tablet (10 mg total) by mouth 2 (two) times daily.  . Menthol, Topical Analgesic, (BIOFREEZE COLORLESS) 4 % GEL Apply 1 application topically 3 (three) times daily. Massage into right arm three times a day for pain.  . Multiple Vitamins-Minerals (MULTIVITAMIN WOMEN 50+) TABS Take by mouth daily.  . simvastatin (ZOCOR) 20 MG tablet Take 1 tablet (20 mg total) by mouth at bedtime.  . [DISCONTINUED] Melatonin 5 MG TABS Take by mouth at bedtime.   No facility-administered encounter medications on file as of 04/25/2018.     Activities of Daily Living In your present state of health, do you have any difficulty performing the following activities: 04/25/2018  Hearing? Y  Comment has hearing aides but up to the highest setting. plans to see audiologist  Vision? N  Difficulty concentrating or making decisions? Y  Walking or climbing stairs? Y  Comment has cane  Dressing or bathing? N  Doing errands, shopping? Y  Preparing Food and eating ? Y  Comment family helps her  Using the Toilet? N  In the past six months, have you accidently leaked urine? N  Do you have problems with loss of bowel control? Y  Managing your Medications? Y  Managing your Finances? Y  Housekeeping or managing your Housekeeping? Y  Some recent data might be hidden    Patient Care Team: [name], NP as PCP - General (Geriatric Medicine) Wonda Horner, MD as Consulting Physician (Gastroenterology) Gaynelle Arabian, MD as Consulting Physician (Orthopedic Surgery) Leonie Man, MD as Consulting Physician (Cardiology) Shon Hough, MD as Consulting Physician (Ophthalmology)    Assessment:   This is a routine wellness examination for Nadene.  Exercise Activities and Dietary recommendations Current Exercise Habits: The patient does not participate in regular exercise at present, Exercise limited by: psychological condition(s)  Goals    . Patient Stated     Maintain current level of health.        Fall Risk Fall Risk  04/25/2018 04/05/2018 01/11/2018 11/17/2017 08/17/2017  Falls in the past year? 0 0 0 Yes No  Number falls in past yr: 0 0 - 1 -  Injury with Fall? 0 0 - Yes -  Comment - - - Injured left side  -   Is the patient's home free of loose throw rugs in walkways, pet beds, electrical cords, etc?   yes      Grab bars in the bathroom? yes      Handrails on the stairs?   yes      Adequate lighting?   yes  Timed Get Up and Go performed: na  Depression Screen PHQ 2/9 Scores 04/25/2018  PHQ - 2 Score 0     Cognitive Function MMSE - Mini Mental State Exam 04/25/2018  Orientation to time 3  Orientation to Place 2  Registration 3  Attention/ Calculation 5  Recall 0  Language- name 2 objects 2  Language- repeat 1  Language- follow 3 step command 3  Language- read & follow direction 0  Write a sentence 1  Copy design 1  Total score 21   Montreal Cognitive Assessment  04/08/2018 08/17/2017  Visuospatial/ Executive (0/5) 2 5  Naming (0/3) 3 2  Attention: Read list of digits (0/2) 1 2  Attention: Read list of letters (0/1) 1 1  Attention: Serial 7 subtraction starting at 100 (0/3) 0 3  Language: Repeat phrase (0/2) 0 2  Language : Fluency (0/1) 0 0  Abstraction (0/2) 0 2  Delayed Recall (0/5) 0 0  Orientation (0/6) 5 4  Total 12 21      Immunization History  Administered Date(s) Administered  . Influenza,  High Dose Seasonal PF 12/23/2015, 11/29/2017  . Influenza-Unspecified 11/30/2016  . Pneumococcal Conjugate-13 07/06/2013  . Pneumococcal Polysaccharide-23 05/05/2004  . Tetanus 01/27/2002  . Zoster 09/16/2007    Qualifies for Shingles Vaccine? Yes   Screening Tests Health Maintenance  Topic Date Due  . DEXA SCAN  08/07/1993  . TETANUS/TDAP  01/28/2012  . INFLUENZA VACCINE  Completed  . PNA vac Low Risk Adult  Completed    Cancer Screenings: Lung: Low Dose CT Chest recommended if Age 69-80 years, 30 pack-year currently smoking OR have quit w/in 15years. Patient does not qualify. Breast:  Up to date on Mammogram?  No   Up to date of Bone Density/Dexa? No Colorectal: aged out.   Additional Screenings:: Hepatitis C Screening:      Plan:      I have personally reviewed and noted the following in the patient's chart:   . Medical and social history . Use of alcohol, tobacco or illicit drugs  . Current medications and supplements . Functional ability and status . Nutritional status . Physical activity . Advanced directives . List of other physicians . Hospitalizations, surgeries, and ER visits in previous 12 months . Vitals . Screenings to include cognitive, depression, and falls . Referrals and appointments  In addition, I have reviewed and discussed with patient certain preventive protocols, quality metrics, and best practice recommendations. A written personalized care plan for preventive services as well as general preventive health recommendations were provided to patient.     [name], NP  04/25/2018

## 2018-04-25 NOTE — Progress Notes (Signed)
Careteam: Patient Care Team: [name], NP as PCP - General (Geriatric Medicine) Wonda Horner, MD as Consulting Physician (Gastroenterology) Gaynelle Arabian, MD as Consulting Physician (Orthopedic Surgery) Leonie Man, MD as Consulting Physician (Cardiology) Shon Hough, MD as Consulting Physician (Ophthalmology)  Advanced Directive information    Allergies  Allergen Reactions  . Benadryl [Diphenhydramine Hcl (Sleep)]     Makes me hyper   . Cardizem [Diltiazem Hcl]     Doesn't remember. Dr Rex Kras told patient not to take.   [name] Kitchen Ceftin [Cefuroxime Axetil] Other (See Comments)    Stomach Issues   . Epinephrine Other (See Comments)    unknown  . Oxycodone Hcl     Loss of appetite: sores on tongue and mouth  . Tetanus Toxoids Other (See Comments)    Unknown   . Tramadol Hcl     Loss of appetite: dry mouth/sores on tongue and mouth  . Vistaril [Hydroxyzine] Other (See Comments)    unknown  . Vytorin [Ezetimibe-Simvastatin]     Not comfortable taking it. Doesn't remember reaction.     Chief Complaint  Patient presents with  . Medical Management of Chronic Issues    roaring in head,runny nose,headache started 04/24/18,will get Dexa scan from old office and date of tdap vaccine,has an eye doctor     HPI: Patient is a 85 y.o. female seen in the office today routine follow up.   She has short term memory loss - had MMSE that showed mild dementia. She was started on low dose aricept. She is now out of medication. She is still driving but does not like to. has followed with neurologist. Taking aricept and namenda now (recently started).   Neuropathy- controlled on gabapentin.  She has chronic rhinorrhea - past allergy testing neg. Zyrtec does not work. Has not tired claritin.   HTN/chronic dHF - BP stable on zebeta, lasix, lotrel. 2D echo in 08/2016 revealed nml EF; mild LVH; grade 1 DD; mild AS/AR; mild MR; mild increased pulmonary artery pressure at  32m Hg. Followed by cardio Dr HEllyn Hackin the past, not seen cardiologist recently. No increase in shortness of breath, chest pains.   Hyperlipidemia - takes zocor daily  insomnia- chronic issue since childhood. She watches TV most of the night and gets approx 1-2 hrs/night of sleep. Takes melatonin aqhs and prn trazodone  Hx right breast CA - s/p lumpectomy with AND and XRT. She has chronic RUE lymphedema.  She is a retired RTherapist, sports  Noted a headache today. Increase sinus congestion. Increase in dizziness today. States that there is a roaring in her ear. She has hearing aid. She wears 24/7. Daughter thinks that something maybe wrong with them.     Review of Systems:  Review of Systems  Constitutional: Negative for chills and fever.  HENT: Positive for congestion and hearing loss.   Eyes:       Itchy eyes, clear discharge  Respiratory: Negative for shortness of breath.   Cardiovascular: Negative for chest pain, palpitations and leg swelling.  Gastrointestinal: Negative for abdominal pain, constipation and diarrhea.  Musculoskeletal: Positive for back pain and myalgias. Negative for falls.  Neurological: Positive for tingling. Negative for dizziness and headaches.  Psychiatric/Behavioral: Positive for memory loss. The patient has insomnia.     Past Medical History:  Diagnosis Date  . [place]   right - tx with lumpectomty & radiation  . Burning sensation of feet    Per New Patient Packet-PSC   .  Chronic anxiety   . Chronic kidney disease    Per New Patient Packet-PSC   . Cognitive impairment    Per New Patient Packet-PSC   . Diverticulosis   . Dyslipidemia   . Fluid retention    Per New Patient Packet-PSC   . Gait difficulty   . GERD (gastroesophageal reflux disease)    RARE - NO MEDICATIONS  . Hearing impairment    BILATERAL HEARING AIDS  . Heart murmur    Per New Patient Packet-PSC   . High cholesterol    Per New Patient Packet-PSC   . History of  nuclear stress test 11/2009   dipyridamole; no evidence of ischemia, normal, low risk   . History of shingles 1998   NO RESIDUAL PROBLEMS  . Hypertension    SEES CARDIOLOGIST DR. DAVID HARDING FOR HYPERTENSION   . Insomnia   . Lymphedema of arm    RIGHT   . Mitral and aortic regurgitation   . OA (osteoarthritis)    OA AND PAIN LEFT KNEE  . Osteopenia   . Scoliosis    Per New Patient Packet-PSC   . Shortness of breath    WHEN CLIMBING STEPS AT MY HOME--OTHERWISE OK  . Sinus problem   . Sleep difficulties    ALWAYS HAD SLEEPING PROBLEMS - MEDICATION HELPS PT SLEEP ABOUT 3 HOURS  . SOB (shortness of breath)    Per New Patient Packet-PSC, hospitalized 2018    Past Surgical History:  Procedure Laterality Date  . ABDOMINAL HYSTERECTOMY  1973  . APPENDECTOMY  1951  . BREAST ADENOMA  1950   EXCISED  . BREAST LUMPECTOMY Right 1997  . COLONOSCOPY  2014   Dr.Sam Penelope Coop, Per New Patient Packet-PSC   . EYE SURGERY     BILATERAL CATARACT EXTRACTIONS AND LENS IMPLANTS  . NM MYOVIEW LTD  08/2016   Hyperdynamic LV function EF 65%. No ischemia or infarction. LOW RISK  . REPLACEMENT TOTAL KNEE  2002  . ROTATOR CUFF REPAIR Right 12/2009   Dr. Lenna Sciara. [place]   . TOTAL KNEE ARTHROPLASTY Left 05/01/2013   Procedure: LEFT TOTAL KNEE ARTHROPLASTY;  Surgeon: Gearlean Alf, MD;  Location: [facility];  Service: Orthopedics;  Laterality: Left;  . TRANSTHORACIC ECHOCARDIOGRAM  05/1994   trace mitral/tricuspic/aortic inusff; hyperkinetic LV  . TRANSTHORACIC ECHOCARDIOGRAM  08/2016   Normal LV size. EF 65-70%. GR 1 DD. (Normal for age). Mild aortic stenosis   Social History:   reports that she has quit smoking. She quit after 5.00 years of use. She has never used smokeless tobacco. She reports that she does not drink alcohol or use drugs.  Family History  Problem Relation Age of Onset  . Heart attack Mother   . Arthritis Mother   . Dementia Mother   . Heart Problems Father   . Stroke Father   . Heart  Problems Maternal Grandmother   . Heart Problems Maternal Grandfather   . Asthma Maternal Grandfather   . Heart attack Brother 26  . Heart disease Brother        CABG  . Lung cancer Sister   . Emphysema Sister   . Cancer Sister   . Narcolepsy Child   . Arthritis Child   . High blood pressure Child   . Alcoholism Brother   . Post-traumatic stress disorder Brother   . COPD Sister   . Seizures Sister   . Heart Problems Sister     Medications: Patient's Medications  New Prescriptions  No medications on file  Previous Medications   ACETAMINOPHEN (TYLENOL) 650 MG CR TABLET    Take 1,300 mg by mouth 2 (two) times daily.    AMLODIPINE-BENAZEPRIL (LOTREL) 10-20 MG CAPSULE    Take 1 capsule by mouth daily.   BISOPROLOL (ZEBETA) 5 MG TABLET    Take 1 tablet (5 mg total) by mouth daily.   CALCIUM CARB-CHOLECALCIFEROL (CALCIUM 600+D3) 600-800 MG-UNIT TABS    Take 1 tablet by mouth daily.    DONEPEZIL (ARICEPT) 5 MG TABLET    Take 1 tablet (5 mg total) by mouth at bedtime. For memory loss   FUROSEMIDE (LASIX) 40 MG TABLET    TAKE 40 MG BY MOUTH DAILY AND MAY TAKE AN EXTRA 20 MG ( 1/2 TABLET) IF NEEDED FOR PUFFINESS   GABAPENTIN (NEURONTIN) 100 MG CAPSULE    Take 3 capsules every night   IBUPROFEN (ADVIL,MOTRIN) 200 MG TABLET    Take 400 mg by mouth every 6 (six) hours as needed. Take 2 tablets to = 400 mg every 6 hours prn   LINIMENTS (SALONPAS EX)    Apply 1 application topically as needed (back pain).    MEMANTINE (NAMENDA) 10 MG TABLET    Take 1 tablet (10 mg total) by mouth 2 (two) times daily.   MENTHOL, TOPICAL ANALGESIC, (BIOFREEZE COLORLESS) 4 % GEL    Apply 1 application topically 3 (three) times daily. Massage into right arm three times a day for pain.   MULTIPLE VITAMINS-MINERALS (MULTIVITAMIN WOMEN 50+) TABS    Take by mouth daily.   SIMVASTATIN (ZOCOR) 20 MG TABLET    Take 1 tablet (20 mg total) by mouth at bedtime.  Modified Medications   No medications on file  Discontinued  Medications   No medications on file     Physical Exam:  Vitals:   04/25/18 1502  BP: 134/62  Pulse: 65  Temp: 97.9 F (36.6 C)  TempSrc: Oral  SpO2: 95%  Weight: 140 lb 12.8 oz (63.9 kg)  Height: 5' (1.524 m)   Body mass index is 27.5 kg/m.  Physical Exam Constitutional:      General: She is not in acute distress.    Appearance: She is well-developed. She is not diaphoretic.  HENT:     Head: Normocephalic and atraumatic.     Right Ear: Tympanic membrane and ear canal normal.     Left Ear: Tympanic membrane and ear canal normal.     Mouth/Throat:     Pharynx: No oropharyngeal exudate.  Eyes:     Conjunctiva/sclera: Conjunctivae normal.     Pupils: Pupils are equal, round, and reactive to light.  Neck:     Musculoskeletal: Normal range of motion and neck supple.  Cardiovascular:     Rate and Rhythm: Normal rate and regular rhythm.     Heart sounds: Normal heart sounds.  Pulmonary:     Effort: Pulmonary effort is normal.     Breath sounds: Normal breath sounds.  Abdominal:     General: Bowel sounds are normal.     Palpations: Abdomen is soft.  Musculoskeletal:     Comments: Reports tenderness down right arm and in hand. Mild edema noted.   Skin:    General: Skin is warm and dry.  Neurological:     Mental Status: She is alert and oriented to person, place, and time.  Psychiatric:        Cognition and Memory: Memory is impaired.    Labs reviewed: Basic Metabolic Panel:  Recent Labs    11/10/17 1225 11/17/17 1225 11/17/17 1622  NA  --  142 143  K  --  4.7 4.5  CL  --  101 102  CO2  --  30 27  GLUCOSE  --  134 148*  BUN  --  28* 26*  CREATININE  --  1.36* 1.45*  CALCIUM  --  11.1* 10.2  TSH 2.49  --   --    Liver Function Tests: Recent Labs    11/17/17 1225  AST 17  ALT 15  BILITOT 0.4  PROT 7.3   No results for input(s): LIPASE, AMYLASE in the last 8760 hours. No results for input(s): AMMONIA in the last 8760 hours. CBC: Recent Labs     11/10/17 1225 11/17/17 1622  WBC 7.7 8.0  NEUTROABS 4,158  --   HGB 13.7 13.4  HCT 39.3 40.2  MCV 86.6 91.2  PLT 274 229   Lipid Panel: Recent Labs    11/10/17 1225  CHOL 236*  HDL 77  LDLCALC 131*  TRIG 167*  CHOLHDL 3.1   TSH: Recent Labs    11/10/17 1225  TSH 2.49   A1C: No results found for: HGBA1C   Assessment/Plan 1. Lymphedema of right arm - Ambulatory referral to Physical Therapy for lymphedema treatments   2. Allergic conjunctivitis of both eyes - Olopatadine HCl (PATADAY) 0.2 % SOLN; 1 drop into both eyes daily x 1 week then as needed  Dispense: 2.5 mL; Refill: 0  3. Neuropathy Ongoing,stable,  continues on gabapentin.   4. Essential hypertension Stable, continues on amlodipine-benazepril and lasix.  - CMP with eGFR([facility])  5. Diastolic dysfunction with chronic heart failure (HCC) Stable continues on bisoprolol and lasix   6. Dyslipidemia, goal LDL below 100 LDL at 131, continues on dietary modification with statin  7. Mild dementia (HCC) Stable, ongoing. Continues on aricept and namenda. Lives at home alone with assistance from family.   8. Allergic rhinorrhea  -to try Claritin by mouth daily to help with allergies and to help with noises in ear.   Next appt: 3 month  Sarahi Borland K. [place], [facility] [phone]

## 2018-04-26 LAB — COMPLETE METABOLIC PANEL WITH GFR
AG RATIO: 2.1 (calc) (ref 1.0–2.5)
ALKALINE PHOSPHATASE (APISO): 52 U/L (ref 37–153)
ALT: 13 U/L (ref 6–29)
AST: 14 U/L (ref 10–35)
Albumin: 4.8 g/dL (ref 3.6–5.1)
BILIRUBIN TOTAL: 0.3 mg/dL (ref 0.2–1.2)
BUN/Creatinine Ratio: 20 (calc) (ref 6–22)
BUN: 22 mg/dL (ref 7–25)
CHLORIDE: 103 mmol/L (ref 98–110)
CO2: 30 mmol/L (ref 20–32)
Calcium: 10.4 mg/dL (ref 8.6–10.4)
Creat: 1.1 mg/dL — ABNORMAL HIGH (ref 0.60–0.88)
GFR, EST AFRICAN AMERICAN: 52 mL/min/{1.73_m2} — AB (ref >=60)
GFR, Est Non African American: 44 mL/min/{1.73_m2} — ABNORMAL LOW (ref >=60)
GLUCOSE: 110 mg/dL — AB (ref 65–99)
Globulin: 2.3 g/dL (calc) (ref 1.9–3.7)
POTASSIUM: 4.8 mmol/L (ref 3.5–5.3)
Sodium: 142 mmol/L (ref 135–146)
TOTAL PROTEIN: 7.1 g/dL (ref 6.1–8.1)

## 2018-04-28 ENCOUNTER — Telehealth: Payer: Self-pay

## 2018-04-28 NOTE — Telephone Encounter (Signed)
-----   Message from [name], NP sent at 04/27/2018 12:32 PM EST ----- Reached out to some providers at the cancer center and placed a referral for PT for lymphedema, they should help fit her for sleeve and any other treatment. Glucose, kidneys, electrolytes, liver function remain stable

## 2018-04-28 NOTE — Telephone Encounter (Signed)
When I called patient to go over her lab results. Her daughter stated her mother continues to hear roaring noises in her head and will stop the Namenda and notify the provider that prescribed it, as she thinks this may be the problem.

## 2018-04-29 NOTE — Telephone Encounter (Signed)
Noted, to notify us if symptoms persist or worsen. (if she is still having this after stopping namanda recommend that she restart using titration) to slow progression of memory loss

## 2018-05-10 DIAGNOSIS — H903 Sensorineural hearing loss, bilateral: Secondary | ICD-10-CM | POA: Diagnosis not present

## 2018-05-10 DIAGNOSIS — H9313 Tinnitus, bilateral: Secondary | ICD-10-CM | POA: Diagnosis not present

## 2018-05-13 NOTE — Telephone Encounter (Signed)
Call placed to patient daughter [name] and she states they went to the ENT and was told her mom had tinnitus in her ear which was causing the roaring sound.   [name] is aware of Jessica's [place] recommendations and stated they would start her mom back on the namenda this weekend

## 2018-05-25 ENCOUNTER — Ambulatory Visit: Payer: Medicare Other | Admitting: Physical Therapy

## 2018-06-29 ENCOUNTER — Other Ambulatory Visit: Payer: Self-pay

## 2018-06-29 ENCOUNTER — Ambulatory Visit: Payer: Self-pay

## 2018-07-15 ENCOUNTER — Encounter: Payer: Medicare Other | Admitting: Family

## 2018-07-15 ENCOUNTER — Other Ambulatory Visit: Payer: Self-pay

## 2018-07-15 ENCOUNTER — Telehealth: Payer: Self-pay

## 2018-07-15 ENCOUNTER — Encounter: Payer: Self-pay | Admitting: Family

## 2018-07-15 NOTE — Progress Notes (Signed)
Patient cancelled appointment.

## 2018-07-15 NOTE — Telephone Encounter (Signed)
Patient's daughter called this morning stating that her mom had fell 2 weeks ago in bathroom. [name] went on to say that her mom had been complaining of pain and thought she may need xray to make sure nothing was broken or wrong. Caregiver Almyra Free is there 3 times a week and help care for patient and had reported to daughter about the patient's complaint.   Offered to schedule a telephone visit with patient so that provider could go over information with patient. Daughter agreed to schedule appointment but stated she currently was not in town and would not be able to be present with patient. Her mom suffers from dementia and would not be good historian for medication and other information.   Schedule telephone visit with patient and caregiver Almyra Free. Had to cancel appointment, caregiver was no longer at patient's home and patient decided she did not want to do telephone visit. Stated she was using a heating pad and right now that was helping. Also stated if she does not get any better she will have her daughter call us and set up an appointment. Also did not feel an xray was necessary at this time.

## 2018-07-19 NOTE — Progress Notes (Signed)
This encounter was created in error - please disregard.

## 2018-07-21 ENCOUNTER — Telehealth: Payer: Self-pay | Admitting: Neurology

## 2018-07-21 NOTE — Telephone Encounter (Signed)
Patient's daughter called and is needing to let Dr. Delice Lesch know that her mother is not taking her Namenda. She has a pill box that she uses and she takes her medication but will not take that specific medication. She thinks that medication is what's causing her to have ringing in her ears. Please Call. Thanks

## 2018-07-22 NOTE — Telephone Encounter (Signed)
Pls see if she is willing to take only 1/2 tablet twice a day of the Namenda and see if the ringing still happens even on lower dose? Thanks

## 2018-07-22 NOTE — Telephone Encounter (Signed)
Pt daughter called they will try the 1/2 tab Namenda to see if her mom will take it

## 2018-08-01 ENCOUNTER — Other Ambulatory Visit: Payer: Self-pay | Admitting: *Deleted

## 2018-08-01 MED ORDER — BISOPROLOL FUMARATE 5 MG PO TABS: 5.0000 mg | ORAL_TABLET | Freq: Every day | ORAL | 0 refills | Status: DC

## 2018-08-01 NOTE — Telephone Encounter (Signed)
[facility] Needs an appointment before anymore future refills.

## 2018-08-12 ENCOUNTER — Other Ambulatory Visit: Payer: Self-pay | Admitting: *Deleted

## 2018-08-12 MED ORDER — SIMVASTATIN 20 MG PO TABS: 20.0000 mg | ORAL_TABLET | Freq: Every day | ORAL | 0 refills | Status: DC

## 2018-08-12 MED ORDER — AMLODIPINE BESY-BENAZEPRIL HCL 10-20 MG PO CAPS: 1.0000 | ORAL_CAPSULE | Freq: Every day | ORAL | 0 refills | Status: DC

## 2018-08-26 ENCOUNTER — Other Ambulatory Visit: Payer: Self-pay

## 2018-08-26 ENCOUNTER — Inpatient Hospital Stay: Admission: RE | Admit: 2018-08-26 | Payer: Self-pay | Source: Ambulatory Visit

## 2018-10-26 ENCOUNTER — Other Ambulatory Visit: Payer: Self-pay

## 2018-10-26 ENCOUNTER — Telehealth (INDEPENDENT_AMBULATORY_CARE_PROVIDER_SITE_OTHER): Payer: Medicare Other | Admitting: Neurology

## 2018-10-26 DIAGNOSIS — F0391 Unspecified dementia with behavioral disturbance: Secondary | ICD-10-CM | POA: Diagnosis not present

## 2018-10-26 DIAGNOSIS — F03B18 Unspecified dementia, moderate, with other behavioral disturbance: Secondary | ICD-10-CM

## 2018-10-26 DIAGNOSIS — R519 Headache, unspecified: Secondary | ICD-10-CM

## 2018-10-26 MED ORDER — MIRTAZAPINE 7.5 MG PO TABS: 7.5000 mg | ORAL_TABLET | Freq: Every day | ORAL | 11 refills | Status: DC

## 2018-10-26 NOTE — Progress Notes (Signed)
Virtual Visit via Telephone Note The purpose of this virtual visit is to provide medical care while limiting exposure to the novel coronavirus.    Consent was obtained for phone visit:  Yes.   Answered questions that patient had about telehealth interaction:  Yes.   I discussed the limitations, risks, security and privacy concerns of performing an evaluation and management service by telephone. I also discussed with the patient that there may be a patient responsible charge related to this service. The patient expressed understanding and agreed to proceed.  Pt location: Home Physician Location: office Name of referring provider:  [name], NP I connected with .[name] Morton at patients daughter's initiation/request on 10/26/2018 at 10:30 AM EDT by telephone and verified that I am speaking with the correct person using two identifiers.  Pt MRN:  [id number] Pt DOB:  Sep 06, 1926   History of Present Illness:  The patient had a telephone visit on 10/26/2018. She was last seen in the neurology clinic 6 months ago for dementia. Her daughter [name] Morton is present during the visit to provide additional information. [name] Morton reported significant difficulties with the patient's behaviors. She is taking Donepezil 5mg  daily but refused to take the Memantine because it caused tinnitus. She initially refused to speak to me and refused to do memory testing, however agreed to speak to me about her headaches. She states that she has had more headaches, "I don't feel well at all." She does not sleep at night and states she takes her night pills. She has headaches every morning and takes 2 extra-strength Tylenol for her back, then at 4am and 9am takes Tylenol again for headache. She would take 2 tablets at a time with no relief. No associated nausea/vomiting. Pain is over the frontal region, no vision loss, no temporal tenderness. She has had a headache for the past 2 days and does not feel like getting up. Her  daughter reports she would wake at at 4am the last couple of days. She only sleeps 1-1.5 hours then wakes up again. She takes gabapentin 300mg  qhs.  She has seen the audiologist and ENT for tinnitus and was told Memantine was not the cause, but she still refuses it. [name] Morton spoke to me separately and reports that she has become very aggressive and hostile. She lives alone, they are unsure how she is taking Tylenol. Her daughters fills out her pillbox and when they check behind her, she misses her medications 2-3 times a month. She does not drive. [name] Morton cannot find things in her house, they are unsure if she has thrown things away. She takes her own trash out. [name] Morton does not think she is safe home alone. She wears the same clothes everyday but would dress up to go out. She is still very, very particular about appearance and would look pretty good. She does not cook, she eats potato chips, chocolate, and has left the stove on a few times. [name] Morton is noticing her laundry pile up. She had fired everyone coming to help maintain her house.   Outpatient Encounter Medications as of 10/26/2018  Medication Sig   acetaminophen (TYLENOL) 650 MG CR tablet Take 1,300 mg by mouth 2 (two) times daily.    amLODipine-benazepril (LOTREL) 10-20 MG capsule Take 1 capsule by mouth daily.   Calcium Carb-Cholecalciferol (CALCIUM 600+D3) 600-800 MG-UNIT TABS Take 1 tablet by mouth daily.    donepezil (ARICEPT) 5 MG tablet Take 1 tablet (5 mg total) by mouth at bedtime. For memory  loss   furosemide (LASIX) 40 MG tablet TAKE 40 MG BY MOUTH DAILY AND MAY TAKE AN EXTRA 20 MG ( 1/2 TABLET) IF NEEDED FOR PUFFINESS   gabapentin (NEURONTIN) 100 MG capsule Take 3 capsules every night   Liniments (SALONPAS EX) Apply 1 application topically as needed (back pain).    memantine (NAMENDA) 10 MG tablet Take 1 tablet (10 mg total) by mouth 2 (two) times daily. (Patient not taking: Reported on 10/31/2018)   Menthol, Topical Analgesic,  (BIOFREEZE COLORLESS) 4 % GEL Apply 1 application topically 3 (three) times daily. Massage into right arm three times a day for pain.       simvastatin (ZOCOR) 20 MG tablet Take 1 tablet (20 mg total) by mouth at bedtime.                   No facility-administered encounter medications on file as of 10/26/2018.      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 She reports her memory is bad, she has a lot of problems remembering from things back. She lives alone. Her daughter started noticing memory changes around 2 years ago, more noticeable this past year. She would repeat herself, asking the same questions. Her daughter lives 2 hours away and expressed concern about her medications. She apparently went 3 weeks without 3 medications because her mail delivery did not arrive. Her daughter visits her every 1-2 weeks and noticed she misses her medications when visiting. She denies getting lost driving but kept asking her daughter about the GPS feature on the phone. Her daughter has hired Company secretary to come at home 10 hours a week, they drive her to appointments. Her daughter took over bills this year, she was not comfortable with writing checks, and a lot of times would have a question about a charge. She misplaces things frequently, around 2-3 years ago she would blame her daughter when she would not find them. Her mother had dementia. No history of significant head injuries or alcohol intake. Her daughter has noticed more irritability, stating "mother's always right," and when she thinks she is right and this is contested, she does not take it well. No recent paranoia, no hallucinations. She is able to bathe and dress independently.  She has low back pain, right thumb pain after right arm surgery. She has near-constant burning pain in the bottom of  her feet. She has had occasional loose stools with leakage the past 6 months. Otherwise she denies any headaches, dizziness, vision changes, dysarthria/dysphagia, neck/back pain, bladder dysfunction, anosmia, or tremors. She states she never sleeps, she usually watches TV or reads. She denies daytime drowsiness but her daughter disagrees.   She had an MRI brain with and without contrast done 05/25/17 which did not show any acute changes. There were numerous nonspecific foci of T2 FLAIR hyperintense signal changes in the subcortical and periventricular white matter, moderate diffuse volume loss, no abnormal enhancement.    Observations/Objective:  Limited due to the nature of phone visit. Patient is awake, alert, answers questions. She refused to do memory testing.  Assessment and Plan:   This is a 85 yo RH woman with a history of  history of hypertension, hyperlipidemia, breast cancer, with moderate dementia with behavioral disturbance, likely Alzheimer's disease. Patient refused to do memory testing today (Belcher score 12/30 in February 2020, 21/30 in June 2019). MRI brain  no acute changes. She initially refused to speak to me for her memory issues, but agreed to speak to me about her headaches. She has had an increase in headaches, but has not been sleeping well. Head CT without contrast will be ordered to assess for underlying structural abnormality. We discussed starting mirtazapine 7.5 mg qhs for sleep, this has been reported to help with headaches as well. Side effects discussed, we may uptitrate as tolerated. May consider increasing gabapentin as well if necessary. Continue Donepezil 5mg  daily. I discussed [name] Morton's concerns about her safety at home alone, she was advised to contact the [facility] for further guidance. Follow-up in 6 months, they know to call for any changes.    Follow Up Instructions:   -I discussed the assessment and treatment plan with the patient. The  patient was provided an opportunity to ask questions and all were answered. The patient agreed with the plan and demonstrated an understanding of the instructions.   The patient was advised to call back or seek an in-person evaluation if the symptoms worsen or if the condition fails to improve as anticipated.    Total Time spent in visit with the patient was:  30 minutes, of which 100% of the time was spent in counseling and/or coordinating care on the above.   Pt understands and agrees with the plan of care outlined.     Cameron Sprang, MD

## 2018-10-31 ENCOUNTER — Other Ambulatory Visit: Payer: Self-pay

## 2018-10-31 ENCOUNTER — Ambulatory Visit (INDEPENDENT_AMBULATORY_CARE_PROVIDER_SITE_OTHER): Payer: Medicare Other | Admitting: Nurse Practitioner

## 2018-10-31 VITALS — BP 122/78 | HR 85 | Temp 97.6°F | Resp 18 | Ht 60.0 in | Wt 143.2 lb

## 2018-10-31 DIAGNOSIS — G629 Polyneuropathy, unspecified: Secondary | ICD-10-CM

## 2018-10-31 DIAGNOSIS — E785 Hyperlipidemia, unspecified: Secondary | ICD-10-CM

## 2018-10-31 DIAGNOSIS — Z23 Encounter for immunization: Secondary | ICD-10-CM | POA: Diagnosis not present

## 2018-10-31 DIAGNOSIS — I10 Essential (primary) hypertension: Secondary | ICD-10-CM

## 2018-10-31 DIAGNOSIS — F0391 Unspecified dementia with behavioral disturbance: Secondary | ICD-10-CM | POA: Diagnosis not present

## 2018-10-31 MED ORDER — BISOPROLOL FUMARATE 5 MG PO TABS: 5.0000 mg | ORAL_TABLET | Freq: Every day | ORAL | 1 refills | Status: DC

## 2018-10-31 MED ORDER — BISOPROLOL FUMARATE 5 MG PO TABS: 5.0000 mg | ORAL_TABLET | Freq: Every day | ORAL | 0 refills | Status: DC

## 2018-10-31 MED FILL — BISOPROLOL FUMARATE 5 MG TA: 5 | 30 days supply | Qty: 30 | Fill #0

## 2018-10-31 NOTE — Progress Notes (Signed)
Careteam: Patient Care Team: [name], NP as PCP - General (Geriatric Medicine) Wonda Horner, MD as Consulting Physician (Gastroenterology) Gaynelle Arabian, MD as Consulting Physician (Orthopedic Surgery) Leonie Man, MD as Consulting Physician (Cardiology) Shon Hough, MD as Consulting Physician (Ophthalmology)  Advanced Directive information    Allergies  Allergen Reactions  . Benadryl [Diphenhydramine Hcl (Sleep)]     Makes me hyper   . Cardizem [Diltiazem Hcl]     Doesn't remember. Dr Rex Kras told patient not to take.   [name] Kitchen Ceftin [Cefuroxime Axetil] Other (See Comments)    Stomach Issues   . Epinephrine Other (See Comments)    unknown  . Oxycodone Hcl     Loss of appetite: sores on tongue and mouth  . Tetanus Toxoids Other (See Comments)    Unknown   . Tramadol Hcl     Loss of appetite: dry mouth/sores on tongue and mouth  . Vistaril [Hydroxyzine] Other (See Comments)    unknown  . Vytorin [Ezetimibe-Simvastatin]     Not comfortable taking it. Doesn't remember reaction.     Chief Complaint  Patient presents with  . Follow-up    Blood pressure follow-up      HPI: Patient is a 85 y.o. female seen in the office today for routine follow up. Mad at everything and everybody. Pt is very agitated during visit. Does not like seeing a nurse practitioner, wants to see a doctor. Agreeable to see me today. Reports her daughter took her from her other doctor and brought her here.   Wants to tell a doctor that her nose is running all the time. Daughter reports this has been going on her whole life.  Not taking some of the medication that "they have given me"  Wants more information on the medication that she is taken. Not satisfied with the information that has been given to her. Daughter (who is a Software engineer) states that she does not remember them going over medication. She has also been given the bottle.   Hyperlipidemia- reports she is not sure what  she is taking   Dementia- following with neurologist. Last telemedicine visit last week and  remeron was added to help with sleep and mood however she is not taking this. She has been very aggressive and hostile and does not feel it necessary to see any providers because she does not feel like anything is wrong. Per daughter she will not take memantine but will take aricept.  She is very paranoid about her caregivers, daughters and medication. She has fired a lot of the people coming to help her.  She is allowing family to go to the store for her and help her with her medication box but questionable if she is taking medication routinely.   Hypertension- taking bisoprolol with amlodipine and benazepril    Review of Systems:  Review of Systems  Constitutional: Negative for chills and fever.  HENT: Positive for congestion and hearing loss.   Eyes:       Itchy eyes, clear discharge  Respiratory: Negative for cough and shortness of breath.   Cardiovascular: Negative for chest pain, palpitations and leg swelling.  Gastrointestinal: Negative for abdominal pain, constipation and diarrhea.  Musculoskeletal: Positive for back pain and myalgias. Negative for falls.  Neurological: Positive for tingling. Negative for dizziness and headaches.  Psychiatric/Behavioral: Positive for memory loss. The patient has insomnia.     Past Medical History:  Diagnosis Date  . Breast cancer ([place]) 1997  right - tx with lumpectomty & radiation  . Burning sensation of feet    Per New Patient Packet[facility]   . Chronic anxiety   . Chronic kidney disease    Per New Patient Packet[facility]   . Cognitive impairment    Per New Patient Packet[facility]   . Diverticulosis   . Dyslipidemia   . Fluid retention    Per New Patient Packet[facility]   . Gait difficulty   . GERD (gastroesophageal reflux disease)    RARE - NO MEDICATIONS  . Hearing impairment    BILATERAL HEARING AIDS  . Heart murmur    Per New Patient Packet[facility]   .  High cholesterol    Per New Patient Packet[facility]   . History of nuclear stress test 11/2009   dipyridamole; no evidence of ischemia, normal, low risk   . History of shingles 1998   NO RESIDUAL PROBLEMS  . Hypertension    SEES CARDIOLOGIST DR. DAVID HARDING FOR HYPERTENSION   . Insomnia   . Lymphedema of arm    RIGHT   . Mitral and aortic regurgitation   . OA (osteoarthritis)    OA AND PAIN LEFT KNEE  . Osteopenia   . Scoliosis    Per New Patient Packet-[facility]   . Shortness of breath    WHEN CLIMBING STEPS AT MY HOME--OTHERWISE OK  . Sinus problem   . Sleep difficulties    ALWAYS HAD SLEEPING PROBLEMS - MEDICATION HELPS PT SLEEP ABOUT 3 HOURS  . SOB (shortness of breath)    Per New Patient Packet-[facility], hospitalized 2018    Past Surgical History:  Procedure Laterality Date  . ABDOMINAL HYSTERECTOMY  1973  . APPENDECTOMY  1951  . BREAST ADENOMA  1950   EXCISED  . BREAST LUMPECTOMY Right 1997  . COLONOSCOPY  2014   Dr.Sam Penelope Coop, Per New Patient Packet-[facility]   . EYE SURGERY     BILATERAL CATARACT EXTRACTIONS AND LENS IMPLANTS  . NM MYOVIEW LTD  08/2016   Hyperdynamic LV function EF 65%. No ischemia or infarction. LOW RISK  . REPLACEMENT TOTAL KNEE  2002  . ROTATOR CUFF REPAIR Right 12/2009   Dr. Lenna Sciara. [place]   . TOTAL KNEE ARTHROPLASTY Left 05/01/2013   Procedure: LEFT TOTAL KNEE ARTHROPLASTY;  Surgeon: Gearlean Alf, MD;  Location: [facility];  Service: Orthopedics;  Laterality: Left;  . TRANSTHORACIC ECHOCARDIOGRAM  05/1994   trace mitral/tricuspic/aortic inusff; hyperkinetic LV  . TRANSTHORACIC ECHOCARDIOGRAM  08/2016   Normal LV size. EF 65-70%. GR 1 DD. (Normal for age). Mild aortic stenosis   Social History:   reports that she has quit smoking. She quit after 5.00 years of use. She has never used smokeless tobacco. She reports that she does not drink alcohol or use drugs.  Family History  Problem Relation Age of Onset  . Heart attack Mother   . Arthritis Mother   . Dementia  Mother   . Heart Problems Father   . Stroke Father   . Heart Problems Maternal Grandmother   . Heart Problems Maternal Grandfather   . Asthma Maternal Grandfather   . Heart attack Brother 103  . Heart disease Brother        CABG  . Lung cancer Sister   . Emphysema Sister   . Cancer Sister   . Narcolepsy Child   . Arthritis Child   . High blood pressure Child   . Alcoholism Brother   . Post-traumatic stress disorder Brother   . COPD Sister   .  Seizures Sister   . Heart Problems Sister     Medications: Patient's Medications  New Prescriptions   No medications on file  Previous Medications   ACETAMINOPHEN (TYLENOL) 650 MG CR TABLET    Take 1,300 mg by mouth 2 (two) times daily.    AMLODIPINE-BENAZEPRIL (LOTREL) 10-20 MG CAPSULE    Take 1 capsule by mouth daily.   BISOPROLOL (ZEBETA) 5 MG TABLET    Take 1 tablet (5 mg total) by mouth daily. Needs an appointment before anymore future refills.   CALCIUM CARB-CHOLECALCIFEROL (CALCIUM 600+D3) 600-800 MG-UNIT TABS    Take 1 tablet by mouth daily.    DONEPEZIL (ARICEPT) 5 MG TABLET    Take 1 tablet (5 mg total) by mouth at bedtime. For memory loss   FUROSEMIDE (LASIX) 40 MG TABLET    TAKE 40 MG BY MOUTH DAILY AND MAY TAKE AN EXTRA 20 MG ( 1/2 TABLET) IF NEEDED FOR PUFFINESS   GABAPENTIN (NEURONTIN) 100 MG CAPSULE    Take 3 capsules every night   LINIMENTS (SALONPAS EX)    Apply 1 application topically as needed (back pain).    MEMANTINE (NAMENDA) 10 MG TABLET    Take 1 tablet (10 mg total) by mouth 2 (two) times daily.   MENTHOL, TOPICAL ANALGESIC, (BIOFREEZE COLORLESS) 4 % GEL    Apply 1 application topically 3 (three) times daily. Massage into right arm three times a day for pain.   MIRTAZAPINE (REMERON) 7.5 MG TABLET    Take 1 tablet (7.5 mg total) by mouth at bedtime.   SIMVASTATIN (ZOCOR) 20 MG TABLET    Take 1 tablet (20 mg total) by mouth at bedtime.  Modified Medications   No medications on file  Discontinued Medications    IBUPROFEN (ADVIL,MOTRIN) 200 MG TABLET    Take 400 mg by mouth every 6 (six) hours as needed. Take 2 tablets to = 400 mg every 6 hours prn   MULTIPLE VITAMINS-MINERALS (MULTIVITAMIN WOMEN 50+) TABS    Take by mouth daily.   OLOPATADINE HCL (PATADAY) 0.2 % SOLN    1 drop into both eyes daily x 1 week then as needed    Physical Exam:  Vitals:   10/31/18 1255  BP: 122/78  Pulse: 85  Resp: 18  Temp: 97.6 F (36.4 C)  SpO2: 97%  Weight: 143 lb 3.2 oz (65 kg)  Height: 5' (1.524 m)   Body mass index is 27.97 kg/m. Wt Readings from Last 3 Encounters:  10/31/18 143 lb 3.2 oz (65 kg)  04/25/18 140 lb 12.8 oz (63.9 kg)  04/25/18 140 lb 12.8 oz (63.9 kg)    Physical Exam Constitutional:      General: She is not in acute distress.    Appearance: She is well-developed. She is not diaphoretic.  HENT:     Head: Normocephalic and atraumatic.     Right Ear: Tympanic membrane and ear canal normal.     Left Ear: Tympanic membrane and ear canal normal.     Mouth/Throat:     Pharynx: No oropharyngeal exudate.  Eyes:     Conjunctiva/sclera: Conjunctivae normal.     Pupils: Pupils are equal, round, and reactive to light.  Neck:     Musculoskeletal: Normal range of motion and neck supple.  Cardiovascular:     Rate and Rhythm: Normal rate and regular rhythm.     Heart sounds: Normal heart sounds.  Pulmonary:     Effort: Pulmonary effort is normal.  Breath sounds: Normal breath sounds.  Abdominal:     General: Bowel sounds are normal.     Palpations: Abdomen is soft.  Musculoskeletal:     Right lower leg: No edema.     Left lower leg: No edema.  Skin:    General: Skin is warm and dry.  Neurological:     Mental Status: She is alert and oriented to person, place, and time.  Psychiatric:        Mood and Affect: Affect is angry.        Speech: Speech normal.        Behavior: Behavior is agitated and aggressive.        Thought Content: Thought content is paranoid.        Cognition  and Memory: Memory is impaired.     Labs reviewed: Basic Metabolic Panel: Recent Labs    11/10/17 1225 11/17/17 1225 11/17/17 1622 04/25/18 1616  NA  --  142 143 142  K  --  4.7 4.5 4.8  CL  --  101 102 103  CO2  --  30 27 30   GLUCOSE  --  134 148* 110*  BUN  --  28* 26* 22  CREATININE  --  1.36* 1.45* 1.10*  CALCIUM  --  11.1* 10.2 10.4  TSH 2.49  --   --   --    Liver Function Tests: Recent Labs    11/17/17 1225 04/25/18 1616  AST 17 14  ALT 15 13  BILITOT 0.4 0.3  PROT 7.3 7.1   No results for input(s): LIPASE, AMYLASE in the last 8760 hours. No results for input(s): AMMONIA in the last 8760 hours. CBC: Recent Labs    11/10/17 1225 11/17/17 1622  WBC 7.7 8.0  NEUTROABS 4,158  --   HGB 13.7 13.4  HCT 39.3 40.2  MCV 86.6 91.2  PLT 274 229   Lipid Panel: Recent Labs    11/10/17 1225  CHOL 236*  HDL 77  LDLCALC 131*  TRIG 167*  CHOLHDL 3.1   TSH: Recent Labs    11/10/17 1225  TSH 2.49   A1C: No results found for: HGBA1C   Assessment/Plan 1. Neuropathy -stable on gabepentin - CBC with Differential/Platelet  2. Essential hypertension Stable on current regimen. Will continue bisoprolol, amlodipine-benazepril - CBC with Differential/Platelet - bisoprolol (ZEBETA) 5 MG tablet; Take 1 tablet (5 mg total) by mouth daily.  Dispense: 90 tablet; Refill: 1  3. Dyslipidemia, goal LDL below 100 Continues on zocor, encouraged dietary modifications.  - Lipid Panel - COMPLETE METABOLIC PANEL WITH GFR  4. Need for influenza vaccination - Flu Vaccine QUAD High Dose(Fluad)  5. Dementia with behavioral disturbance, unspecified dementia type ([place]) -progressive memory loss with increase in paranoia, family is working very hard to keep pt safe and provide care but she is refusing medication and caregiver help. Getting very agitated with family and those trying to help her.  -continues on aricept.  She does not wish to see a Designer, jewellery. Will have  her see Dr [name] at next follow up appt.   Next appt: 3 months.  Carlos American. [place], [facility] [phone]

## 2018-11-01 LAB — COMPLETE METABOLIC PANEL WITH GFR
AG Ratio: 1.7 (calc) (ref 1.0–2.5)
ALT: 12 U/L (ref 6–29)
AST: 14 U/L (ref 10–35)
Albumin: 4.7 g/dL (ref 3.6–5.1)
Alkaline phosphatase (APISO): 56 U/L (ref 37–153)
BUN/Creatinine Ratio: 24 (calc) — ABNORMAL HIGH (ref 6–22)
BUN: 27 mg/dL — ABNORMAL HIGH (ref 7–25)
CO2: 33 mmol/L — ABNORMAL HIGH (ref 20–32)
Calcium: 10.8 mg/dL — ABNORMAL HIGH (ref 8.6–10.4)
Chloride: 100 mmol/L (ref 98–110)
Creat: 1.12 mg/dL — ABNORMAL HIGH (ref 0.60–0.88)
GFR, Est African American: 50 mL/min/{1.73_m2} — ABNORMAL LOW (ref >=60)
GFR, Est Non African American: 43 mL/min/{1.73_m2} — ABNORMAL LOW (ref >=60)
Globulin: 2.7 g/dL (calc) (ref 1.9–3.7)
Glucose, Bld: 132 mg/dL — ABNORMAL HIGH (ref 65–99)
Potassium: 4.4 mmol/L (ref 3.5–5.3)
Sodium: 140 mmol/L (ref 135–146)
Total Bilirubin: 0.3 mg/dL (ref 0.2–1.2)
Total Protein: 7.4 g/dL (ref 6.1–8.1)

## 2018-11-01 LAB — LIPID PANEL
Cholesterol: 221 mg/dL — ABNORMAL HIGH (ref <200)
HDL: 68 mg/dL (ref >=50)
LDL Cholesterol (Calc): 114 mg/dL (calc) — ABNORMAL HIGH
Non-HDL Cholesterol (Calc): 153 mg/dL (calc) — ABNORMAL HIGH (ref <130)
Total CHOL/HDL Ratio: 3.3 (calc) (ref <5.0)
Triglycerides: 273 mg/dL — ABNORMAL HIGH (ref <150)

## 2018-11-01 LAB — CBC WITH DIFFERENTIAL/PLATELET
Absolute Monocytes: 502 cells/uL (ref 200–950)
Basophils Absolute: 40 cells/uL (ref 0–200)
Basophils Relative: 0.6 %
Eosinophils Absolute: 132 cells/uL (ref 15–500)
Eosinophils Relative: 2 %
HCT: 37.8 % (ref 35.0–45.0)
Hemoglobin: 12.7 g/dL (ref 11.7–15.5)
Lymphs Abs: 2462 cells/uL (ref 850–3900)
MCH: 30.2 pg (ref 27.0–33.0)
MCHC: 33.6 g/dL (ref 32.0–36.0)
MCV: 90 fL (ref 80.0–100.0)
MPV: 11.5 fL (ref 7.5–12.5)
Monocytes Relative: 7.6 %
Neutro Abs: 3465 cells/uL (ref 1500–7800)
Neutrophils Relative %: 52.5 %
Platelets: 229 10*3/uL (ref 140–400)
RBC: 4.2 10*6/uL (ref 3.80–5.10)
RDW: 13.1 % (ref 11.0–15.0)
Total Lymphocyte: 37.3 %
WBC: 6.6 10*3/uL (ref 3.8–10.8)

## 2018-11-11 ENCOUNTER — Ambulatory Visit: Payer: Self-pay | Admitting: Neurology

## 2018-11-12 ENCOUNTER — Other Ambulatory Visit: Payer: Self-pay | Admitting: Nurse Practitioner

## 2018-11-14 ENCOUNTER — Other Ambulatory Visit: Payer: Medicare Other

## 2018-11-14 NOTE — Telephone Encounter (Signed)
High risk or very high risk warning populated when attempting to refill medication. RX request sent to PCP for review and approval if warranted.   

## 2018-11-17 ENCOUNTER — Other Ambulatory Visit: Payer: Self-pay | Admitting: *Deleted

## 2018-11-17 MED ORDER — FUROSEMIDE 40 MG PO TABS: ORAL_TABLET | ORAL | 3 refills | Status: DC

## 2018-11-17 NOTE — Telephone Encounter (Signed)
[facility]

## 2019-01-02 ENCOUNTER — Telehealth: Payer: Self-pay | Admitting: *Deleted

## 2019-01-02 NOTE — Telephone Encounter (Signed)
Okay, she can use a muscle rub as needed as well.

## 2019-01-02 NOTE — Telephone Encounter (Signed)
Patient daughter notified and agreed.  

## 2019-01-02 NOTE — Telephone Encounter (Signed)
Daughter, [name] called and stated that patient had a flu shot over a month ago and complains that it is still sore. Daughter stated that she is a complainer. Stated that it was not red or warm to touch and not swollen. Daughter stated that she just wanted to let you know.

## 2019-02-02 ENCOUNTER — Ambulatory Visit
Admission: RE | Admit: 2019-02-02 | Discharge: 2019-02-02 | Disposition: A | Discharge status: Home / Self Care | Payer: Medicare Other | Source: Ambulatory Visit | Attending: Internal Medicine | Admitting: Internal Medicine

## 2019-02-02 ENCOUNTER — Other Ambulatory Visit: Payer: Self-pay

## 2019-02-02 ENCOUNTER — Encounter: Payer: Self-pay | Admitting: Internal Medicine

## 2019-02-02 ENCOUNTER — Ambulatory Visit (INDEPENDENT_AMBULATORY_CARE_PROVIDER_SITE_OTHER): Payer: Medicare Other | Admitting: Internal Medicine

## 2019-02-02 ENCOUNTER — Ambulatory Visit: Payer: Self-pay | Admitting: Internal Medicine

## 2019-02-02 ENCOUNTER — Other Ambulatory Visit: Payer: Self-pay | Admitting: *Deleted

## 2019-02-02 VITALS — BP 128/64 | HR 72 | Temp 97.3°F | Ht 60.0 in | Wt 147.0 lb

## 2019-02-02 DIAGNOSIS — R600 Localized edema: Secondary | ICD-10-CM | POA: Diagnosis not present

## 2019-02-02 DIAGNOSIS — E785 Hyperlipidemia, unspecified: Secondary | ICD-10-CM | POA: Diagnosis not present

## 2019-02-02 DIAGNOSIS — F0391 Unspecified dementia with behavioral disturbance: Secondary | ICD-10-CM | POA: Diagnosis not present

## 2019-02-02 DIAGNOSIS — I5032 Chronic diastolic (congestive) heart failure: Secondary | ICD-10-CM

## 2019-02-02 DIAGNOSIS — M79602 Pain in left arm: Secondary | ICD-10-CM

## 2019-02-02 DIAGNOSIS — R4189 Other symptoms and signs involving cognitive functions and awareness: Secondary | ICD-10-CM

## 2019-02-02 DIAGNOSIS — M79622 Pain in left upper arm: Secondary | ICD-10-CM | POA: Diagnosis not present

## 2019-02-02 DIAGNOSIS — E441 Mild protein-calorie malnutrition: Secondary | ICD-10-CM

## 2019-02-02 DIAGNOSIS — I10 Essential (primary) hypertension: Secondary | ICD-10-CM | POA: Diagnosis not present

## 2019-02-02 MED ORDER — FUROSEMIDE 40 MG PO TABS: ORAL_TABLET | ORAL | 1 refills | Status: DC

## 2019-02-02 MED ORDER — SIMVASTATIN 20 MG PO TABS: 20.0000 mg | ORAL_TABLET | Freq: Every day | ORAL | 1 refills | Status: DC

## 2019-02-02 MED ORDER — DONEPEZIL HCL 5 MG PO TABS: 5.0000 mg | ORAL_TABLET | Freq: Every day | ORAL | 1 refills | Status: DC

## 2019-02-02 MED ORDER — NAMENDA XR TITRATION PACK 7 & 14 & 21 &28 MG PO CP24: ORAL_CAPSULE | ORAL | 0 refills | Status: DC

## 2019-02-02 MED ORDER — BISOPROLOL FUMARATE 5 MG PO TABS: 5.0000 mg | ORAL_TABLET | Freq: Every day | ORAL | 1 refills | Status: DC

## 2019-02-02 MED ORDER — GABAPENTIN 100 MG PO CAPS: ORAL_CAPSULE | ORAL | 1 refills | Status: DC

## 2019-02-02 MED ORDER — AMLODIPINE BESY-BENAZEPRIL HCL 10-20 MG PO CAPS: 1.0000 | ORAL_CAPSULE | Freq: Every day | ORAL | 1 refills | Status: DC

## 2019-02-02 NOTE — Telephone Encounter (Signed)
Patient daughter requested refill to be sent to [facility] during OV.  Pended These due to [place]. Sent to Dr. [name] for approval.

## 2019-02-02 NOTE — Patient Instructions (Signed)
Please begin taking namenda XR daily.   This will be added to your pillbox.  It will help you maintain your independence in your home due to your memory loss.  PLEASE wear your life alert button.  This is a way to contact an the emergency personnel if you fall, there's a fire or something else dangerous in your home.  Please use your cane regularly.
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
## 2019-02-03 LAB — BRAIN NATRIURETIC PEPTIDE: Brain Natriuretic Peptide: 64 pg/mL (ref <100)

## 2019-02-03 LAB — BASIC METABOLIC PANEL
BUN/Creatinine Ratio: 33 (calc) — ABNORMAL HIGH (ref 6–22)
BUN: 38 mg/dL — ABNORMAL HIGH (ref 7–25)
CO2: 23 mmol/L (ref 20–32)
Calcium: 10.8 mg/dL — ABNORMAL HIGH (ref 8.6–10.4)
Chloride: 103 mmol/L (ref 98–110)
Creat: 1.16 mg/dL — ABNORMAL HIGH (ref 0.60–0.88)
Glucose, Bld: 133 mg/dL (ref 65–139)
Potassium: 4.3 mmol/L (ref 3.5–5.3)
Sodium: 141 mmol/L (ref 135–146)

## 2019-02-03 LAB — LIPID PANEL
Cholesterol: 221 mg/dL — ABNORMAL HIGH (ref <200)
HDL: 64 mg/dL (ref >=50)
LDL Cholesterol (Calc): 117 mg/dL (calc) — ABNORMAL HIGH
Non-HDL Cholesterol (Calc): 157 mg/dL (calc) — ABNORMAL HIGH (ref <130)
Total CHOL/HDL Ratio: 3.5 (calc) (ref <5.0)
Triglycerides: 300 mg/dL — ABNORMAL HIGH (ref <150)

## 2019-02-10 ENCOUNTER — Telehealth: Payer: Self-pay | Admitting: *Deleted

## 2019-02-10 NOTE — Telephone Encounter (Signed)
Daughter calling stating that the Namenda titration pack needs a prior auth, per daughter she is a Software engineer and can titrate her mothers meds if you wanted to just prescribe the 7mg  namenda, please advise (form in your folder)

## 2019-02-10 NOTE — Telephone Encounter (Signed)
Not sure what the form says, but I'm ok with [name] Morton titrating if we prescribe namenda XR 7mg  one daily for one week, 14mg  (2 capsules) daily for one week, 21mg  (3 capsules) daily for one week, 28 mg (4 capsules) daily for one week Dispense 70 of the 7mg  capsules.   I hope her mom will agree to take multiple capsules per day though.  I'm concerned about that.

## 2019-02-10 NOTE — Telephone Encounter (Signed)
Spoke with daughter and advised results. rx given verbally to pharmacy

## 2019-02-17 ENCOUNTER — Telehealth: Payer: Self-pay

## 2019-02-17 NOTE — Telephone Encounter (Signed)
Left detailed message on VM with results, advised daughter to call if any questions

## 2019-02-17 NOTE — Telephone Encounter (Signed)
Spoke with [name] Morton earlier and give her providers response. She verbalized understanding.

## 2019-02-17 NOTE — Telephone Encounter (Signed)
Of course I approve the prior authorization.  Pt has dementia and struggles with meds prescribed mutiple times per day so we are recommending the namenda XR instead of regular namenda.

## 2019-02-17 NOTE — Telephone Encounter (Signed)
Daughter [name] called back and stated she has tried with patient's insurance and is not able to get coverage. States patient will need a prior authorization and is fine doing that if Dr. [name] will approve prior authorization. Otherwise patient will not be able to get medication.    Please advise.

## 2019-02-17 NOTE — Addendum Note (Signed)
Addended by: Ruthell Rummage A on: 02/17/2019 11:29 AM   Modules accepted: Level of Service

## 2019-02-17 NOTE — Telephone Encounter (Signed)
Opened in error; Disregard.

## 2019-02-28 ENCOUNTER — Other Ambulatory Visit: Payer: Self-pay | Admitting: *Deleted

## 2019-03-07 ENCOUNTER — Telehealth: Payer: Self-pay | Admitting: *Deleted

## 2019-03-07 NOTE — Telephone Encounter (Signed)
[name] Morton, daughter called and stated that patient has been complaining for about 5 months now with her Left arm hurting from when she received the flu shot. Daughter is wanting to know if patient's shoulder has been x-rayed or any suggestions. Daughter doesn't think she needs a referral or anything but feels patient is going to bug them until something is done about this.  Please Advise.

## 2019-03-08 NOTE — Telephone Encounter (Signed)
It was xrayed after her last appt and there was nothing at the site.  Pt's other daughter who attended the visit was notified.

## 2019-03-09 NOTE — Telephone Encounter (Signed)
Patient daughter notified and agreed.  

## 2019-04-03 ENCOUNTER — Ambulatory Visit: Payer: Medicare Other | Admitting: Internal Medicine

## 2019-04-09 ENCOUNTER — Ambulatory Visit: Payer: Medicare Other | Attending: Internal Medicine

## 2019-04-09 DIAGNOSIS — Z23 Encounter for immunization: Secondary | ICD-10-CM | POA: Insufficient documentation

## 2019-04-09 NOTE — Progress Notes (Signed)
   Covid-19 Vaccination Clinic  Name:  [name] Morton    MRN: [id number] DOB: Feb 17, 1927  04/09/2019  Ms. [name] Morton was observed post Covid-19 immunization for 15 minutes without incidence. She was provided with Vaccine Information Sheet and instruction to access the V-Safe system.   Ms. [name] Morton was instructed to call 911 with any severe reactions post vaccine: [name] Kitchen Difficulty breathing  . Swelling of your face and throat  . A fast heartbeat  . A bad rash all over your body  . Dizziness and weakness    Immunizations Administered    Name Date Dose VIS Date Route   Pfizer COVID-19 Vaccine 04/09/2019 10:27 AM 0.3 mL 02/10/2019 Intramuscular   Manufacturer: [place]   Lot: [id number]   [place]: [id number]

## 2019-04-10 ENCOUNTER — Encounter: Payer: Self-pay | Admitting: Internal Medicine

## 2019-04-10 ENCOUNTER — Telehealth (INDEPENDENT_AMBULATORY_CARE_PROVIDER_SITE_OTHER): Payer: Medicare Other | Admitting: Internal Medicine

## 2019-04-10 ENCOUNTER — Other Ambulatory Visit: Payer: Self-pay

## 2019-04-10 ENCOUNTER — Ambulatory Visit: Payer: Medicare Other | Admitting: Nurse Practitioner

## 2019-04-10 VITALS — BP 137/74 | HR 55

## 2019-04-10 DIAGNOSIS — I10 Essential (primary) hypertension: Secondary | ICD-10-CM

## 2019-04-10 DIAGNOSIS — E441 Mild protein-calorie malnutrition: Secondary | ICD-10-CM | POA: Diagnosis not present

## 2019-04-10 DIAGNOSIS — G629 Polyneuropathy, unspecified: Secondary | ICD-10-CM

## 2019-04-10 DIAGNOSIS — I5032 Chronic diastolic (congestive) heart failure: Secondary | ICD-10-CM

## 2019-04-10 DIAGNOSIS — E785 Hyperlipidemia, unspecified: Secondary | ICD-10-CM | POA: Diagnosis not present

## 2019-04-10 DIAGNOSIS — F0391 Unspecified dementia with behavioral disturbance: Secondary | ICD-10-CM | POA: Diagnosis not present

## 2019-04-10 DIAGNOSIS — F03918 Unspecified dementia, unspecified severity, with other behavioral disturbance: Secondary | ICD-10-CM | POA: Insufficient documentation

## 2019-04-10 MED ORDER — BISOPROLOL FUMARATE 5 MG PO TABS: 5.0000 mg | ORAL_TABLET | Freq: Every day | ORAL | 3 refills | Status: DC

## 2019-04-10 MED ORDER — GABAPENTIN 100 MG PO CAPS: ORAL_CAPSULE | ORAL | 3 refills | Status: DC

## 2019-04-10 MED ORDER — DONEPEZIL HCL 5 MG PO TABS: 5.0000 mg | ORAL_TABLET | Freq: Every day | ORAL | 3 refills | Status: DC

## 2019-04-10 MED ORDER — SIMVASTATIN 20 MG PO TABS: 20.0000 mg | ORAL_TABLET | Freq: Every day | ORAL | 3 refills | Status: DC

## 2019-04-10 MED ORDER — AMLODIPINE BESY-BENAZEPRIL HCL 10-20 MG PO CAPS: 1.0000 | ORAL_CAPSULE | Freq: Every day | ORAL | 3 refills | Status: DC

## 2019-04-10 NOTE — Progress Notes (Signed)
Patient ID: [name] Morton, female   DOB: [birth year], 86 y.o.   MRN: [id number] This service is provided via telemedicine--mychart video visit  No vital signs collected/recorded due to the encounter was a telemedicine visit.   Location of patient (ex: home, work):  Home   Patient consents to a telephone visit:yes   Location of the provider (ex: office, home):  [facility]  Name of any referring provider: Dr. [name]  Names of all persons participating in the telemedicine service and their role in the encounter: Dr. [name] DO, Charlene CMA, Cheyl (daughter) and Patient Time spent on call:  10 min with CMA  Virtual Visit via Video Note  I connected with [name] Morton on 04/10/19 at  2:45 PM EST by a video enabled telemedicine application and verified that I am speaking with the correct person using two identifiers.  Location: Patient: Home Provider: [facility] clinic   I discussed the limitations of evaluation and management by telemedicine and the availability of in person appointments. The patient expressed understanding and agreed to proceed.  History of Present Illness: 86 yo female.  Says she is doing fine, no problems.  They were never able to get the namenda XR on her medication.  She has been taking her same meds.  She's not fallen anymore since last time.  She had some dizziness a week or so ago.  BP has been fine.  She is using her cane regularly.  Reminded her to wear her life alert button.  No pain right now.  Does not sleep at night and says she never did.  Appetite--does not have one, but does eat a little at times when she actually feels hunger.  Intake small--not changed.  It's about the same.  Her HR runs low so I am hesitant to increase aricept.    Observations/Objective: Awake and alert HOH Was looking around for glasses and did find them so she could see me but was unable to hear me through computer--[name] was repeating everything for her  Assessment and Plan: 1. Dementia with  behavioral disturbance, unspecified dementia type ([place]) - tried to get her on namenda XR, but insurance would not cover it for her even when we tried to give her 7mg  pills and had her take multiple  -not a candidate for more aricept due to her bradycardia - CBC with Differential/Platelet; Future - COMPLETE METABOLIC PANEL WITH GFR; Future - Lipid panel; Future - TSH; Future - donepezil (ARICEPT) 5 MG tablet; Take 1 tablet (5 mg total) by mouth at bedtime. For memory loss  Dispense: 90 tablet; Refill: 3  2. Essential hypertension -bp at goal with current therapy, cont same regimen and monitor - COMPLETE METABOLIC PANEL WITH GFR; Future - bisoprolol (ZEBETA) 5 MG tablet; Take 1 tablet (5 mg total) by mouth daily.  Dispense: 90 tablet; Refill: 3  3. Diastolic dysfunction with chronic heart failure (HCC) - no signs of acute exacerbation, cont same regimen - COMPLETE METABOLIC PANEL WITH GFR; Future - amLODipine-benazepril (LOTREL) 10-20 MG capsule; Take 1 capsule by mouth daily.  Dispense: 90 capsule; Refill: 3  4. Mild protein-calorie malnutrition ([facility]) - food choices remain poor and she does not have a great appetite  5. Neuropathy - stable, no pain complaints with current regimen - TSH; Future - gabapentin (NEURONTIN) 100 MG capsule; Take 3 capsules every night  Dispense: 270 capsule; Refill: 3  6. Dyslipidemia, goal LDL below 100 - cont same regimen due to some vascular component to dementia - Lipid  panel; Future - simvastatin (ZOCOR) 20 MG tablet; Take 1 tablet (20 mg total) by mouth at bedtime.  Dispense: 90 tablet; Refill: 3  Follow Up Instructions:   I discussed the assessment and treatment plan with the patient. The patient was provided an opportunity to ask questions and all were answered. The patient agreed with the plan and demonstrated an understanding of the instructions.   The patient was advised to call back or seek an in-person evaluation if the symptoms worsen or if  the condition fails to improve as anticipated.  I provided 30 minutes of non-face-to-face time during this encounter.  Shae Augello L. Cassondra Stachowski, D.O. [facility] [address], [place] [address] Cell Phone (Mon-Fri 8am-5pm):  [phone] On Call:  [phone] & follow prompts after 5pm & weekends Office Phone:  [phone] Office Fax:  [phone]

## 2019-05-03 ENCOUNTER — Ambulatory Visit: Payer: Medicare Other

## 2019-05-03 ENCOUNTER — Ambulatory Visit: Payer: Medicare Other | Attending: Internal Medicine

## 2019-05-03 DIAGNOSIS — Z23 Encounter for immunization: Secondary | ICD-10-CM | POA: Insufficient documentation

## 2019-05-03 NOTE — Progress Notes (Signed)
   Covid-19 Vaccination Clinic  Name:  [name] Morton    MRN: [id number] DOB: 1926-04-09  05/03/2019  Ms. [name] Morton was observed post Covid-19 immunization for 15 minutes without incident. She was provided with Vaccine Information Sheet and instruction to access the V-Safe system.   Ms. [name] Morton was instructed to call 911 with any severe reactions post vaccine: [name] Kitchen Difficulty breathing  . Swelling of face and throat  . A fast heartbeat  . A bad rash all over body  . Dizziness and weakness   Immunizations Administered    Name Date Dose VIS Date Route   Pfizer COVID-19 Vaccine 05/03/2019 10:20 AM 0.3 mL 02/10/2019 Intramuscular   Manufacturer: [place]   Lot: [id number]   [place]: [id number]

## 2019-06-08 ENCOUNTER — Ambulatory Visit: Payer: Medicare Other | Admitting: Neurology

## 2019-06-28 ENCOUNTER — Telehealth: Payer: Self-pay

## 2019-06-28 NOTE — Telephone Encounter (Signed)
Patients daughter [name] called to ask what if anything can her mother take over the counter  because her mother has been complaining of head pain but she says it's not a headache and stuffiness and the only thing that seems to help a little is for her to lye down she doesn't want to give her something that might interact with her current medications Please Advise

## 2019-06-28 NOTE — Telephone Encounter (Signed)
She can try her on zyrtec 10mg  daily and see if that helps the headache and congestion.  Another option would be flonase or nasonex nasal spray.

## 2019-06-29 NOTE — Telephone Encounter (Signed)
I called patients daughter [name] Morton and informed her about what Dr. [name] recommended  For her mother to take for her head pain and congestion

## 2019-07-18 ENCOUNTER — Ambulatory Visit (INDEPENDENT_AMBULATORY_CARE_PROVIDER_SITE_OTHER): Payer: Medicare Other | Admitting: Family

## 2019-07-18 ENCOUNTER — Encounter: Payer: Self-pay | Admitting: Family

## 2019-07-18 ENCOUNTER — Other Ambulatory Visit: Payer: Self-pay

## 2019-07-18 VITALS — BP 126/60 | HR 60 | Temp 97.1°F | Resp 16 | Ht 60.0 in | Wt 136.0 lb

## 2019-07-18 DIAGNOSIS — G8929 Other chronic pain: Secondary | ICD-10-CM | POA: Diagnosis not present

## 2019-07-18 DIAGNOSIS — H9193 Unspecified hearing loss, bilateral: Secondary | ICD-10-CM | POA: Diagnosis not present

## 2019-07-18 DIAGNOSIS — M25512 Pain in left shoulder: Secondary | ICD-10-CM

## 2019-07-18 DIAGNOSIS — Z780 Asymptomatic menopausal state: Secondary | ICD-10-CM | POA: Diagnosis not present

## 2019-07-18 MED ORDER — DICLOFENAC SODIUM 1 % EX GEL: 2.0000 g | Freq: Four times a day (QID) | CUTANEOUS | 3 refills | Status: DC

## 2019-07-18 NOTE — Patient Instructions (Signed)
-   Apply voltaren gel every 6 hours as needed for pain - Please get left shoulder X-ray done at [facility] [place] will call you with results.

## 2019-07-18 NOTE — Progress Notes (Signed)
Provider: Dinah Ngetich FNP-C  Gayland Curry, DO  Patient Care Team: Gayland Curry, DO as PCP - General (Geriatric Medicine) Wonda Horner, MD as Consulting Physician (Gastroenterology) Gaynelle Arabian, MD as Consulting Physician (Orthopedic Surgery) Leonie Man, MD as Consulting Physician (Cardiology) Shon Hough, MD as Consulting Physician (Ophthalmology)  Extended Emergency Contact Information Primary Emergency Contact: [name] of [place] Phone: [phone] Mobile Phone: [phone] Relation: Daughter Secondary Emergency Contact: [name],[name]          Raelene Bott of [place] Phone: [phone] Mobile Phone: [phone] Relation: Daughter  Code Status: Full Code  Goals of care: Advanced Directive information Advanced Directives 07/18/2019  Does Patient Have a Medical Advance Directive? Yes  Type of Advance Directive [place]  Does patient want to make changes to medical advance directive? No - Patient declined  Copy of [place] in Chart? Yes - validated most recent copy scanned in chart (See row information)  Would patient like information on creating a medical advance directive? -  Pre-existing out of facility DNR order (yellow form or pink MOST form) -     Chief Complaint  Patient presents with  . Acute Visit    Left Shoulder Pain    HPI:  Pt is a 86 y.o. female seen today for an acute visit for evaluation of left shoulder pain which has worsen in the past several weeks.she is hard of hearing daughter assist with HPI information during visit.she describes pain on shoulder as intermittent.sometimes she doesn't have pain.Pain worst with putting on clothes.states fell on shoulder on stairs in December,2020.X-ray done showed mild degenerative changes but no fracture or dislocation.She denies any Shoulder stiffness,weakness,swelling or crepitus.she took ibuprofen 2  tablets yesterday with some relief but pain did not go away.Use of ibuprofen discussed due to increase GI bleed due to patient's advance age both verbalized understanding.discussed use of tylenol instead.Daughter states patient tends to stay in the bed most of the time a leans on her left shoulder wonders whether if this is worsening her pain.   Past Medical History:  Diagnosis Date  . Breast cancer ([place]) 1997   right - tx with lumpectomty & radiation  . Burning sensation of feet    Per New Patient Packet-[facility]   . Chronic anxiety   . Chronic kidney disease    Per New Patient Packet-PSC   . Cognitive impairment    Per New Patient Packet-PSC   . Diverticulosis   . Dyslipidemia   . Fluid retention    Per New Patient Packet-[facility]   . Gait difficulty   . GERD (gastroesophageal reflux disease)    RARE - NO MEDICATIONS  . Hearing impairment    BILATERAL HEARING AIDS  . Heart murmur    Per New Patient Packet-PSC   . High cholesterol    Per New Patient Packet-PSC   . History of nuclear stress test 11/2009   dipyridamole; no evidence of ischemia, normal, low risk   . History of shingles 1998   NO RESIDUAL PROBLEMS  . Hypertension    SEES CARDIOLOGIST DR. DAVID HARDING FOR HYPERTENSION   . Insomnia   . Lymphedema of arm    RIGHT   . Mitral and aortic regurgitation   . OA (osteoarthritis)    OA AND PAIN LEFT KNEE  . Osteopenia   . Scoliosis    Per New Patient Packet-PSC   . Shortness of breath    WHEN CLIMBING  STEPS AT MY HOME--OTHERWISE OK  . Sinus problem   . Sleep difficulties    ALWAYS HAD SLEEPING PROBLEMS - MEDICATION HELPS PT SLEEP ABOUT 3 HOURS  . SOB (shortness of breath)    Per New Patient Packet-PSC, hospitalized 2018    Past Surgical History:  Procedure Laterality Date  . ABDOMINAL HYSTERECTOMY  1973  . APPENDECTOMY  1951  . BREAST ADENOMA  1950   EXCISED  . BREAST LUMPECTOMY Right 1997  . COLONOSCOPY  2014   Dr.Sam Penelope Coop, Per New Patient Packet-PSC   . EYE  SURGERY     BILATERAL CATARACT EXTRACTIONS AND LENS IMPLANTS  . NM MYOVIEW LTD  08/2016   Hyperdynamic LV function EF 65%. No ischemia or infarction. LOW RISK  . REPLACEMENT TOTAL KNEE  2002  . ROTATOR CUFF REPAIR Right 12/2009   Dr. Lenna Sciara. [place]   . TOTAL KNEE ARTHROPLASTY Left 05/01/2013   Procedure: LEFT TOTAL KNEE ARTHROPLASTY;  Surgeon: Gearlean Alf, MD;  Location: [facility];  Service: Orthopedics;  Laterality: Left;  . TRANSTHORACIC ECHOCARDIOGRAM  05/1994   trace mitral/tricuspic/aortic inusff; hyperkinetic LV  . TRANSTHORACIC ECHOCARDIOGRAM  08/2016   Normal LV size. EF 65-70%. GR 1 DD. (Normal for age). Mild aortic stenosis    Allergies  Allergen Reactions  . Benadryl [Diphenhydramine Hcl (Sleep)]     Makes me hyper   . Cardizem [Diltiazem Hcl]     Doesn't remember. Dr Rex Kras told patient not to take.   [name] Kitchen Ceftin [Cefuroxime Axetil] Other (See Comments)    Stomach Issues   . Epinephrine Other (See Comments)    unknown  . Oxycodone Hcl     Loss of appetite: sores on tongue and mouth  . Tetanus Toxoids Other (See Comments)    Unknown   . Tramadol Hcl     Loss of appetite: dry mouth/sores on tongue and mouth  . Vistaril [Hydroxyzine] Other (See Comments)    unknown  . Vytorin [Ezetimibe-Simvastatin]     Not comfortable taking it. Doesn't remember reaction.     Outpatient Encounter Medications as of 07/18/2019  Medication Sig  . acetaminophen (TYLENOL) 650 MG CR tablet Take 1,300 mg by mouth 2 (two) times daily.   [name] Kitchen amLODipine-benazepril (LOTREL) 10-20 MG capsule Take 1 capsule by mouth daily.  . bisoprolol (ZEBETA) 5 MG tablet Take 1 tablet (5 mg total) by mouth daily.  [name] Kitchen donepezil (ARICEPT) 5 MG tablet Take 1 tablet (5 mg total) by mouth at bedtime. For memory loss  . furosemide (LASIX) 40 MG tablet TAKE 40 MG BY MOUTH DAILY AND MAY TAKE AN EXTRA 20 MG ( 1/2 TABLET) IF NEEDED FOR PUFFINESS  . gabapentin (NEURONTIN) 100 MG capsule Take 3 capsules every night  . Liniments  (SALONPAS EX) Apply 1 application topically as needed (back pain).   . Melatonin 5 MG CAPS Take 5 mg by mouth daily after supper.  . Menthol, Topical Analgesic, (BIOFREEZE COLORLESS) 4 % GEL Apply 1 application topically 3 (three) times daily. Massage into right arm three times a day for pain.  . simvastatin (ZOCOR) 20 MG tablet Take 1 tablet (20 mg total) by mouth at bedtime.  . diclofenac Sodium (VOLTAREN) 1 % GEL Apply 2 g topically 4 (four) times daily. Apply to left shoulder for pain   No facility-administered encounter medications on file as of 07/18/2019.    Review of Systems Review of Systems  Constitutional: Negative for chills, fever, malaise/fatigue and weight loss.  HENT: Positive for hearing loss.  Negative for congestion, ear discharge, ear pain, sinus pain and sore throat.   Eyes: Negative for blurred vision, pain, discharge and redness.  Respiratory: Negative for cough, shortness of breath and wheezing.   Cardiovascular: Negative for chest pain, palpitations and leg swelling.  Gastrointestinal: Negative for abdominal pain, constipation, diarrhea, melena, nausea and vomiting.  Genitourinary: Negative for dysuria, flank pain, frequency and urgency.  Musculoskeletal: Positive for joint pain. Negative for falls and myalgias.       Left shoulder pain and chronic lower back pain   Skin: Negative for itching and rash.  Neurological: Negative for dizziness, sensory change, speech change, weakness and headaches.  Psychiatric/Behavioral: Negative for depression and suicidal ideas. The patient is not nervous/anxious and does not have insomnia.     Immunization History  Administered Date(s) Administered  . Fluad Quad(high Dose 65+) 10/31/2018  . Influenza, High Dose Seasonal PF 12/23/2015, 11/29/2017  . Influenza,inj,quad, With Preservative 12/31/2016  . Influenza-Unspecified 11/30/2016  . PFIZER SARS-COV-2 Vaccination 04/09/2019, 05/03/2019  . Pneumococcal Conjugate-13 07/06/2013   . Pneumococcal Polysaccharide-23 05/05/2004  . Tetanus 01/27/2002  . Zoster 09/16/2007   Pertinent  Health Maintenance Due  Topic Date Due  . DEXA SCAN  Never done  . INFLUENZA VACCINE  10/01/2019  . PNA vac Low Risk Adult  Completed   Fall Risk  07/18/2019 04/10/2019 02/03/2019 02/02/2019 04/25/2018  Falls in the past year? 1 1 - 1 0  Number falls in past yr: 1 1 - 1 0  Injury with Fall? 1 0 - 0 0  Comment Daughter states mom had blacked out and fell down. Patient daughter also states mom gets bruises from falls. - - - -  Risk for fall due to : - - History of fall(s);Impaired balance/gait;Mental status change;Medication side effect History of fall(s);Impaired balance/gait -  Follow up - - Falls evaluation completed;Education provided;Falls prevention discussed - -    Vitals:   07/18/19 1323  BP: 126/60  Pulse: 60  Resp: 16  Temp: (!) 97.1 F (36.2 C)  SpO2: 96%  Weight: 136 lb (61.7 kg)  Height: 5' (1.524 m)   Body mass index is 26.56 kg/m. Physical Exam  Physical Exam Vitals reviewed.  Constitutional:      General: She is not in acute distress.    Appearance: She is overweight. She is not ill-appearing.  HENT:     Head: Normocephalic.  Eyes:     General: No scleral icterus.       Right eye: No discharge.        Left eye: No discharge.     Extraocular Movements: Extraocular movements intact.     Conjunctiva/sclera: Conjunctivae normal.     Pupils: Pupils are equal, round, and reactive to light.  Cardiovascular:     Rate and Rhythm: Normal rate and regular rhythm.     Pulses: Normal pulses.     Heart sounds: Normal heart sounds. No murmur. No friction rub. No gallop.   Pulmonary:     Effort: Pulmonary effort is normal. No respiratory distress.     Breath sounds: Normal breath sounds. No wheezing, rhonchi or rales.  Chest:     Chest wall: No tenderness.  Abdominal:     General: Bowel sounds are normal. There is no distension.     Palpations: Abdomen is soft. There  is no mass.     Tenderness: There is no abdominal tenderness. There is no right CVA tenderness, left CVA tenderness, guarding or rebound.  Musculoskeletal:  General: No swelling.     Right shoulder: Normal.     Left shoulder: Tenderness present. No swelling, deformity, effusion or crepitus. Decreased range of motion. Normal strength. Normal pulse.     Right lower leg: No edema.     Left lower leg: No edema.     Comments: Unsteady gait ambulates with Rolator.  Skin:    General: Skin is warm.     Coloration: Skin is not pale.     Findings: No bruising, erythema or rash.  Neurological:     Mental Status: She is alert and oriented to person, place, and time.     Cranial Nerves: No cranial nerve deficit.     Sensory: No sensory deficit.     Motor: No weakness.     Gait: Gait abnormal.  Psychiatric:        Mood and Affect: Mood normal.        Behavior: Behavior normal.        Thought Content: Thought content normal.        Judgment: Judgment normal.    Labs reviewed: Recent Labs    10/31/18 1338 02/02/19 1207  NA 140 141  K 4.4 4.3  CL 100 103  CO2 33* 23  GLUCOSE 132* 133  BUN 27* 38*  CREATININE 1.12* 1.16*  CALCIUM 10.8* 10.8*   Recent Labs    10/31/18 1338  AST 14  ALT 12  BILITOT 0.3  PROT 7.4   Recent Labs    10/31/18 1338  WBC 6.6  NEUTROABS 3,465  HGB 12.7  HCT 37.8  MCV 90.0  PLT 229   Lab Results  Component Value Date   TSH 2.49 11/10/2017   No results found for: HGBA1C Lab Results  Component Value Date   CHOL 221 (H) 02/02/2019   HDL 64 02/02/2019   LDLCALC 117 (H) 02/02/2019   TRIG 300 (H) 02/02/2019   CHOLHDL 3.5 02/02/2019    Significant Diagnostic Results in last 30 days:  No results found.  Assessment/Plan 1. Chronic left shoulder pain Limited ROM with placing hand behind her back no limitation with extension.AC joint tenderness.Previous X-ray reviewed. - Advised to continue on Tylenol as needed.will add Voltaren gel as  below and obtain imaging to re-evaluate.discussed option to refer to Orthopedic for cortisol injection but has declined today.Has had cortisol injection on the back that was not successful.  - diclofenac Sodium (VOLTAREN) 1 % GEL; Apply 2 g topically 4 (four) times daily. Apply to left shoulder for pain  Dispense: 100 g; Refill: 3 - DG Shoulder Left; Future advised to get X-ray at [facility] [place] [facility] [place] imaging daughter states will get imaging tomorrow morning has a Zoom meeting for a care giver after this visit.   2. Bilateral hearing loss, unspecified hearing loss type Worsening hearing.Made an appointment with Dr.Heather Langston Masker at [facility] but was told need a referral prior to appointment.   - Ambulatory referral to Audiology  Family/ staff Communication: Reviewed plan of care with patient and daughter verbalized understanding.   Labs/tests ordered: - DG Shoulder Left; Future  Next Appointment: as needed if symptoms worsen or fail to improve.   Sandrea Hughs, NP

## 2019-07-19 NOTE — Addendum Note (Signed)
Addended byMarlowe Sax C on: 07/19/2019 10:38 AM   Modules accepted: Orders

## 2019-07-21 ENCOUNTER — Telehealth: Payer: Self-pay | Admitting: *Deleted

## 2019-07-21 NOTE — Telephone Encounter (Signed)
Patient daughter, [name] called and stated that patient is rewriting her will and the lawyer needs a letter from Provider stating that patient is competent of signing the legal document.  Please Advise.   (daughter wants to wait for Dr. [name] to address.)

## 2019-07-24 NOTE — Telephone Encounter (Signed)
[facility] Notified.  Requesting to speak with Dr. [name] regarding this. Would like Dr. [name] to call her at [phone]

## 2019-07-24 NOTE — Telephone Encounter (Signed)
Appt is needed

## 2019-07-24 NOTE — Telephone Encounter (Signed)
I'm not so sure she actually can still make such decisions independently.  I have not seen her in quite a while and much of her judgment was impaired back when I did see her.

## 2019-07-25 NOTE — Telephone Encounter (Signed)
Patient daughter, [name] notified and agreed. Will call back for an appointment.

## 2019-08-01 DIAGNOSIS — H903 Sensorineural hearing loss, bilateral: Secondary | ICD-10-CM | POA: Diagnosis not present

## 2019-08-01 DIAGNOSIS — J300 Vasomotor rhinitis: Secondary | ICD-10-CM | POA: Diagnosis not present

## 2019-08-21 ENCOUNTER — Telehealth: Payer: Self-pay | Admitting: *Deleted

## 2019-08-21 NOTE — Telephone Encounter (Signed)
Daughter called and stated that patient received a [employer]. Daughter stated that she sent response back to the court stating that patient is 68 with Dementia for exemption.   The court sent letter back to patient stating that they have to have a written note from patient's PCP.   I instructed daughter to bring in [place] duty letter and the court letter and we would give it to the provider to review and write. Agreed.

## 2019-08-22 NOTE — Telephone Encounter (Signed)
Once we received the jury duty letter, we can compose the letter including the juror number, etc excusing her from jury duty permanently due to her advanced age and cognitive decline.  Any provider here can sign it since I won't be back in the office until July 1st.  Thanks.

## 2019-08-23 ENCOUNTER — Encounter: Payer: Self-pay | Admitting: *Deleted

## 2019-08-23 NOTE — Telephone Encounter (Signed)
Received [employer] letter and letter from [employer].  Letter typed and printed and placed in [place] folder to review and sign. (to return to me once signed)  Daughter, Lowell Guitar, dropped off [address] [place], [place] [address] [phone]

## 2019-09-13 DIAGNOSIS — H903 Sensorineural hearing loss, bilateral: Secondary | ICD-10-CM | POA: Diagnosis not present

## 2019-09-16 ENCOUNTER — Encounter: Payer: Self-pay | Admitting: Internal Medicine

## 2019-10-02 ENCOUNTER — Telehealth: Payer: Self-pay | Admitting: *Deleted

## 2019-10-02 NOTE — Telephone Encounter (Signed)
Patient daughter, [name] Morton called and stated that patient has an appointment on 10/19/2019 but just wanted to let you know that there is a chance she won't come. Stated that patient's other daughter, Coralyn Mark has cut her off from her mother and decided she is to have NO contact with her.  FYI

## 2019-10-06 ENCOUNTER — Other Ambulatory Visit: Payer: Medicare Other

## 2019-10-09 ENCOUNTER — Telehealth: Payer: Self-pay | Admitting: Internal Medicine

## 2019-10-09 ENCOUNTER — Ambulatory Visit: Payer: Medicare Other | Admitting: Internal Medicine

## 2019-10-09 ENCOUNTER — Other Ambulatory Visit: Payer: Self-pay

## 2019-10-09 DIAGNOSIS — G629 Polyneuropathy, unspecified: Secondary | ICD-10-CM

## 2019-10-09 DIAGNOSIS — E441 Mild protein-calorie malnutrition: Secondary | ICD-10-CM

## 2019-10-09 DIAGNOSIS — F0391 Unspecified dementia with behavioral disturbance: Secondary | ICD-10-CM

## 2019-10-09 DIAGNOSIS — I10 Essential (primary) hypertension: Secondary | ICD-10-CM

## 2019-10-09 DIAGNOSIS — E785 Hyperlipidemia, unspecified: Secondary | ICD-10-CM

## 2019-10-09 DIAGNOSIS — I5032 Chronic diastolic (congestive) heart failure: Secondary | ICD-10-CM

## 2019-10-09 NOTE — Telephone Encounter (Signed)
[name] called me on call.  Identified herself as POA for patient and said that Friday pt felt weak, swimmyheaded, and off balance.  [name] took her home with her b/c she didn't think she could stay alone.  She had not checked VS--recommended check BP and HR since had capability and if abnormal to call me back.  If not, call Monday for appt at [facility].

## 2019-10-12 ENCOUNTER — Encounter: Payer: Self-pay | Admitting: Internal Medicine

## 2019-10-12 ENCOUNTER — Other Ambulatory Visit: Payer: Self-pay

## 2019-10-12 ENCOUNTER — Ambulatory Visit (INDEPENDENT_AMBULATORY_CARE_PROVIDER_SITE_OTHER): Payer: Medicare Other | Admitting: Internal Medicine

## 2019-10-12 VITALS — BP 122/62 | HR 59 | Temp 97.5°F | Ht 60.0 in | Wt 144.0 lb

## 2019-10-12 DIAGNOSIS — H903 Sensorineural hearing loss, bilateral: Secondary | ICD-10-CM | POA: Diagnosis not present

## 2019-10-12 DIAGNOSIS — F0390 Unspecified dementia without behavioral disturbance: Secondary | ICD-10-CM | POA: Diagnosis not present

## 2019-10-12 DIAGNOSIS — M6281 Muscle weakness (generalized): Secondary | ICD-10-CM | POA: Insufficient documentation

## 2019-10-12 DIAGNOSIS — I10 Essential (primary) hypertension: Secondary | ICD-10-CM

## 2019-10-12 DIAGNOSIS — E441 Mild protein-calorie malnutrition: Secondary | ICD-10-CM | POA: Diagnosis not present

## 2019-10-12 DIAGNOSIS — M2669 Other specified disorders of temporomandibular joint: Secondary | ICD-10-CM | POA: Diagnosis not present

## 2019-10-12 DIAGNOSIS — E785 Hyperlipidemia, unspecified: Secondary | ICD-10-CM | POA: Diagnosis not present

## 2019-10-12 DIAGNOSIS — I890 Lymphedema, not elsewhere classified: Secondary | ICD-10-CM | POA: Diagnosis not present

## 2019-10-12 DIAGNOSIS — H9202 Otalgia, left ear: Secondary | ICD-10-CM | POA: Diagnosis not present

## 2019-10-12 DIAGNOSIS — F03A0 Unspecified dementia, mild, without behavioral disturbance, psychotic disturbance, mood disturbance, and anxiety: Secondary | ICD-10-CM

## 2019-10-12 DIAGNOSIS — F0391 Unspecified dementia with behavioral disturbance: Secondary | ICD-10-CM

## 2019-10-12 MED ORDER — MIRTAZAPINE 7.5 MG PO TABS
7.5000 mg | ORAL_TABLET | Freq: Every day | ORAL | 3 refills | Status: DC
Start: 2019-10-12 — End: 2019-12-28
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
## 2019-10-12 NOTE — Patient Instructions (Addendum)
Do not drink caffeine after 6pm at night.  Use tylenol maximum of 3000mg  per 24 hours for headache.  Has gabapentin therapy also intended to help this and her neuropathy in her feet.  I have put in a referral to home health physical therapy and occupational therapy.  Resume mirtazapine to help with sleep and mood.

## 2019-10-13 ENCOUNTER — Ambulatory Visit: Payer: Medicare Other | Admitting: Internal Medicine

## 2019-10-13 NOTE — Progress Notes (Signed)
Thyroid is in normal range Cholesterol is pretty good especially for a 86 year old. Kidney function is stable Calcium is a little high, but her nutrition has not been good so it could be affected by that.  Please confirm that she is NOT taking any calcium (not on list, but sometimes we are not told) She is a bit anemic which is new.  It may be dietary.  I thought she appeared pale.  Please add on iron panel (parts that we can do) and B12 to her labs.  [name] Morton should keep an eye on whether her stools are dark or have blood in them.

## 2019-10-14 ENCOUNTER — Other Ambulatory Visit: Payer: Self-pay | Admitting: Internal Medicine

## 2019-10-14 DIAGNOSIS — F329 Major depressive disorder, single episode, unspecified: Secondary | ICD-10-CM | POA: Diagnosis not present

## 2019-10-14 DIAGNOSIS — K219 Gastro-esophageal reflux disease without esophagitis: Secondary | ICD-10-CM | POA: Diagnosis not present

## 2019-10-14 DIAGNOSIS — N189 Chronic kidney disease, unspecified: Secondary | ICD-10-CM | POA: Diagnosis not present

## 2019-10-14 DIAGNOSIS — E785 Hyperlipidemia, unspecified: Secondary | ICD-10-CM | POA: Diagnosis not present

## 2019-10-14 DIAGNOSIS — M419 Scoliosis, unspecified: Secondary | ICD-10-CM

## 2019-10-14 DIAGNOSIS — G4700 Insomnia, unspecified: Secondary | ICD-10-CM | POA: Diagnosis not present

## 2019-10-14 DIAGNOSIS — M1990 Unspecified osteoarthritis, unspecified site: Secondary | ICD-10-CM | POA: Diagnosis not present

## 2019-10-14 DIAGNOSIS — M8580 Other specified disorders of bone density and structure, unspecified site: Secondary | ICD-10-CM

## 2019-10-14 DIAGNOSIS — I129 Hypertensive chronic kidney disease with stage 1 through stage 4 chronic kidney disease, or unspecified chronic kidney disease: Secondary | ICD-10-CM | POA: Diagnosis not present

## 2019-10-14 DIAGNOSIS — F419 Anxiety disorder, unspecified: Secondary | ICD-10-CM | POA: Diagnosis not present

## 2019-10-14 DIAGNOSIS — Z853 Personal history of malignant neoplasm of breast: Secondary | ICD-10-CM | POA: Diagnosis not present

## 2019-10-14 DIAGNOSIS — F0391 Unspecified dementia with behavioral disturbance: Secondary | ICD-10-CM | POA: Diagnosis not present

## 2019-10-14 DIAGNOSIS — I890 Lymphedema, not elsewhere classified: Secondary | ICD-10-CM | POA: Diagnosis not present

## 2019-10-14 DIAGNOSIS — M6281 Muscle weakness (generalized): Secondary | ICD-10-CM | POA: Diagnosis not present

## 2019-10-14 LAB — VITAMIN B12: Vitamin B-12: 593 pg/mL (ref 200–1100)

## 2019-10-14 LAB — COMPLETE METABOLIC PANEL WITH GFR
AG Ratio: 1.7 (calc) (ref 1.0–2.5)
ALT: 9 U/L (ref 6–29)
AST: 12 U/L (ref 10–35)
Albumin: 4.5 g/dL (ref 3.6–5.1)
Alkaline phosphatase (APISO): 55 U/L (ref 37–153)
BUN/Creatinine Ratio: 17 (calc) (ref 6–22)
BUN: 19 mg/dL (ref 7–25)
CO2: 30 mmol/L (ref 20–32)
Calcium: 10.6 mg/dL — ABNORMAL HIGH (ref 8.6–10.4)
Chloride: 103 mmol/L (ref 98–110)
Creat: 1.14 mg/dL — ABNORMAL HIGH (ref 0.60–0.88)
GFR, Est African American: 49 mL/min/{1.73_m2} — ABNORMAL LOW (ref >=60)
GFR, Est Non African American: 42 mL/min/{1.73_m2} — ABNORMAL LOW (ref >=60)
Globulin: 2.6 g/dL (calc) (ref 1.9–3.7)
Glucose, Bld: 124 mg/dL — ABNORMAL HIGH (ref 65–99)
Potassium: 4.5 mmol/L (ref 3.5–5.3)
Sodium: 142 mmol/L (ref 135–146)
Total Bilirubin: 0.3 mg/dL (ref 0.2–1.2)
Total Protein: 7.1 g/dL (ref 6.1–8.1)

## 2019-10-14 LAB — CBC WITH DIFFERENTIAL/PLATELET
Absolute Monocytes: 422 cells/uL (ref 200–950)
Basophils Absolute: 31 cells/uL (ref 0–200)
Basophils Relative: 0.5 %
Eosinophils Absolute: 130 cells/uL (ref 15–500)
Eosinophils Relative: 2.1 %
HCT: 34.9 % — ABNORMAL LOW (ref 35.0–45.0)
Hemoglobin: 11.3 g/dL — ABNORMAL LOW (ref 11.7–15.5)
Lymphs Abs: 2300 cells/uL (ref 850–3900)
MCH: 28.5 pg (ref 27.0–33.0)
MCHC: 32.4 g/dL (ref 32.0–36.0)
MCV: 88.1 fL (ref 80.0–100.0)
MPV: 11.1 fL (ref 7.5–12.5)
Monocytes Relative: 6.8 %
Neutro Abs: 3317 cells/uL (ref 1500–7800)
Neutrophils Relative %: 53.5 %
Platelets: 207 10*3/uL (ref 140–400)
RBC: 3.96 10*6/uL (ref 3.80–5.10)
RDW: 14.1 % (ref 11.0–15.0)
Total Lymphocyte: 37.1 %
WBC: 6.2 10*3/uL (ref 3.8–10.8)

## 2019-10-14 LAB — IRON,TIBC AND FERRITIN PANEL
%SAT: 10 % (calc) — ABNORMAL LOW (ref 16–45)
Ferritin: 6 ng/mL — ABNORMAL LOW (ref 16–288)
Iron: 45 ug/dL (ref 45–160)
TIBC: 450 mcg/dL (calc) (ref 250–450)

## 2019-10-14 LAB — TSH: TSH: 2.62 mIU/L (ref 0.40–4.50)

## 2019-10-14 LAB — LIPID PANEL
Cholesterol: 179 mg/dL (ref <200)
HDL: 67 mg/dL (ref >=50)
LDL Cholesterol (Calc): 78 mg/dL (calc)
Non-HDL Cholesterol (Calc): 112 mg/dL (calc) (ref <130)
Total CHOL/HDL Ratio: 2.7 (calc) (ref <5.0)
Triglycerides: 261 mg/dL — ABNORMAL HIGH (ref <150)

## 2019-10-14 LAB — TEST AUTHORIZATION

## 2019-10-14 MED ORDER — POLYSACCHARIDE IRON COMPLEX 150 MG PO CAPS: 150.0000 mg | ORAL_CAPSULE | Freq: Every day | ORAL | 3 refills | Status: DC

## 2019-10-14 NOTE — Progress Notes (Signed)
She is iron deficient.  I recommend she start on an iron supplement-ferrous gluconate 150mg  daily with a meal.  [facility].

## 2019-10-16 DIAGNOSIS — F0391 Unspecified dementia with behavioral disturbance: Secondary | ICD-10-CM | POA: Diagnosis not present

## 2019-10-16 DIAGNOSIS — M6281 Muscle weakness (generalized): Secondary | ICD-10-CM | POA: Diagnosis not present

## 2019-10-16 DIAGNOSIS — F419 Anxiety disorder, unspecified: Secondary | ICD-10-CM | POA: Diagnosis not present

## 2019-10-16 DIAGNOSIS — I890 Lymphedema, not elsewhere classified: Secondary | ICD-10-CM | POA: Diagnosis not present

## 2019-10-16 DIAGNOSIS — I129 Hypertensive chronic kidney disease with stage 1 through stage 4 chronic kidney disease, or unspecified chronic kidney disease: Secondary | ICD-10-CM | POA: Diagnosis not present

## 2019-10-16 DIAGNOSIS — N189 Chronic kidney disease, unspecified: Secondary | ICD-10-CM | POA: Diagnosis not present

## 2019-10-17 DIAGNOSIS — I890 Lymphedema, not elsewhere classified: Secondary | ICD-10-CM | POA: Diagnosis not present

## 2019-10-17 DIAGNOSIS — M6281 Muscle weakness (generalized): Secondary | ICD-10-CM | POA: Diagnosis not present

## 2019-10-17 DIAGNOSIS — N189 Chronic kidney disease, unspecified: Secondary | ICD-10-CM | POA: Diagnosis not present

## 2019-10-17 DIAGNOSIS — F0391 Unspecified dementia with behavioral disturbance: Secondary | ICD-10-CM | POA: Diagnosis not present

## 2019-10-17 DIAGNOSIS — F419 Anxiety disorder, unspecified: Secondary | ICD-10-CM | POA: Diagnosis not present

## 2019-10-17 DIAGNOSIS — I129 Hypertensive chronic kidney disease with stage 1 through stage 4 chronic kidney disease, or unspecified chronic kidney disease: Secondary | ICD-10-CM | POA: Diagnosis not present

## 2019-10-18 DIAGNOSIS — I129 Hypertensive chronic kidney disease with stage 1 through stage 4 chronic kidney disease, or unspecified chronic kidney disease: Secondary | ICD-10-CM | POA: Diagnosis not present

## 2019-10-18 DIAGNOSIS — I890 Lymphedema, not elsewhere classified: Secondary | ICD-10-CM | POA: Diagnosis not present

## 2019-10-18 DIAGNOSIS — N189 Chronic kidney disease, unspecified: Secondary | ICD-10-CM | POA: Diagnosis not present

## 2019-10-18 DIAGNOSIS — M6281 Muscle weakness (generalized): Secondary | ICD-10-CM | POA: Diagnosis not present

## 2019-10-18 DIAGNOSIS — F419 Anxiety disorder, unspecified: Secondary | ICD-10-CM | POA: Diagnosis not present

## 2019-10-18 DIAGNOSIS — F0391 Unspecified dementia with behavioral disturbance: Secondary | ICD-10-CM | POA: Diagnosis not present

## 2019-10-19 ENCOUNTER — Ambulatory Visit: Payer: Medicare Other | Admitting: Internal Medicine

## 2019-11-07 ENCOUNTER — Encounter: Payer: Self-pay | Admitting: Internal Medicine

## 2019-11-08 ENCOUNTER — Other Ambulatory Visit: Payer: Self-pay | Admitting: Internal Medicine

## 2019-11-08 DIAGNOSIS — G629 Polyneuropathy, unspecified: Secondary | ICD-10-CM

## 2019-11-08 DIAGNOSIS — M6281 Muscle weakness (generalized): Secondary | ICD-10-CM

## 2019-11-08 DIAGNOSIS — E441 Mild protein-calorie malnutrition: Secondary | ICD-10-CM

## 2019-11-08 DIAGNOSIS — I890 Lymphedema, not elsewhere classified: Secondary | ICD-10-CM

## 2019-11-08 DIAGNOSIS — F0391 Unspecified dementia with behavioral disturbance: Secondary | ICD-10-CM

## 2019-11-13 DIAGNOSIS — F0390 Unspecified dementia without behavioral disturbance: Secondary | ICD-10-CM | POA: Diagnosis not present

## 2019-11-15 DIAGNOSIS — M1710 Unilateral primary osteoarthritis, unspecified knee: Secondary | ICD-10-CM | POA: Diagnosis not present

## 2019-11-15 DIAGNOSIS — M8580 Other specified disorders of bone density and structure, unspecified site: Secondary | ICD-10-CM | POA: Diagnosis not present

## 2019-11-15 DIAGNOSIS — I5032 Chronic diastolic (congestive) heart failure: Secondary | ICD-10-CM | POA: Diagnosis not present

## 2019-11-15 DIAGNOSIS — Z87891 Personal history of nicotine dependence: Secondary | ICD-10-CM | POA: Diagnosis not present

## 2019-11-15 DIAGNOSIS — N189 Chronic kidney disease, unspecified: Secondary | ICD-10-CM | POA: Diagnosis not present

## 2019-11-15 DIAGNOSIS — H903 Sensorineural hearing loss, bilateral: Secondary | ICD-10-CM | POA: Diagnosis not present

## 2019-11-15 DIAGNOSIS — I130 Hypertensive heart and chronic kidney disease with heart failure and stage 1 through stage 4 chronic kidney disease, or unspecified chronic kidney disease: Secondary | ICD-10-CM | POA: Diagnosis not present

## 2019-11-15 DIAGNOSIS — G629 Polyneuropathy, unspecified: Secondary | ICD-10-CM | POA: Diagnosis not present

## 2019-11-15 DIAGNOSIS — K219 Gastro-esophageal reflux disease without esophagitis: Secondary | ICD-10-CM | POA: Diagnosis not present

## 2019-11-15 DIAGNOSIS — F419 Anxiety disorder, unspecified: Secondary | ICD-10-CM | POA: Diagnosis not present

## 2019-11-15 DIAGNOSIS — E441 Mild protein-calorie malnutrition: Secondary | ICD-10-CM | POA: Diagnosis not present

## 2019-11-15 DIAGNOSIS — I890 Lymphedema, not elsewhere classified: Secondary | ICD-10-CM

## 2019-11-15 DIAGNOSIS — Z853 Personal history of malignant neoplasm of breast: Secondary | ICD-10-CM | POA: Diagnosis not present

## 2019-11-15 DIAGNOSIS — G4700 Insomnia, unspecified: Secondary | ICD-10-CM | POA: Diagnosis not present

## 2019-11-15 DIAGNOSIS — K5730 Diverticulosis of large intestine without perforation or abscess without bleeding: Secondary | ICD-10-CM

## 2019-11-15 DIAGNOSIS — Z9181 History of falling: Secondary | ICD-10-CM | POA: Diagnosis not present

## 2019-11-15 DIAGNOSIS — E785 Hyperlipidemia, unspecified: Secondary | ICD-10-CM | POA: Diagnosis not present

## 2019-11-15 DIAGNOSIS — M419 Scoliosis, unspecified: Secondary | ICD-10-CM | POA: Diagnosis not present

## 2019-11-15 DIAGNOSIS — F0391 Unspecified dementia with behavioral disturbance: Secondary | ICD-10-CM | POA: Diagnosis not present

## 2019-11-21 DIAGNOSIS — I130 Hypertensive heart and chronic kidney disease with heart failure and stage 1 through stage 4 chronic kidney disease, or unspecified chronic kidney disease: Secondary | ICD-10-CM | POA: Diagnosis not present

## 2019-11-21 DIAGNOSIS — G629 Polyneuropathy, unspecified: Secondary | ICD-10-CM | POA: Diagnosis not present

## 2019-11-21 DIAGNOSIS — N189 Chronic kidney disease, unspecified: Secondary | ICD-10-CM | POA: Diagnosis not present

## 2019-11-21 DIAGNOSIS — I5032 Chronic diastolic (congestive) heart failure: Secondary | ICD-10-CM | POA: Diagnosis not present

## 2019-11-21 DIAGNOSIS — F0391 Unspecified dementia with behavioral disturbance: Secondary | ICD-10-CM | POA: Diagnosis not present

## 2019-11-21 DIAGNOSIS — E441 Mild protein-calorie malnutrition: Secondary | ICD-10-CM | POA: Diagnosis not present

## 2019-11-22 ENCOUNTER — Telehealth: Payer: Self-pay

## 2019-11-22 NOTE — Telephone Encounter (Signed)
Minda from [facility] called and states that she needs a verbal order for patient "[name] Morton" to have speech therapy twice a week for two weeks and then once a week for one week. Alla Feeling also is requesting verbal order for medical social worker evaluation. Please Advise.

## 2019-11-22 NOTE — Telephone Encounter (Signed)
Agree with the speech therapy and social work referrals.

## 2019-11-22 NOTE — Telephone Encounter (Signed)
Called back and left detailed voicemail.

## 2019-11-23 DIAGNOSIS — I130 Hypertensive heart and chronic kidney disease with heart failure and stage 1 through stage 4 chronic kidney disease, or unspecified chronic kidney disease: Secondary | ICD-10-CM | POA: Diagnosis not present

## 2019-11-23 DIAGNOSIS — E441 Mild protein-calorie malnutrition: Secondary | ICD-10-CM | POA: Diagnosis not present

## 2019-11-23 DIAGNOSIS — F0391 Unspecified dementia with behavioral disturbance: Secondary | ICD-10-CM | POA: Diagnosis not present

## 2019-11-23 DIAGNOSIS — I5032 Chronic diastolic (congestive) heart failure: Secondary | ICD-10-CM | POA: Diagnosis not present

## 2019-11-23 DIAGNOSIS — N189 Chronic kidney disease, unspecified: Secondary | ICD-10-CM | POA: Diagnosis not present

## 2019-11-23 DIAGNOSIS — G629 Polyneuropathy, unspecified: Secondary | ICD-10-CM | POA: Diagnosis not present

## 2019-11-25 DIAGNOSIS — Z23 Encounter for immunization: Secondary | ICD-10-CM | POA: Diagnosis not present

## 2019-11-28 ENCOUNTER — Ambulatory Visit: Payer: Medicare Other

## 2019-11-28 ENCOUNTER — Ambulatory Visit: Payer: Medicare Other | Attending: Internal Medicine

## 2019-11-28 DIAGNOSIS — Z23 Encounter for immunization: Secondary | ICD-10-CM

## 2019-11-28 NOTE — Progress Notes (Signed)
   Covid-19 Vaccination Clinic  Name:  [name] Morton    MRN: [id number] DOB: March 16, 1926  11/28/2019  Ms. [name] Morton was observed post Covid-19 immunization for 15 minutes without incident. She was provided with Vaccine Information Sheet and instruction to access the V-Safe system.   Ms. [name] Morton was instructed to call 911 with any severe reactions post vaccine: [name] Kitchen Difficulty breathing  . Swelling of face and throat  . A fast heartbeat  . A bad rash all over body  . Dizziness and weakness

## 2019-11-29 ENCOUNTER — Telehealth: Payer: Self-pay | Admitting: Internal Medicine

## 2019-11-29 DIAGNOSIS — I130 Hypertensive heart and chronic kidney disease with heart failure and stage 1 through stage 4 chronic kidney disease, or unspecified chronic kidney disease: Secondary | ICD-10-CM | POA: Diagnosis not present

## 2019-11-29 DIAGNOSIS — N189 Chronic kidney disease, unspecified: Secondary | ICD-10-CM | POA: Diagnosis not present

## 2019-11-29 DIAGNOSIS — I5032 Chronic diastolic (congestive) heart failure: Secondary | ICD-10-CM | POA: Diagnosis not present

## 2019-11-29 DIAGNOSIS — E441 Mild protein-calorie malnutrition: Secondary | ICD-10-CM | POA: Diagnosis not present

## 2019-11-29 DIAGNOSIS — G629 Polyneuropathy, unspecified: Secondary | ICD-10-CM | POA: Diagnosis not present

## 2019-11-29 DIAGNOSIS — F0391 Unspecified dementia with behavioral disturbance: Secondary | ICD-10-CM | POA: Diagnosis not present

## 2019-11-29 NOTE — Telephone Encounter (Signed)
Patients daughter(POA) Renard Hamper called and said that her mother was supposed to have a speech therapy session at 77 and her mother never came to the door, her sister was there with her mother and when her or the speech therapist couldn't get a hold of her mother the sister called 49. They are unsure if anything has happened or if the mother is just not wanting visitors. POA just wanted to make note of this incident.

## 2019-11-29 NOTE — Telephone Encounter (Signed)
[name] Morton called and said today her mother [name] Morton was being stubborn and would not let anyone in the home. And [name] Morton had said she was not going to accept anymore help with the in home health anymore so if her mother does stop letting them come in that's why the daughter said social services should be coming Friday Oct.1st to see her mother and she's going to keep that appt with them and just see how things with her mother go but she does think the dementia is getting a lot worse and [name] Morton is just not wanting to deal with people.

## 2019-11-29 NOTE — Telephone Encounter (Signed)
   Patients daughter(POA) Renard Hamper called and said that her mother was supposed to have a speech therapy session at 31 and her mother never came to the door, her sister was there with her mother and when her or the speech therapist couldn't get a hold of her mother the sister called 46. They are unsure if anything has happened or if the mother is just not wanting visitors. POA just wanted to make note of this incident.

## 2019-11-29 NOTE — Telephone Encounter (Signed)
   Patients daughter(POA) Renard Hamper called and said that her mother was supposed to have a speech therapy session at 18 and her mother never came to the door, her sister was there with her mother and when her or the speech therapist couldn't get a hold of her mother the sister called 48. They are unsure if anything has happened or if the mother is just not wanting visitors. POA just wanted to make note of this incident.

## 2019-12-01 ENCOUNTER — Telehealth: Payer: Self-pay | Admitting: Internal Medicine

## 2019-12-01 DIAGNOSIS — I130 Hypertensive heart and chronic kidney disease with heart failure and stage 1 through stage 4 chronic kidney disease, or unspecified chronic kidney disease: Secondary | ICD-10-CM | POA: Diagnosis not present

## 2019-12-01 DIAGNOSIS — N189 Chronic kidney disease, unspecified: Secondary | ICD-10-CM | POA: Diagnosis not present

## 2019-12-01 DIAGNOSIS — G629 Polyneuropathy, unspecified: Secondary | ICD-10-CM | POA: Diagnosis not present

## 2019-12-01 DIAGNOSIS — F0391 Unspecified dementia with behavioral disturbance: Secondary | ICD-10-CM | POA: Diagnosis not present

## 2019-12-01 DIAGNOSIS — I5032 Chronic diastolic (congestive) heart failure: Secondary | ICD-10-CM | POA: Diagnosis not present

## 2019-12-01 DIAGNOSIS — E441 Mild protein-calorie malnutrition: Secondary | ICD-10-CM | POA: Diagnosis not present
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
## 2019-12-05 DIAGNOSIS — F0391 Unspecified dementia with behavioral disturbance: Secondary | ICD-10-CM | POA: Diagnosis not present

## 2019-12-05 DIAGNOSIS — N189 Chronic kidney disease, unspecified: Secondary | ICD-10-CM | POA: Diagnosis not present

## 2019-12-05 DIAGNOSIS — I5032 Chronic diastolic (congestive) heart failure: Secondary | ICD-10-CM | POA: Diagnosis not present

## 2019-12-05 DIAGNOSIS — G629 Polyneuropathy, unspecified: Secondary | ICD-10-CM | POA: Diagnosis not present

## 2019-12-05 DIAGNOSIS — E441 Mild protein-calorie malnutrition: Secondary | ICD-10-CM | POA: Diagnosis not present

## 2019-12-05 DIAGNOSIS — I130 Hypertensive heart and chronic kidney disease with heart failure and stage 1 through stage 4 chronic kidney disease, or unspecified chronic kidney disease: Secondary | ICD-10-CM | POA: Diagnosis not present

## 2019-12-07 DIAGNOSIS — I5032 Chronic diastolic (congestive) heart failure: Secondary | ICD-10-CM | POA: Diagnosis not present

## 2019-12-07 DIAGNOSIS — F0391 Unspecified dementia with behavioral disturbance: Secondary | ICD-10-CM | POA: Diagnosis not present

## 2019-12-07 DIAGNOSIS — I130 Hypertensive heart and chronic kidney disease with heart failure and stage 1 through stage 4 chronic kidney disease, or unspecified chronic kidney disease: Secondary | ICD-10-CM | POA: Diagnosis not present

## 2019-12-07 DIAGNOSIS — G629 Polyneuropathy, unspecified: Secondary | ICD-10-CM | POA: Diagnosis not present

## 2019-12-07 DIAGNOSIS — E441 Mild protein-calorie malnutrition: Secondary | ICD-10-CM | POA: Diagnosis not present

## 2019-12-07 DIAGNOSIS — N189 Chronic kidney disease, unspecified: Secondary | ICD-10-CM | POA: Diagnosis not present

## 2019-12-08 DIAGNOSIS — N189 Chronic kidney disease, unspecified: Secondary | ICD-10-CM | POA: Diagnosis not present

## 2019-12-08 DIAGNOSIS — I5032 Chronic diastolic (congestive) heart failure: Secondary | ICD-10-CM | POA: Diagnosis not present

## 2019-12-08 DIAGNOSIS — G629 Polyneuropathy, unspecified: Secondary | ICD-10-CM | POA: Diagnosis not present

## 2019-12-08 DIAGNOSIS — I130 Hypertensive heart and chronic kidney disease with heart failure and stage 1 through stage 4 chronic kidney disease, or unspecified chronic kidney disease: Secondary | ICD-10-CM | POA: Diagnosis not present

## 2019-12-08 DIAGNOSIS — E441 Mild protein-calorie malnutrition: Secondary | ICD-10-CM | POA: Diagnosis not present

## 2019-12-08 DIAGNOSIS — F0391 Unspecified dementia with behavioral disturbance: Secondary | ICD-10-CM | POA: Diagnosis not present

## 2019-12-11 DIAGNOSIS — F0391 Unspecified dementia with behavioral disturbance: Secondary | ICD-10-CM | POA: Diagnosis not present

## 2019-12-11 DIAGNOSIS — G629 Polyneuropathy, unspecified: Secondary | ICD-10-CM | POA: Diagnosis not present

## 2019-12-11 DIAGNOSIS — N189 Chronic kidney disease, unspecified: Secondary | ICD-10-CM | POA: Diagnosis not present

## 2019-12-11 DIAGNOSIS — I5032 Chronic diastolic (congestive) heart failure: Secondary | ICD-10-CM | POA: Diagnosis not present

## 2019-12-11 DIAGNOSIS — I130 Hypertensive heart and chronic kidney disease with heart failure and stage 1 through stage 4 chronic kidney disease, or unspecified chronic kidney disease: Secondary | ICD-10-CM | POA: Diagnosis not present

## 2019-12-11 DIAGNOSIS — E441 Mild protein-calorie malnutrition: Secondary | ICD-10-CM | POA: Diagnosis not present

## 2019-12-14 DIAGNOSIS — E441 Mild protein-calorie malnutrition: Secondary | ICD-10-CM | POA: Diagnosis not present

## 2019-12-14 DIAGNOSIS — N189 Chronic kidney disease, unspecified: Secondary | ICD-10-CM | POA: Diagnosis not present

## 2019-12-14 DIAGNOSIS — I130 Hypertensive heart and chronic kidney disease with heart failure and stage 1 through stage 4 chronic kidney disease, or unspecified chronic kidney disease: Secondary | ICD-10-CM | POA: Diagnosis not present

## 2019-12-14 DIAGNOSIS — I5032 Chronic diastolic (congestive) heart failure: Secondary | ICD-10-CM | POA: Diagnosis not present

## 2019-12-14 DIAGNOSIS — F0391 Unspecified dementia with behavioral disturbance: Secondary | ICD-10-CM | POA: Diagnosis not present

## 2019-12-14 DIAGNOSIS — G629 Polyneuropathy, unspecified: Secondary | ICD-10-CM | POA: Diagnosis not present

## 2019-12-15 DIAGNOSIS — H903 Sensorineural hearing loss, bilateral: Secondary | ICD-10-CM | POA: Diagnosis not present

## 2019-12-15 DIAGNOSIS — I5032 Chronic diastolic (congestive) heart failure: Secondary | ICD-10-CM | POA: Diagnosis not present

## 2019-12-15 DIAGNOSIS — Z9181 History of falling: Secondary | ICD-10-CM | POA: Diagnosis not present

## 2019-12-15 DIAGNOSIS — Z87891 Personal history of nicotine dependence: Secondary | ICD-10-CM | POA: Diagnosis not present

## 2019-12-15 DIAGNOSIS — E785 Hyperlipidemia, unspecified: Secondary | ICD-10-CM | POA: Diagnosis not present

## 2019-12-15 DIAGNOSIS — K219 Gastro-esophageal reflux disease without esophagitis: Secondary | ICD-10-CM | POA: Diagnosis not present

## 2019-12-15 DIAGNOSIS — M8580 Other specified disorders of bone density and structure, unspecified site: Secondary | ICD-10-CM | POA: Diagnosis not present

## 2019-12-15 DIAGNOSIS — F419 Anxiety disorder, unspecified: Secondary | ICD-10-CM | POA: Diagnosis not present

## 2019-12-15 DIAGNOSIS — F0391 Unspecified dementia with behavioral disturbance: Secondary | ICD-10-CM | POA: Diagnosis not present

## 2019-12-15 DIAGNOSIS — E441 Mild protein-calorie malnutrition: Secondary | ICD-10-CM | POA: Diagnosis not present

## 2019-12-15 DIAGNOSIS — I130 Hypertensive heart and chronic kidney disease with heart failure and stage 1 through stage 4 chronic kidney disease, or unspecified chronic kidney disease: Secondary | ICD-10-CM | POA: Diagnosis not present

## 2019-12-15 DIAGNOSIS — M419 Scoliosis, unspecified: Secondary | ICD-10-CM | POA: Diagnosis not present

## 2019-12-15 DIAGNOSIS — G629 Polyneuropathy, unspecified: Secondary | ICD-10-CM | POA: Diagnosis not present

## 2019-12-15 DIAGNOSIS — N189 Chronic kidney disease, unspecified: Secondary | ICD-10-CM | POA: Diagnosis not present

## 2019-12-15 DIAGNOSIS — M1710 Unilateral primary osteoarthritis, unspecified knee: Secondary | ICD-10-CM | POA: Diagnosis not present

## 2019-12-15 DIAGNOSIS — K5730 Diverticulosis of large intestine without perforation or abscess without bleeding: Secondary | ICD-10-CM | POA: Diagnosis not present

## 2019-12-15 DIAGNOSIS — I890 Lymphedema, not elsewhere classified: Secondary | ICD-10-CM | POA: Diagnosis not present

## 2019-12-15 DIAGNOSIS — Z853 Personal history of malignant neoplasm of breast: Secondary | ICD-10-CM | POA: Diagnosis not present

## 2019-12-15 DIAGNOSIS — G4700 Insomnia, unspecified: Secondary | ICD-10-CM | POA: Diagnosis not present

## 2019-12-21 DIAGNOSIS — E441 Mild protein-calorie malnutrition: Secondary | ICD-10-CM | POA: Diagnosis not present

## 2019-12-21 DIAGNOSIS — I130 Hypertensive heart and chronic kidney disease with heart failure and stage 1 through stage 4 chronic kidney disease, or unspecified chronic kidney disease: Secondary | ICD-10-CM | POA: Diagnosis not present

## 2019-12-21 DIAGNOSIS — I5032 Chronic diastolic (congestive) heart failure: Secondary | ICD-10-CM | POA: Diagnosis not present

## 2019-12-21 DIAGNOSIS — G629 Polyneuropathy, unspecified: Secondary | ICD-10-CM | POA: Diagnosis not present

## 2019-12-21 DIAGNOSIS — N189 Chronic kidney disease, unspecified: Secondary | ICD-10-CM | POA: Diagnosis not present

## 2019-12-21 DIAGNOSIS — F0391 Unspecified dementia with behavioral disturbance: Secondary | ICD-10-CM | POA: Diagnosis not present

## 2019-12-28 ENCOUNTER — Telehealth: Payer: Self-pay | Admitting: *Deleted

## 2019-12-28 ENCOUNTER — Other Ambulatory Visit: Payer: Self-pay | Admitting: Internal Medicine

## 2019-12-28 DIAGNOSIS — I5032 Chronic diastolic (congestive) heart failure: Secondary | ICD-10-CM

## 2019-12-28 DIAGNOSIS — G629 Polyneuropathy, unspecified: Secondary | ICD-10-CM

## 2019-12-28 DIAGNOSIS — I10 Essential (primary) hypertension: Secondary | ICD-10-CM

## 2019-12-28 DIAGNOSIS — E785 Hyperlipidemia, unspecified: Secondary | ICD-10-CM

## 2019-12-28 DIAGNOSIS — G8929 Other chronic pain: Secondary | ICD-10-CM

## 2019-12-28 DIAGNOSIS — F0391 Unspecified dementia with behavioral disturbance: Secondary | ICD-10-CM

## 2019-12-28 MED ORDER — GABAPENTIN 100 MG PO CAPS: ORAL_CAPSULE | ORAL | 0 refills | Status: DC

## 2019-12-28 MED ORDER — FUROSEMIDE 40 MG PO TABS: ORAL_TABLET | ORAL | 0 refills | Status: DC

## 2019-12-28 MED ORDER — MIRTAZAPINE 7.5 MG PO TABS: 7.5000 mg | ORAL_TABLET | Freq: Every day | ORAL | 0 refills | Status: DC

## 2019-12-28 MED ORDER — SIMVASTATIN 20 MG PO TABS: 20.0000 mg | ORAL_TABLET | Freq: Every day | ORAL | 0 refills | Status: DC

## 2019-12-28 MED ORDER — DICLOFENAC SODIUM 1 % EX GEL: 2.0000 g | Freq: Four times a day (QID) | CUTANEOUS | 0 refills | Status: DC

## 2019-12-28 MED ORDER — POLYSACCHARIDE IRON COMPLEX 150 MG PO CAPS
150.0000 mg | ORAL_CAPSULE | Freq: Every day | ORAL | 0 refills | Status: DC
Start: 2019-12-28 — End: 2019-12-28

## 2019-12-28 MED ORDER — DONEPEZIL HCL 5 MG PO TABS: 5.0000 mg | ORAL_TABLET | Freq: Every day | ORAL | 0 refills | Status: DC

## 2019-12-28 MED ORDER — AMLODIPINE BESY-BENAZEPRIL HCL 10-20 MG PO CAPS: 1.0000 | ORAL_CAPSULE | Freq: Every day | ORAL | 0 refills | Status: DC

## 2019-12-28 MED ORDER — BISOPROLOL FUMARATE 5 MG PO TABS: 5.0000 mg | ORAL_TABLET | Freq: Every day | ORAL | 0 refills | Status: DC

## 2019-12-28 MED FILL — FUROSEMIDE 40 MG TAB: 40 | 30 days supply | Qty: 45 | Fill #0

## 2019-12-28 MED FILL — GABAPENTIN 100 MG CAPSULE: 100 | 30 days supply | Qty: 90 | Fill #0

## 2019-12-28 MED FILL — FERREX 150 CAPSULE: 150 | 30 days supply | Qty: 30 | Fill #0

## 2019-12-28 MED FILL — SIMVASTATIN 20 MG TABLET: 20 | 30 days supply | Qty: 30 | Fill #0

## 2019-12-28 MED FILL — AMLODIPINE-BENAZEPRIL 10-20: 10-20 | 30 days supply | Qty: 30 | Fill #0

## 2019-12-28 NOTE — Telephone Encounter (Signed)
Pended Rx's and sent to Dr. [name] for approval due to [place].

## 2019-12-28 NOTE — Telephone Encounter (Signed)
[name], daughter [phone] called and stated that patient does not have any of her medications. Stated that Con Memos, sister, has them all and won't give them back or communicate with them and now patient is out of her medications.   [name] is wanting a 30 day supply of all of patient's medications sent to [facility].   Please Advise.

## 2019-12-28 NOTE — Telephone Encounter (Signed)
Fine with me to send the 30 day supply of all meds

## 2019-12-29 MED FILL — BISOPROLOL FUMARATE 5 MG TA: 5 | 30 days supply | Qty: 30 | Fill #0

## 2019-12-29 MED FILL — DONEPEZIL HCL 5 MG TABLET: 5 | 30 days supply | Qty: 30 | Fill #0

## 2020-01-05 DIAGNOSIS — F0391 Unspecified dementia with behavioral disturbance: Secondary | ICD-10-CM | POA: Diagnosis not present

## 2020-01-05 DIAGNOSIS — I130 Hypertensive heart and chronic kidney disease with heart failure and stage 1 through stage 4 chronic kidney disease, or unspecified chronic kidney disease: Secondary | ICD-10-CM | POA: Diagnosis not present

## 2020-01-05 DIAGNOSIS — I5032 Chronic diastolic (congestive) heart failure: Secondary | ICD-10-CM | POA: Diagnosis not present

## 2020-01-05 DIAGNOSIS — E441 Mild protein-calorie malnutrition: Secondary | ICD-10-CM | POA: Diagnosis not present

## 2020-01-05 DIAGNOSIS — G629 Polyneuropathy, unspecified: Secondary | ICD-10-CM | POA: Diagnosis not present

## 2020-01-05 DIAGNOSIS — N189 Chronic kidney disease, unspecified: Secondary | ICD-10-CM | POA: Diagnosis not present

## 2020-04-02 ENCOUNTER — Encounter: Payer: Self-pay | Admitting: Internal Medicine

## 2020-04-05 ENCOUNTER — Ambulatory Visit (INDEPENDENT_AMBULATORY_CARE_PROVIDER_SITE_OTHER): Payer: Medicare Other | Admitting: Orthopedic Surgery

## 2020-04-05 ENCOUNTER — Encounter: Payer: Self-pay | Admitting: Orthopedic Surgery

## 2020-04-05 ENCOUNTER — Other Ambulatory Visit: Payer: Self-pay | Admitting: Orthopedic Surgery

## 2020-04-05 ENCOUNTER — Other Ambulatory Visit: Payer: Self-pay

## 2020-04-05 VITALS — BP 140/60 | HR 69 | Temp 97.6°F | Resp 20 | Ht 60.0 in | Wt 144.8 lb

## 2020-04-05 DIAGNOSIS — H9202 Otalgia, left ear: Secondary | ICD-10-CM

## 2020-04-05 DIAGNOSIS — L03818 Cellulitis of other sites: Secondary | ICD-10-CM

## 2020-04-05 MED ORDER — DOXYCYCLINE HYCLATE 100 MG PO TABS: 100.0000 mg | ORAL_TABLET | Freq: Two times a day (BID) | ORAL | 0 refills | Status: DC

## 2020-04-05 MED FILL — DOXYCYCLINE HYCLATE 100 MG: 100 | 10 days supply | Qty: 20 | Fill #0

## 2020-04-05 NOTE — Progress Notes (Signed)
Location:   [place]:   [place] Provider:  Windell Moulding, AGNP-C  Gayland Curry, DO  Patient Care Team: Gayland Curry, DO as PCP - General (Geriatric Medicine) Wonda Horner, MD as Consulting Physician (Gastroenterology) Gaynelle Arabian, MD as Consulting Physician (Orthopedic Surgery) Leonie Man, MD as Consulting Physician (Cardiology) Shon Hough, MD as Consulting Physician (Ophthalmology)  Extended Emergency Contact Information Primary Emergency Contact: Kendrick,[name] Address: [place], [place] [address] [address] [place] of [place] Phone: [phone] Mobile Phone: [phone] Relation: Daughter Secondary Emergency Contact: Gae Gallop [place] of [place] Phone: [phone] Mobile Phone: [phone] Relation: Daughter  Goals of care: Advanced Directive information Advanced Directives 04/05/2020  Does Patient Have a Medical Advance Directive? Yes  Type of Advance Directive Living will  Does patient want to make changes to medical advance directive? No - Patient declined  Copy of [place] in Chart? -  Would patient like information on creating a medical advance directive? -  Pre-existing out of facility DNR order (yellow form or pink MOST form) -     Chief Complaint  Patient presents with  . Acute Visit    Patient c/o sore on ear complicating placement of hearing aid     HPI:  Pt is a 87 y.o. female seen today for an acute visit for left outer ear pain.   Daughter present for encounter.   Left ear pain began a few days ago. States it is hard for her to wear hearing aid in same ear. Pain 6/10. Outer ear tender to touch. Normally takes tylenol 1300 mg twice daily. Does not think scheduled tylenol is helping ear pain. Has not tired any other interventions. Does not report any other symptoms. Cannot state when she last cleaned hearing aid. She is followed  by a audiologist.    Past Medical History:  Diagnosis Date  . Breast cancer ([place]) 1997   right - tx with lumpectomty & radiation  . Burning sensation of feet    Per New Patient Packet-[facility]   . Chronic anxiety   . Chronic kidney disease    Per New Patient Packet-[facility]   . Cognitive impairment    Per New Patient Packet-[facility]   . Diverticulosis   . Dyslipidemia   . Fluid retention    Per New Patient Packet-[facility]   . Gait difficulty   . GERD (gastroesophageal reflux disease)    RARE - NO MEDICATIONS  . Hearing impairment    BILATERAL HEARING AIDS  . Heart murmur    Per New Patient Packet-[facility]   . High cholesterol    Per New Patient Packet-[facility]   . History of nuclear stress test 11/2009   dipyridamole; no evidence of ischemia, normal, low risk   . History of shingles 1998   NO RESIDUAL PROBLEMS  . Hypertension    SEES CARDIOLOGIST DR. DAVID HARDING FOR HYPERTENSION   . Insomnia   . Lymphedema of arm    RIGHT   . Mitral and aortic regurgitation   . OA (osteoarthritis)    OA AND PAIN LEFT KNEE  . Osteopenia   . Scoliosis    Per New Patient Packet-PSC   . Shortness of breath    WHEN CLIMBING STEPS AT MY HOME--OTHERWISE OK  . Sinus problem   . Sleep difficulties    ALWAYS HAD SLEEPING PROBLEMS - MEDICATION HELPS PT SLEEP ABOUT  3 HOURS  . SOB (shortness of breath)    Per New Patient Packet-PSC, hospitalized 2018    Past Surgical History:  Procedure Laterality Date  . ABDOMINAL HYSTERECTOMY  1973  . APPENDECTOMY  1951  . BREAST ADENOMA  1950   EXCISED  . BREAST LUMPECTOMY Right 1997  . COLONOSCOPY  2014   Dr.Sam Penelope Coop, Per New Patient Packet-PSC   . EYE SURGERY     BILATERAL CATARACT EXTRACTIONS AND LENS IMPLANTS  . NM MYOVIEW LTD  08/2016   Hyperdynamic LV function EF 65%. No ischemia or infarction. LOW RISK  . REPLACEMENT TOTAL KNEE  2002  . ROTATOR CUFF REPAIR Right 12/2009   Dr. Lenna Sciara. [place]   . TOTAL KNEE ARTHROPLASTY Left 05/01/2013   Procedure: LEFT TOTAL KNEE  ARTHROPLASTY;  Surgeon: Gearlean Alf, MD;  Location: [facility];  Service: Orthopedics;  Laterality: Left;  . TRANSTHORACIC ECHOCARDIOGRAM  05/1994   trace mitral/tricuspic/aortic inusff; hyperkinetic LV  . TRANSTHORACIC ECHOCARDIOGRAM  08/2016   Normal LV size. EF 65-70%. GR 1 DD. (Normal for age). Mild aortic stenosis    Allergies  Allergen Reactions  . Benadryl [Diphenhydramine Hcl (Sleep)]     Makes me hyper   . Cardizem [Diltiazem Hcl]     Doesn't remember. Dr Rex Kras told patient not to take.   [name] Kitchen Ceftin [Cefuroxime Axetil] Other (See Comments)    Stomach Issues   . Epinephrine Other (See Comments)    unknown  . Oxycodone Hcl     Loss of appetite: sores on tongue and mouth  . Tetanus Toxoids Other (See Comments)    Unknown   . Tramadol Hcl     Loss of appetite: dry mouth/sores on tongue and mouth  . Vistaril [Hydroxyzine] Other (See Comments)    unknown  . Vytorin [Ezetimibe-Simvastatin]     Not comfortable taking it. Doesn't remember reaction.     Outpatient Encounter Medications as of 04/05/2020  Medication Sig  . acetaminophen (TYLENOL) 650 MG CR tablet Take 1,300 mg by mouth 2 (two) times daily.  [name] Kitchen amLODipine-benazepril (LOTREL) 10-20 MG capsule Take 1 capsule by mouth daily.  . bisoprolol (ZEBETA) 5 MG tablet Take 1 tablet (5 mg total) by mouth daily.  . cyanocobalamin 1000 MCG tablet Take 1,000 mcg by mouth daily.  . diclofenac Sodium (VOLTAREN) 1 % GEL Apply 2 g topically 4 (four) times daily as needed.  . donepezil (ARICEPT) 5 MG tablet Take 1 tablet (5 mg total) by mouth at bedtime. For memory loss  . furosemide (LASIX) 40 MG tablet TAKE 40 MG BY MOUTH DAILY AND MAY TAKE AN EXTRA 20 MG ( 1/2 TABLET) IF NEEDED FOR PUFFINESS  . gabapentin (NEURONTIN) 100 MG capsule Take 3 capsules every night  . iron polysaccharides (NU-IRON) 150 MG capsule Take 1 capsule (150 mg total) by mouth daily. WITH FOOD  . Liniments (SALONPAS EX) Apply 1 application topically as needed (back  pain).   . Melatonin 5 MG CAPS Take 5 mg by mouth daily after supper.  . Menthol, Topical Analgesic, 4 % GEL Apply 1 application topically 3 (three) times daily as needed. Massage into right arm three times a day for pain.  . mirtazapine (REMERON) 7.5 MG tablet Take 1 tablet (7.5 mg total) by mouth at bedtime.  . simvastatin (ZOCOR) 20 MG tablet Take 1 tablet (20 mg total) by mouth at bedtime.  . [DISCONTINUED] diclofenac Sodium (VOLTAREN) 1 % GEL Apply 2 g topically 4 (four) times daily. Apply to left  shoulder for pain   No facility-administered encounter medications on file as of 04/05/2020.    Review of Systems  Constitutional: Negative for fever.  HENT: Positive for ear pain and hearing loss. Negative for congestion.        Left hearing aid  Respiratory: Negative for cough, shortness of breath and wheezing.   Cardiovascular: Negative for chest pain and leg swelling.  Psychiatric/Behavioral: Negative for decreased concentration. The patient is not nervous/anxious.     Immunization History  Administered Date(s) Administered  . Fluad Quad(high Dose 65+) 10/31/2018, 11/25/2019  . Influenza, High Dose Seasonal PF 12/23/2015, 11/29/2017  . Influenza,inj,quad, With Preservative 12/31/2016  . Influenza-Unspecified 11/30/2016  . PFIZER(Purple Top)SARS-COV-2 Vaccination 04/09/2019, 05/03/2019, 11/28/2019  . Pneumococcal Conjugate-13 07/06/2013  . Pneumococcal Polysaccharide-23 05/05/2004  . Tetanus 01/27/2002  . Zoster 09/16/2007   Pertinent  Health Maintenance Due  Topic Date Due  . DEXA SCAN  Never done  . INFLUENZA VACCINE  Completed  . PNA vac Low Risk Adult  Completed   Fall Risk  10/12/2019 07/18/2019 04/10/2019 02/03/2019 02/02/2019  Falls in the past year? 0 1 1 - 1  Number falls in past yr: 0 1 1 - 1  Injury with Fall? 0 1 0 - 0  Comment - Daughter states mom had blacked out and fell down. Patient daughter also states mom gets bruises from falls. - - -  Risk for fall due to : - -  - History of fall(s);Impaired balance/gait;Mental status change;Medication side effect History of fall(s);Impaired balance/gait  Follow up - - - Falls evaluation completed;Education provided;Falls prevention discussed -   Functional Status Survey:    Vitals:   04/05/20 1302  BP: 140/60  Pulse: 69  Resp: 20  Temp: 97.6 F (36.4 C)  TempSrc: Temporal  SpO2: 95%  Weight: 144 lb 12.8 oz (65.7 kg)  Height: 5' (1.524 m)   Body mass index is 28.28 kg/m. Physical Exam Vitals reviewed.  Constitutional:      General: She is not in acute distress. HENT:     Head: Normocephalic.     Left Ear: Decreased hearing noted. Swelling and tenderness present. No drainage.     Ears:      Comments: Unable to examine ear canal due to outer ear pain.  Cardiovascular:     Pulses: Normal pulses.     Heart sounds: Normal heart sounds.  Pulmonary:     Effort: Pulmonary effort is normal. No respiratory distress.     Breath sounds: Normal breath sounds. No wheezing.  Neurological:     Mental Status: She is alert.  Psychiatric:        Mood and Affect: Mood normal.        Behavior: Behavior normal.     Labs reviewed: Recent Labs    10/12/19 1214  NA 142  K 4.5  CL 103  CO2 30  GLUCOSE 124*  BUN 19  CREATININE 1.14*  CALCIUM 10.6*   Recent Labs    10/12/19 1214  AST 12  ALT 9  BILITOT 0.3  PROT 7.1   Recent Labs    10/12/19 1214  WBC 6.2  NEUTROABS 3,317  HGB 11.3*  HCT 34.9*  MCV 88.1  PLT 207   Lab Results  Component Value Date   TSH 2.62 10/12/2019   No results found for: HGBA1C Lab Results  Component Value Date   CHOL 179 10/12/2019   HDL 67 10/12/2019   LDLCALC 78 10/12/2019   TRIG 261 (H)  10/12/2019   CHOLHDL 2.7 10/12/2019    Significant Diagnostic Results in last 30 days:  No results found.  Assessment/Plan 1. Cellulitis of other specified site - erythema and crusting near concha portion of outer ear, pain with touch - suspect hearing aid portion  that touches this area cause of infection - will start doxycycline 100 mg po bid x 10 days - advised to replace hearing aid piece- daughter will contact audiologist - recommend cleaning hearing aid per audiologist directions - cleanse out ear with warm water daily and apply antibiotic ointment bid - contact PCP if infections worsens or does not feel better in a few days - doxycycline (VIBRA-TABS) 100 MG tablet; Take 1 tablet (100 mg total) by mouth 2 (two) times daily.  Dispense: 20 tablet; Refill: 0  2. Acute pain of left ear - same as above  I provided 22 minutes of face-to-face time during this encounter.     Family/ staff Communication: Plan discussed with patient and daughter  Labs/tests ordered: none

## 2020-04-05 NOTE — Patient Instructions (Signed)
Please keep hearing aide clean- seek advice from audiologist  May take extra dose of 650 mg tylenol daily   Please contact PCP if infection does not go away  Complete all antibiotics  Cleanse with warm water and apply antibiotic ointment daily     Otitis Externa  Otitis externa is an infection of the outer ear canal. The outer ear canal is the area between the outside of the ear and the eardrum. Otitis externa is sometimes called swimmer's ear. What are the causes? Common causes of this condition include:  Swimming in dirty water.  Moisture in the ear.  An injury to the inside of the ear.  An object stuck in the ear.  A cut or scrape on the outside of the ear. What increases the risk? You are more likely to develop this condition if you go swimming often. What are the signs or symptoms? The first symptom of this condition is often itching in the ear. Later symptoms of the condition include:  Swelling of the ear.  Redness in the ear.  Ear pain. The pain may get worse when you pull on your ear.  Pus coming from the ear. How is this diagnosed? This condition may be diagnosed by examining the ear and testing fluid from the ear for bacteria and funguses. How is this treated? This condition may be treated with:  Antibiotic ear drops. These are often given for 10-14 days.  Medicines to reduce itching and swelling. Follow these instructions at home:  If you were prescribed antibiotic ear drops, use them as told by your health care provider. Do not stop using the antibiotic even if your condition improves.  Take over-the-counter and prescription medicines only as told by your health care provider.  Avoid getting water in your ears as told by your health care provider. This may include avoiding swimming or water sports for a few days.  Keep all follow-up visits as told by your health care provider. This is important. How is this prevented?  Keep your ears dry. Use the  corner of a towel to dry your ears after you swim or bathe.  Avoid scratching or putting things in your ear. Doing these things can damage the ear canal or remove the protective wax that lines it, which makes it easier for bacteria and funguses to grow.  Avoid swimming in lakes, polluted water, or pools that may not have enough chlorine. Contact a health care provider if:  You have a fever.  Your ear is still red, swollen, painful, or draining pus after 3 days.  Your redness, swelling, or pain gets worse.  You have a severe headache.  You have redness, swelling, pain, or tenderness in the area behind your ear. Summary  Otitis externa is an infection of the outer ear canal.  Common causes include swimming in dirty water, moisture in the ear, or a cut or scrape in the ear.  Symptoms include pain, redness, and swelling of the ear.  If you were prescribed antibiotic ear drops, use them as told by your health care provider. Do not stop using the antibiotic even if your condition improves. This information is not intended to replace advice given to you by your health care provider. Make sure you discuss any questions you have with your health care provider. Document Revised: 07/23/2017 Document Reviewed: 07/23/2017 Elsevier Patient Education  2021 [employer].

## 2020-04-12 ENCOUNTER — Other Ambulatory Visit: Payer: Self-pay | Admitting: Internal Medicine

## 2020-04-12 DIAGNOSIS — I10 Essential (primary) hypertension: Secondary | ICD-10-CM

## 2020-04-12 NOTE — Telephone Encounter (Signed)
rx sent to pharmacy by e-script  

## 2020-04-15 ENCOUNTER — Other Ambulatory Visit: Payer: Self-pay | Admitting: Internal Medicine

## 2020-04-15 ENCOUNTER — Encounter: Payer: Self-pay | Admitting: Family

## 2020-04-15 ENCOUNTER — Ambulatory Visit (INDEPENDENT_AMBULATORY_CARE_PROVIDER_SITE_OTHER): Payer: Medicare Other | Admitting: Family

## 2020-04-15 ENCOUNTER — Other Ambulatory Visit: Payer: Self-pay | Admitting: Family

## 2020-04-15 ENCOUNTER — Other Ambulatory Visit: Payer: Self-pay

## 2020-04-15 VITALS — BP 100/60 | HR 70 | Temp 97.8°F | Resp 16 | Ht 60.0 in | Wt 147.0 lb

## 2020-04-15 DIAGNOSIS — L03818 Cellulitis of other sites: Secondary | ICD-10-CM

## 2020-04-15 DIAGNOSIS — F0391 Unspecified dementia with behavioral disturbance: Secondary | ICD-10-CM

## 2020-04-15 MED ORDER — DOXYCYCLINE HYCLATE 100 MG PO TABS: 100.0000 mg | ORAL_TABLET | Freq: Two times a day (BID) | ORAL | 0 refills | Status: DC

## 2020-04-15 MED ORDER — MUPIROCIN 2 % EX OINT: 1.0000 "application " | TOPICAL_OINTMENT | Freq: Three times a day (TID) | CUTANEOUS | 0 refills | Status: DC

## 2020-04-15 MED FILL — DOXYCYCLINE HYCLATE 100 MG: 100 | 7 days supply | Qty: 14 | Fill #0

## 2020-04-15 MED FILL — MUPIROCIN 2% OINTMENT: 2 | 7 days supply | Qty: 22 | Fill #0

## 2020-04-15 NOTE — Patient Instructions (Signed)
Cleanse outer left ear with saline or dial soap arms warm water,pat dry,apply Bactroban ointment three times ointment.Notify provider  For any   Redness,odor,drainage,fever or chills.

## 2020-04-15 NOTE — Progress Notes (Signed)
Provider: Dinah Ngetich FNP-C  Gayland Curry, DO  Patient Care Team: Gayland Curry, DO as PCP - General (Geriatric Medicine) Wonda Horner, MD as Consulting Physician (Gastroenterology) Gaynelle Arabian, MD as Consulting Physician (Orthopedic Surgery) Leonie Man, MD as Consulting Physician (Cardiology) Shon Hough, MD as Consulting Physician (Ophthalmology)  Extended Emergency Contact Information Primary Emergency Contact: Kendrick,[name] Address: [place], [place] [address] Johnnette Litter of [place] Phone: [phone] Mobile Phone: [phone] Relation: Daughter Secondary Emergency Contact: Gae Gallop [place] of [place] Phone: [phone] Mobile Phone: [phone] Relation: Daughter  Code Status:  Full Code  Goals of care: Advanced Directive information Advanced Directives 04/15/2020  Does Patient Have a Medical Advance Directive? Yes  Type of Paramedic of [place];Living will  Does patient want to make changes to medical advance directive? No - Patient declined  Copy of [place] in Chart? Yes - validated most recent copy scanned in chart (See row information)  Would patient like information on creating a medical advance directive? -  Pre-existing out of facility DNR order (yellow form or pink MOST form) -     Chief Complaint  Patient presents with  . Follow-up    Still having issues with ear. Completed antibiotic today.    HPI:  Pt is a 87 y.o. female seen today for an acute visit for evaluation of left ear cellulitis.she was seen here on 04/05/2020 by Arloa Koh was treated with 10 days course of Doxycycline twice daily. Has completed doxycycline.still red and tender.No fever or chills. No drainage or odor noted.son-in law states scab fell off today.[name] KitchenHer hearing aids has not been working well.Hearing was dropped to Audiologist today.Can hear without hearing  aid.Hearing aid seem to be causing pressure on left ear.spoke with patient daughter on the phone suggest treating with Keflex but patient is allergic to ceftin.discussed with her need for broad spectrum coverage. Infection seemed to have responded well to Doxycycline.    Past Medical History:  Diagnosis Date  . Breast cancer ([place]) 1997   right - tx with lumpectomty & radiation  . Burning sensation of feet    Per New Patient Packet-PSC   . Chronic anxiety   . Chronic kidney disease    Per New Patient Packet-PSC   . Cognitive impairment    Per New Patient Packet-PSC   . Diverticulosis   . Dyslipidemia   . Fluid retention    Per New Patient Packet-PSC   . Gait difficulty   . GERD (gastroesophageal reflux disease)    RARE - NO MEDICATIONS  . Hearing impairment    BILATERAL HEARING AIDS  . Heart murmur    Per New Patient Packet-PSC   . High cholesterol    Per New Patient Packet-PSC   . History of nuclear stress test 11/2009   dipyridamole; no evidence of ischemia, normal, low risk   . History of shingles 1998   NO RESIDUAL PROBLEMS  . Hypertension    SEES CARDIOLOGIST DR. DAVID HARDING FOR HYPERTENSION   . Insomnia   . Lymphedema of arm    RIGHT   . Mitral and aortic regurgitation   . OA (osteoarthritis)    OA AND PAIN LEFT KNEE  . Osteopenia   . Scoliosis    Per New Patient Packet-PSC   . Shortness of breath    WHEN CLIMBING STEPS AT MY HOME--OTHERWISE OK  . Sinus problem   .  Sleep difficulties    ALWAYS HAD SLEEPING PROBLEMS - MEDICATION HELPS PT SLEEP ABOUT 3 HOURS  . SOB (shortness of breath)    Per New Patient Packet-PSC, hospitalized 2018    Past Surgical History:  Procedure Laterality Date  . ABDOMINAL HYSTERECTOMY  1973  . APPENDECTOMY  1951  . BREAST ADENOMA  1950   EXCISED  . BREAST LUMPECTOMY Right 1997  . COLONOSCOPY  2014   Dr.Sam Penelope Coop, Per New Patient Packet-PSC   . EYE SURGERY     BILATERAL CATARACT EXTRACTIONS AND LENS IMPLANTS  . NM MYOVIEW  LTD  08/2016   Hyperdynamic LV function EF 65%. No ischemia or infarction. LOW RISK  . REPLACEMENT TOTAL KNEE  2002  . ROTATOR CUFF REPAIR Right 12/2009   Dr. Lenna Sciara. [place]   . TOTAL KNEE ARTHROPLASTY Left 05/01/2013   Procedure: LEFT TOTAL KNEE ARTHROPLASTY;  Surgeon: Gearlean Alf, MD;  Location: [facility];  Service: Orthopedics;  Laterality: Left;  . TRANSTHORACIC ECHOCARDIOGRAM  05/1994   trace mitral/tricuspic/aortic inusff; hyperkinetic LV  . TRANSTHORACIC ECHOCARDIOGRAM  08/2016   Normal LV size. EF 65-70%. GR 1 DD. (Normal for age). Mild aortic stenosis    Allergies  Allergen Reactions  . Benadryl [Diphenhydramine Hcl (Sleep)]     Makes me hyper   . Cardizem [Diltiazem Hcl]     Doesn't remember. Dr Rex Kras told patient not to take.   [name] Kitchen Ceftin [Cefuroxime Axetil] Other (See Comments)    Stomach Issues   . Epinephrine Other (See Comments)    unknown  . Oxycodone Hcl     Loss of appetite: sores on tongue and mouth  . Tetanus Toxoids Other (See Comments)    Unknown   . Tramadol Hcl     Loss of appetite: dry mouth/sores on tongue and mouth  . Vistaril [Hydroxyzine] Other (See Comments)    unknown  . Vytorin [Ezetimibe-Simvastatin]     Not comfortable taking it. Doesn't remember reaction.     Outpatient Encounter Medications as of 04/15/2020  Medication Sig  . acetaminophen (TYLENOL) 650 MG CR tablet Take 1,300 mg by mouth 2 (two) times daily.  [name] Kitchen amLODipine-benazepril (LOTREL) 10-20 MG capsule Take 1 capsule by mouth daily.  . bisoprolol (ZEBETA) 5 MG tablet TAKE 1 TABLET DAILY  . cyanocobalamin 1000 MCG tablet Take 1,000 mcg by mouth daily.  . diclofenac Sodium (VOLTAREN) 1 % GEL Apply 2 g topically 4 (four) times daily as needed.  . donepezil (ARICEPT) 5 MG tablet Take 1 tablet (5 mg total) by mouth at bedtime. For memory loss  . furosemide (LASIX) 40 MG tablet TAKE 40 MG BY MOUTH DAILY AND MAY TAKE AN EXTRA 20 MG ( 1/2 TABLET) IF NEEDED FOR PUFFINESS  . gabapentin  (NEURONTIN) 100 MG capsule Take 3 capsules every night  . iron polysaccharides (NU-IRON) 150 MG capsule Take 1 capsule (150 mg total) by mouth daily. WITH FOOD  . Liniments (SALONPAS EX) Apply 1 application topically as needed (back pain).   . Melatonin 5 MG CAPS Take 5 mg by mouth daily after supper.  . Menthol, Topical Analgesic, 4 % GEL Apply 1 application topically 3 (three) times daily as needed. Massage into right arm three times a day for pain.  . mirtazapine (REMERON) 7.5 MG tablet Take 1 tablet (7.5 mg total) by mouth at bedtime.  . simvastatin (ZOCOR) 20 MG tablet Take 1 tablet (20 mg total) by mouth at bedtime.  . [DISCONTINUED] doxycycline (VIBRA-TABS) 100 MG tablet Take 1  tablet (100 mg total) by mouth 2 (two) times daily.   No facility-administered encounter medications on file as of 04/15/2020.    Review of Systems  Constitutional: Negative for appetite change, chills, fatigue and fever.  HENT: Positive for ear pain and hearing loss. Negative for congestion, ear discharge, rhinorrhea, sinus pressure, sinus pain, sneezing and sore throat.   Eyes: Negative for pain, discharge and itching.  Respiratory: Negative for cough, chest tightness, shortness of breath and wheezing.   Cardiovascular: Negative for chest pain, palpitations and leg swelling.    Immunization History  Administered Date(s) Administered  . Fluad Quad(high Dose 65+) 10/31/2018, 11/25/2019  . Influenza, High Dose Seasonal PF 12/23/2015, 11/29/2017  . Influenza,inj,quad, With Preservative 12/31/2016  . Influenza-Unspecified 11/30/2016  . PFIZER(Purple Top)SARS-COV-2 Vaccination 04/09/2019, 05/03/2019, 11/28/2019  . Pneumococcal Conjugate-13 07/06/2013  . Pneumococcal Polysaccharide-23 05/05/2004  . Tetanus 01/27/2002  . Zoster 09/16/2007   Pertinent  Health Maintenance Due  Topic Date Due  . DEXA SCAN  Never done  . INFLUENZA VACCINE  Completed  . PNA vac Low Risk Adult  Completed   Fall Risk  04/15/2020  10/12/2019 07/18/2019 04/10/2019 02/03/2019  Falls in the past year? 0 0 1 1 -  Number falls in past yr: 0 0 1 1 -  Injury with Fall? 0 0 1 0 -  Comment - - Daughter states mom had blacked out and fell down. Patient daughter also states mom gets bruises from falls. - -  Risk for fall due to : - - - - History of fall(s);Impaired balance/gait;Mental status change;Medication side effect  Follow up - - - - Falls evaluation completed;Education provided;Falls prevention discussed   Functional Status Survey:    Vitals:   04/15/20 1007  BP: 100/60  Pulse: 70  Resp: 16  Temp: 97.8 F (36.6 C)  SpO2: 95%  Weight: 147 lb (66.7 kg)  Height: 5' (1.524 m)   Body mass index is 28.71 kg/m. Physical Exam Vitals reviewed.  Constitutional:      General: She is not in acute distress.    Appearance: She is overweight. She is not ill-appearing.  HENT:     Head: Normocephalic.     Right Ear: Tympanic membrane, ear canal and external ear normal. Decreased hearing noted. No drainage. There is no impacted cerumen.     Left Ear: Tympanic membrane and ear canal normal. Decreased hearing noted. No drainage. There is no impacted cerumen.     Ears:   Eyes:     General: No scleral icterus.       Right eye: No discharge.        Left eye: No discharge.     Extraocular Movements: Extraocular movements intact.     Conjunctiva/sclera: Conjunctivae normal.     Pupils: Pupils are equal, round, and reactive to light.  Cardiovascular:     Rate and Rhythm: Normal rate and regular rhythm.     Pulses: Normal pulses.     Heart sounds: Normal heart sounds. No murmur heard. No friction rub. No gallop.   Pulmonary:     Effort: Pulmonary effort is normal. No respiratory distress.     Breath sounds: Normal breath sounds. No wheezing, rhonchi or rales.  Chest:     Chest wall: No tenderness.  Skin:    General: Skin is warm and dry.     Coloration: Skin is not pale.     Findings: No bruising, erythema or rash.   Neurological:     Mental Status:  She is alert.     Labs reviewed: Recent Labs    10/12/19 1214  NA 142  K 4.5  CL 103  CO2 30  GLUCOSE 124*  BUN 19  CREATININE 1.14*  CALCIUM 10.6*   Recent Labs    10/12/19 1214  AST 12  ALT 9  BILITOT 0.3  PROT 7.1   Recent Labs    10/12/19 1214  WBC 6.2  NEUTROABS 3,317  HGB 11.3*  HCT 34.9*  MCV 88.1  PLT 207   Lab Results  Component Value Date   TSH 2.62 10/12/2019   No results found for: HGBA1C Lab Results  Component Value Date   CHOL 179 10/12/2019   HDL 67 10/12/2019   LDLCALC 78 10/12/2019   TRIG 261 (H) 10/12/2019   CHOLHDL 2.7 10/12/2019    Significant Diagnostic Results in last 30 days:  No results found.  Assessment/Plan   Cellulitis of other specified site Afebrile. - left ear small area former scab site with slight erythema and tender to touch.no drainage or odor.suspect possible pressure from hearing aid. - Advised to cleanse hearing aids with alcohol wipe  - Avoid touching infected areas before washing hands to prevent re-infection. - Cleanse outer left ear with saline or dial soap, warm water,pat dry,apply Bactroban ointment three times ointment daily until healed.Notify provider for any Redness,odor,drainage,fever or chills.  - mupirocin ointment (BACTROBAN) 2 %; Apply 1 application topically 3 (three) times daily. Apply to left ear  Dispense: 22 g; Refill: 0 - doxycycline (VIBRA-TABS) 100 MG tablet; Take 1 tablet (100 mg total) by mouth 2 (two) times daily for 7 days.  Dispense: 14 tablet; Refill: 0  Family/ staff Communication: Reviewed plan of care with patient,daughter on the phone and son in law present during visit.   Labs/tests ordered: None   Next Appointment: As needed if symptoms worsen or fail to improve.   Sandrea Hughs, NP

## 2020-04-17 ENCOUNTER — Other Ambulatory Visit: Payer: Self-pay | Admitting: Internal Medicine

## 2020-04-17 DIAGNOSIS — G629 Polyneuropathy, unspecified: Secondary | ICD-10-CM

## 2020-04-17 DIAGNOSIS — F0391 Unspecified dementia with behavioral disturbance: Secondary | ICD-10-CM

## 2020-04-22 ENCOUNTER — Encounter: Payer: Self-pay | Admitting: Internal Medicine

## 2020-05-06 ENCOUNTER — Encounter: Payer: Self-pay | Admitting: Internal Medicine

## 2020-05-06 ENCOUNTER — Other Ambulatory Visit: Payer: Self-pay

## 2020-05-06 ENCOUNTER — Ambulatory Visit (INDEPENDENT_AMBULATORY_CARE_PROVIDER_SITE_OTHER): Payer: Medicare Other | Admitting: Internal Medicine

## 2020-05-06 VITALS — BP 120/80 | HR 61 | Temp 97.8°F | Ht 60.0 in | Wt 137.2 lb

## 2020-05-06 DIAGNOSIS — M545 Low back pain, unspecified: Secondary | ICD-10-CM

## 2020-05-06 DIAGNOSIS — H9202 Otalgia, left ear: Secondary | ICD-10-CM

## 2020-05-06 DIAGNOSIS — I10 Essential (primary) hypertension: Secondary | ICD-10-CM | POA: Diagnosis not present

## 2020-05-06 DIAGNOSIS — I5032 Chronic diastolic (congestive) heart failure: Secondary | ICD-10-CM | POA: Diagnosis not present

## 2020-05-06 DIAGNOSIS — G8929 Other chronic pain: Secondary | ICD-10-CM

## 2020-05-06 DIAGNOSIS — F0391 Unspecified dementia with behavioral disturbance: Secondary | ICD-10-CM | POA: Diagnosis not present

## 2020-05-06 DIAGNOSIS — R5381 Other malaise: Secondary | ICD-10-CM

## 2020-05-06 LAB — CBC WITH DIFFERENTIAL/PLATELET
Absolute Monocytes: 713 cells/uL (ref 200–950)
Basophils Absolute: 44 cells/uL (ref 0–200)
Basophils Relative: 0.5 %
Eosinophils Absolute: 194 cells/uL (ref 15–500)
Eosinophils Relative: 2.2 %
HCT: 31 % — ABNORMAL LOW (ref 35.0–45.0)
Hemoglobin: 10.3 g/dL — ABNORMAL LOW (ref 11.7–15.5)
Lymphs Abs: 2288 cells/uL (ref 850–3900)
MCH: 29.2 pg (ref 27.0–33.0)
MCHC: 33.2 g/dL (ref 32.0–36.0)
MCV: 87.8 fL (ref 80.0–100.0)
MPV: 10.2 fL (ref 7.5–12.5)
Monocytes Relative: 8.1 %
Neutro Abs: 5562 cells/uL (ref 1500–7800)
Neutrophils Relative %: 63.2 %
Platelets: 326 10*3/uL (ref 140–400)
RBC: 3.53 10*6/uL — ABNORMAL LOW (ref 3.80–5.10)
RDW: 13.1 % (ref 11.0–15.0)
Total Lymphocyte: 26 %
WBC: 8.8 10*3/uL (ref 3.8–10.8)

## 2020-05-06 LAB — COMPLETE METABOLIC PANEL WITH GFR
AG Ratio: 1.7 (calc) (ref 1.0–2.5)
ALT: 8 U/L (ref 6–29)
AST: 13 U/L (ref 10–35)
Albumin: 4.3 g/dL (ref 3.6–5.1)
Alkaline phosphatase (APISO): 54 U/L (ref 37–153)
BUN/Creatinine Ratio: 16 (calc) (ref 6–22)
BUN: 18 mg/dL (ref 7–25)
CO2: 31 mmol/L (ref 20–32)
Calcium: 10.1 mg/dL (ref 8.6–10.4)
Chloride: 104 mmol/L (ref 98–110)
Creat: 1.11 mg/dL — ABNORMAL HIGH (ref 0.60–0.88)
GFR, Est African American: 50 mL/min/{1.73_m2} — ABNORMAL LOW (ref >=60)
GFR, Est Non African American: 43 mL/min/{1.73_m2} — ABNORMAL LOW (ref >=60)
Globulin: 2.5 g/dL (calc) (ref 1.9–3.7)
Glucose, Bld: 111 mg/dL (ref 65–139)
Potassium: 3.4 mmol/L — ABNORMAL LOW (ref 3.5–5.3)
Sodium: 145 mmol/L (ref 135–146)
Total Bilirubin: 0.2 mg/dL (ref 0.2–1.2)
Total Protein: 6.8 g/dL (ref 6.1–8.1)

## 2020-05-06 MED ORDER — MELOXICAM 7.5 MG PO TABS: 7.5000 mg | ORAL_TABLET | Freq: Every day | ORAL | 0 refills | Status: DC | PRN

## 2020-05-06 NOTE — Patient Instructions (Signed)
[facility]   

## 2020-05-06 NOTE — Progress Notes (Addendum)
Location:  [facility] clinic Provider:  Sonnia Strong L. [name], D.O., C.M.D.  Goals of Care:  Advanced Directives 05/06/2020  Does Patient Have a Medical Advance Directive? Yes  Type of Advance Directive [place]  Does patient want to make changes to medical advance directive? -  Copy of [place] in Chart? No - copy requested  Would patient like information on creating a medical advance directive? -  Pre-existing out of facility DNR order (yellow form or pink MOST form) -   Chief Complaint  Patient presents with  . Medical Management of Chronic Issues    4 month follow up. Patient has been struggling with breathing and back pain today. Patient states that she is "tired". Patient has poor appetite. Would like handicap placard and transfer chair. Patient has sore on left ear that has not quite healed and would like it looked at.  . Health Maintenance    Tetanus/Tdap, Dexa scan    HPI: Patient is a 87 y.o. female seen today for medical management of chronic diseases.    She is here now with [name].  Things went amiss and mother's money got taken away by her other daughter Con Memos)  she temporarily stayed in [place].  [name] is now the POA.  All of this has affected [name] stress wise and with memory.  Bothers her sometimes more than others.    She is not having a good day.  She's had a bad day.  Feels a little better since sitting down and relaxing.  She's home at home by herself a lot.  Has to do a lot on her own at home.    She's been short of breath the past two weeks.  She had an extra lasix.  Pollen is back out and she may need to restart her antihistamines.    Left ear had cellulitis.  It's still sore inside the ear.  Has to wear it to hear anything.  Needs right hearing aid, too.    Back pain is bothering her.  It's worse when she's trying to get started and when she's doing something.  They want to try mobic PRN.    Past Medical History:  Diagnosis  Date  . Breast cancer ([place]) 1997   right - tx with lumpectomty & radiation  . Burning sensation of feet    Per New Patient Packet-PSC   . Chronic anxiety   . Chronic kidney disease    Per New Patient Packet-PSC   . Cognitive impairment    Per New Patient Packet-PSC   . Diverticulosis   . Dyslipidemia   . Fluid retention    Per New Patient Packet-PSC   . Gait difficulty   . GERD (gastroesophageal reflux disease)    RARE - NO MEDICATIONS  . Hearing impairment    BILATERAL HEARING AIDS  . Heart murmur    Per New Patient Packet-PSC   . High cholesterol    Per New Patient Packet-PSC   . History of nuclear stress test 11/2009   dipyridamole; no evidence of ischemia, normal, low risk   . History of shingles 1998   NO RESIDUAL PROBLEMS  . Hypertension    SEES CARDIOLOGIST DR. DAVID HARDING FOR HYPERTENSION   . Insomnia   . Lymphedema of arm    RIGHT   . Mitral and aortic regurgitation   . OA (osteoarthritis)    OA AND PAIN LEFT KNEE  . Osteopenia   . Scoliosis  Per New Patient Packet-PSC   . Shortness of breath    WHEN CLIMBING STEPS AT MY HOME--OTHERWISE OK  . Sinus problem   . Sleep difficulties    ALWAYS HAD SLEEPING PROBLEMS - MEDICATION HELPS PT SLEEP ABOUT 3 HOURS  . SOB (shortness of breath)    Per New Patient Packet-PSC, hospitalized 2018     Past Surgical History:  Procedure Laterality Date  . ABDOMINAL HYSTERECTOMY  1973  . APPENDECTOMY  1951  . BREAST ADENOMA  1950   EXCISED  . BREAST LUMPECTOMY Right 1997  . COLONOSCOPY  2014   Dr.Sam Penelope Coop, Per New Patient Packet-PSC   . EYE SURGERY     BILATERAL CATARACT EXTRACTIONS AND LENS IMPLANTS  . NM MYOVIEW LTD  08/2016   Hyperdynamic LV function EF 65%. No ischemia or infarction. LOW RISK  . REPLACEMENT TOTAL KNEE  2002  . ROTATOR CUFF REPAIR Right 12/2009   Dr. Lenna Sciara. [place]   . TOTAL KNEE ARTHROPLASTY Left 05/01/2013   Procedure: LEFT TOTAL KNEE ARTHROPLASTY;  Surgeon: Gearlean Alf, MD;  Location: [facility];  Service: Orthopedics;  Laterality: Left;  . TRANSTHORACIC ECHOCARDIOGRAM  05/1994   trace mitral/tricuspic/aortic inusff; hyperkinetic LV  . TRANSTHORACIC ECHOCARDIOGRAM  08/2016   Normal LV size. EF 65-70%. GR 1 DD. (Normal for age). Mild aortic stenosis    Allergies  Allergen Reactions  . Benadryl [Diphenhydramine Hcl (Sleep)]     Makes me hyper   . Cardizem [Diltiazem Hcl]     Doesn't remember. Dr Rex Kras told patient not to take.   [name] Kitchen Ceftin [Cefuroxime Axetil] Other (See Comments)    Stomach Issues   . Epinephrine Other (See Comments)    unknown  . Oxycodone Hcl     Loss of appetite: sores on tongue and mouth  . Tetanus Toxoids Other (See Comments)    Unknown   . Tramadol Hcl     Loss of appetite: dry mouth/sores on tongue and mouth  . Vistaril [Hydroxyzine] Other (See Comments)    unknown  . Vytorin [Ezetimibe-Simvastatin]     Not comfortable taking it. Doesn't remember reaction.     Outpatient Encounter Medications as of 05/06/2020  Medication Sig  . acetaminophen (TYLENOL) 650 MG CR tablet Take 1,300 mg by mouth 2 (two) times daily.  [name] Kitchen amLODipine-benazepril (LOTREL) 10-20 MG capsule Take 1 capsule by mouth daily.  . bisoprolol (ZEBETA) 5 MG tablet TAKE 1 TABLET DAILY  . diclofenac Sodium (VOLTAREN) 1 % GEL Apply 2 g topically 4 (four) times daily as needed.  . donepezil (ARICEPT) 5 MG tablet TAKE 1 TABLET AT BEDTIME FOR MEMORY LOSS  . furosemide (LASIX) 40 MG tablet TAKE 40 MG BY MOUTH DAILY AND MAY TAKE AN EXTRA 20 MG ( 1/2 TABLET) IF NEEDED FOR PUFFINESS  . gabapentin (NEURONTIN) 100 MG capsule TAKE 3 CAPSULES EVERY NIGHT  . iron polysaccharides (NU-IRON) 150 MG capsule Take 1 capsule (150 mg total) by mouth daily. WITH FOOD  . Liniments (SALONPAS EX) Apply 1 application topically as needed (back pain).   . Melatonin 5 MG CAPS Take 5 mg by mouth daily after supper.  . Menthol, Topical Analgesic, 4 % GEL Apply 1 application topically 3 (three) times daily as needed.  Massage into right arm three times a day for pain.  . mirtazapine (REMERON) 7.5 MG tablet Take 1 tablet (7.5 mg total) by mouth at bedtime.  . simvastatin (ZOCOR) 20 MG tablet Take 1 tablet (20 mg total) by mouth at  bedtime.  . [DISCONTINUED] cyanocobalamin 1000 MCG tablet Take 1,000 mcg by mouth daily.  . [DISCONTINUED] mupirocin ointment (BACTROBAN) 2 % Apply 1 application topically 3 (three) times daily. Apply to left ear   No facility-administered encounter medications on file as of 05/06/2020.    Review of Systems:  Review of Systems  Constitutional: Negative for chills and fever.  HENT: Positive for ear pain and hearing loss. Negative for congestion and sore throat.   Eyes: Negative for blurred vision.  Respiratory: Positive for shortness of breath. Negative for cough, sputum production and wheezing.   Cardiovascular: Negative for chest pain, palpitations and leg swelling.  Gastrointestinal: Negative for abdominal pain, blood in stool, constipation and melena.  Genitourinary: Negative for dysuria.  Musculoskeletal: Negative for falls and joint pain.  Neurological: Negative for dizziness and loss of consciousness.  Endo/Heme/Allergies: Bruises/bleeds easily.  Psychiatric/Behavioral: Positive for memory loss. Negative for depression. The patient is not nervous/anxious and does not have insomnia.     Health Maintenance  Topic Date Due  . DEXA SCAN  Never done  . TETANUS/TDAP  01/28/2012  . INFLUENZA VACCINE  Completed  . COVID-19 Vaccine  Completed  . PNA vac Low Risk Adult  Completed  . HPV VACCINES  Aged Out    Physical Exam: Vitals:   05/06/20 1028  BP: 120/80  Pulse: 61  Temp: 97.8 F (36.6 C)  SpO2: 94%  Weight: 137 lb 3.2 oz (62.2 kg)  Height: 5' (1.524 m)   Body mass index is 26.8 kg/m. Physical Exam Vitals reviewed.  Constitutional:      General: She is not in acute distress.    Appearance: Normal appearance. She is not toxic-appearing.  HENT:     Head:  Normocephalic and atraumatic.  Eyes:     Extraocular Movements: Extraocular movements intact.     Conjunctiva/sclera: Conjunctivae normal.     Pupils: Pupils are equal, round, and reactive to light.  Cardiovascular:     Rate and Rhythm: Normal rate and regular rhythm.  Pulmonary:     Effort: Pulmonary effort is normal.     Breath sounds: Normal breath sounds.  Abdominal:     General: Bowel sounds are normal.  Musculoskeletal:        General: Normal range of motion.     Right lower leg: No edema.     Left lower leg: No edema.  Neurological:     General: No focal deficit present.     Mental Status: She is alert and oriented to person, place, and time.     Gait: Gait abnormal.     Comments: Uses rollator walker  Psychiatric:        Mood and Affect: Mood normal.        Behavior: Behavior normal.     Labs reviewed: Basic Metabolic Panel: Recent Labs    10/12/19 1214  NA 142  K 4.5  CL 103  CO2 30  GLUCOSE 124*  BUN 19  CREATININE 1.14*  CALCIUM 10.6*  TSH 2.62   Liver Function Tests: Recent Labs    10/12/19 1214  AST 12  ALT 9  BILITOT 0.3  PROT 7.1   No results for input(s): LIPASE, AMYLASE in the last 8760 hours. No results for input(s): AMMONIA in the last 8760 hours. CBC: Recent Labs    10/12/19 1214  WBC 6.2  NEUTROABS 3,317  HGB 11.3*  HCT 34.9*  MCV 88.1  PLT 207   Lipid Panel: Recent Labs  10/12/19 1214  CHOL 179  HDL 67  LDLCALC 78  TRIG 261*  CHOLHDL 2.7    Assessment/Plan 1. Dementia with behavioral disturbance, unspecified dementia type ([place]) -progressing gradually, she lives alone, but her daughter locally who is a long-term care pharmacist is now quite involved and makes sure her meds are set up and she has what she needs - Ambulatory referral to [place]  2. Diastolic dysfunction with chronic heart failure (HCC) - no signs of acute chf on exam--no rales, edema so suspect her dyspnea is primarily due to deconditioning--she  apparently had several weeks of alone time before [name] was able to figure out what happened with her sister, etc - DME Wheelchair manual - CBC with Differential/Platelet - COMPLETE METABOLIC PANEL WITH GFR - Ambulatory referral to Home Health  3. Essential hypertension -bp controlled, cont same regimen and monitor - Ambulatory referral to [place]  4. Chronic midline low back pain without sciatica - has periods where this is worse - may use mobic prn but recommend no more than 3 times per week - meloxicam (MOBIC) 7.5 MG tablet; Take 1 tablet (7.5 mg total) by mouth daily as needed for pain.  Dispense: 90 tablet; Refill: 0 - DME Wheelchair manual was prescribed today--transport chair to allow her to be transported by her daughter, [name] , who is ready and willing to assist so she can attend appts and get out of her home -she cannot use a standard wheelchair on her own due to its heavy weight and large size vs a transport chair - Ambulatory referral to [place]  5. Physical deconditioning - ordered PT, OT for her at home to get her moving better and hopefully in less discomfort again - meloxicam (MOBIC) 7.5 MG tablet; Take 1 tablet (7.5 mg total) by mouth daily as needed for pain.  Dispense: 90 tablet; Refill: 0 - Ambulatory referral to Home Health  6. Left ear pain -take left hearing aid out at home -her daughter has made an appt for audiology to use right hearing aide  Labs/tests ordered:   Orders Placed This Encounter  Procedures  . DME Wheelchair manual    Patient suffers from low back pain which impairs their ability to perform daily activities like bathing, dressing and grooming in the home.  A rollator walker will not resolve issue with performing activities of daily living. A wheelchair will allow patient to safely perform daily activities. Patient can safely propel the wheelchair in the home or has a caregiver who can provide assistance. Length of need 99 Accessories:  elevating leg rests (ELRs), wheel locks, extensions and anti-tippers.  TRANSPORT WHEELCHAIR  . CBC with Differential/Platelet  . COMPLETE METABOLIC PANEL WITH GFR  . Ambulatory referral to Home Health    Referral Priority:   Routine    Referral Type:   Home Health Care    Referral Reason:   Specialty Services Required    Requested Specialty:   [place]    Number of Visits Requested:   1   Next appt:  4 mos med mgt  52 mins spent with pt and her daughter today  Reyaansh Merlo L. Gaspar Fowle, D.O. [facility] [address], [place] [address] Cell Phone (Mon-Fri 8am-5pm):  [phone] On Call:  [phone] & follow prompts after 5pm & weekends Office Phone:  [phone] Office Fax:  [phone]

## 2020-05-07 ENCOUNTER — Telehealth: Payer: Self-pay

## 2020-05-07 ENCOUNTER — Other Ambulatory Visit: Payer: Self-pay | Admitting: *Deleted

## 2020-05-07 MED ORDER — POTASSIUM CHLORIDE CRYS ER 20 MEQ PO TBCR: 20.0000 meq | EXTENDED_RELEASE_TABLET | ORAL | 3 refills | Status: DC | PRN

## 2020-05-07 NOTE — Telephone Encounter (Signed)
-----   Message from Gayland Curry, DO sent at 05/07/2020  9:38 AM EST ----- Please notify [name] Morton (though she also uses mychart app) Anemia has worsened.  Her med list includes nu-iron but I wonder if she was getting it (I know she had a period of time where she probably didn't have any of her medicine due to social issues but it looks like 30 days worth were sent in October of last year and no more).  I recommend she resume the nu-iron--if she cannot take it daily, at least every other day. Potassium was a little low likely b/c she'd had a lasix tablet.  Let's send in potassium chloride 45meq to take only if she takes a lasix (so daily prn lasix use).

## 2020-05-07 NOTE — Telephone Encounter (Signed)
Incoming call received from patients daughter requesting that we contact [facility] as they will not release mobic to patient due to diagnosis of heart failure. Per Randie Heinz is contraindicated for patients with heart failure, which is why Dr.Reed only authorized  That she take 3 days a week. [facility] told [name] that they have tried to contact us since yesterday to get authorization.  I reviewed Dr.Reed's not from yesterday and she is aware of heart failure diagnosis.   I checked incoming faxes and we did received a drug utilization clarification request from [facility] today. I called [facility] at [phone], invoice number [phone] to discus with the clarification team. After a 13 min hold and several transfers, I spoke with Shanon Brow (Pharmacist) to provide clarification.   A follow-up call was provided to patients daughter informing her request complete.

## 2020-05-07 NOTE — Progress Notes (Signed)
Please notify [name] Morton (though she also uses mychart app) Anemia has worsened.  Her med list includes nu-iron but I wonder if she was getting it (I know she had a period of time where she probably didn't have any of her medicine due to social issues but it looks like 30 days worth were sent in October of last year and no more).  I recommend she resume the nu-iron--if she cannot take it daily, at least every other day. Potassium was a little low likely b/c she'd had a lasix tablet.  Let's send in potassium chloride 19meq to take only if she takes a lasix (so daily prn lasix use).

## 2020-05-07 NOTE — Telephone Encounter (Signed)
[name] Morton, daughter Notified and agreed with lab. Patient has not been taking the NuIron. Will restart. Will call in Potassium to [facility] per daughter request.

## 2020-05-15 ENCOUNTER — Telehealth: Payer: Self-pay

## 2020-05-15 ENCOUNTER — Other Ambulatory Visit: Payer: Self-pay | Admitting: Nurse Practitioner

## 2020-05-15 NOTE — Telephone Encounter (Signed)
Family requested transport wheelchair

## 2020-05-15 NOTE — Telephone Encounter (Signed)
[name] Morton with [facility] called needing clarification on wheelchair order. She needs to know if order is suppose to be for regular manual wheelchair or a transport wheelchair. Please advise.  Message routed to Hollace Kinnier, DO

## 2020-05-16 ENCOUNTER — Telehealth: Payer: Self-pay

## 2020-05-16 NOTE — Telephone Encounter (Signed)
Spoke with [name] at [employer] and she stated that the attachments requested are only for standard wheelchair. The patient cannot navigate the transport wheelchair themselves,it is just for someone to push them in. I guess order will need to be amended. I will also route this to Anita May, CMA clinical intake since I am at [facility] today

## 2020-05-16 NOTE — Telephone Encounter (Signed)
Pt's daughter has requested TRANSPORT wheelchair to bring patient to appts and out and about.

## 2020-05-16 NOTE — Telephone Encounter (Signed)
Spoke with [name] Morton at [facility] and she will make note and send to [facility] for the [employer].

## 2020-05-16 NOTE — Telephone Encounter (Signed)
Spoke with Abigail Butts at [employer] and was tole that oreder for transport chair needs to be rewritten with the information stating that patient has a caregiver and transport chair for outside use. She stated that [place] feels it should only be used for indoor use.

## 2020-05-24 DIAGNOSIS — I890 Lymphedema, not elsewhere classified: Secondary | ICD-10-CM | POA: Diagnosis not present

## 2020-05-24 DIAGNOSIS — G4700 Insomnia, unspecified: Secondary | ICD-10-CM | POA: Diagnosis not present

## 2020-05-24 DIAGNOSIS — I080 Rheumatic disorders of both mitral and aortic valves: Secondary | ICD-10-CM | POA: Diagnosis not present

## 2020-05-24 DIAGNOSIS — F419 Anxiety disorder, unspecified: Secondary | ICD-10-CM | POA: Diagnosis not present

## 2020-05-24 DIAGNOSIS — Z9181 History of falling: Secondary | ICD-10-CM | POA: Diagnosis not present

## 2020-05-24 DIAGNOSIS — K529 Noninfective gastroenteritis and colitis, unspecified: Secondary | ICD-10-CM | POA: Diagnosis not present

## 2020-05-24 DIAGNOSIS — M419 Scoliosis, unspecified: Secondary | ICD-10-CM | POA: Diagnosis not present

## 2020-05-24 DIAGNOSIS — D62 Acute posthemorrhagic anemia: Secondary | ICD-10-CM | POA: Diagnosis not present

## 2020-05-24 DIAGNOSIS — F32A Depression, unspecified: Secondary | ICD-10-CM | POA: Diagnosis not present

## 2020-05-24 DIAGNOSIS — Z96652 Presence of left artificial knee joint: Secondary | ICD-10-CM | POA: Diagnosis not present

## 2020-05-24 DIAGNOSIS — G8929 Other chronic pain: Secondary | ICD-10-CM | POA: Diagnosis not present

## 2020-05-24 DIAGNOSIS — I130 Hypertensive heart and chronic kidney disease with heart failure and stage 1 through stage 4 chronic kidney disease, or unspecified chronic kidney disease: Secondary | ICD-10-CM | POA: Diagnosis not present

## 2020-05-24 DIAGNOSIS — N189 Chronic kidney disease, unspecified: Secondary | ICD-10-CM | POA: Diagnosis not present

## 2020-05-24 DIAGNOSIS — F0391 Unspecified dementia with behavioral disturbance: Secondary | ICD-10-CM | POA: Diagnosis not present

## 2020-05-24 DIAGNOSIS — Z853 Personal history of malignant neoplasm of breast: Secondary | ICD-10-CM | POA: Diagnosis not present

## 2020-05-24 DIAGNOSIS — I5032 Chronic diastolic (congestive) heart failure: Secondary | ICD-10-CM | POA: Diagnosis not present

## 2020-05-24 DIAGNOSIS — E785 Hyperlipidemia, unspecified: Secondary | ICD-10-CM | POA: Diagnosis not present

## 2020-05-24 DIAGNOSIS — K5730 Diverticulosis of large intestine without perforation or abscess without bleeding: Secondary | ICD-10-CM | POA: Diagnosis not present

## 2020-05-24 DIAGNOSIS — H903 Sensorineural hearing loss, bilateral: Secondary | ICD-10-CM | POA: Diagnosis not present

## 2020-05-24 DIAGNOSIS — E441 Mild protein-calorie malnutrition: Secondary | ICD-10-CM | POA: Diagnosis not present

## 2020-05-24 DIAGNOSIS — M8580 Other specified disorders of bone density and structure, unspecified site: Secondary | ICD-10-CM | POA: Diagnosis not present

## 2020-05-24 DIAGNOSIS — G629 Polyneuropathy, unspecified: Secondary | ICD-10-CM | POA: Diagnosis not present

## 2020-05-24 DIAGNOSIS — K219 Gastro-esophageal reflux disease without esophagitis: Secondary | ICD-10-CM | POA: Diagnosis not present

## 2020-05-29 ENCOUNTER — Telehealth: Payer: Self-pay | Admitting: Internal Medicine

## 2020-05-29 NOTE — Telephone Encounter (Signed)
Called patient to see who she wanted as a pcp, daugter said they are still undecided. Informed her of refill hold up and she said she was aware.

## 2020-05-30 ENCOUNTER — Telehealth: Payer: Self-pay

## 2020-05-30 DIAGNOSIS — G8929 Other chronic pain: Secondary | ICD-10-CM | POA: Diagnosis not present

## 2020-05-30 DIAGNOSIS — M419 Scoliosis, unspecified: Secondary | ICD-10-CM | POA: Diagnosis not present

## 2020-05-30 DIAGNOSIS — N189 Chronic kidney disease, unspecified: Secondary | ICD-10-CM | POA: Diagnosis not present

## 2020-05-30 DIAGNOSIS — F0391 Unspecified dementia with behavioral disturbance: Secondary | ICD-10-CM | POA: Diagnosis not present

## 2020-05-30 DIAGNOSIS — I5032 Chronic diastolic (congestive) heart failure: Secondary | ICD-10-CM | POA: Diagnosis not present

## 2020-05-30 DIAGNOSIS — I130 Hypertensive heart and chronic kidney disease with heart failure and stage 1 through stage 4 chronic kidney disease, or unspecified chronic kidney disease: Secondary | ICD-10-CM | POA: Diagnosis not present

## 2020-05-30 NOTE — Telephone Encounter (Signed)
Incoming call received on voicemail from [facility] [place] stating patient had a recent fall with no injuries and vitals within normal limits.  I called Hinton Dyer back to inform her that she should speak with patients daughter to confirm PCP as we are not listed as PCP.

## 2020-06-01 ENCOUNTER — Encounter: Payer: Self-pay | Admitting: Internal Medicine

## 2020-06-12 ENCOUNTER — Ambulatory Visit: Payer: Medicare Other | Attending: Internal Medicine

## 2020-06-12 DIAGNOSIS — Z23 Encounter for immunization: Secondary | ICD-10-CM

## 2020-06-12 NOTE — Progress Notes (Signed)
   Covid-19 Vaccination Clinic  Name:  [name] Morton    MRN: [id number] DOB: 1926/12/08  06/12/2020  Ms. [name] Morton was observed post Covid-19 immunization for 15 minutes without incident. She was provided with Vaccine Information Sheet and instruction to access the V-Safe system.   Ms. [name] Morton was instructed to call 911 with any severe reactions post vaccine: [name] Kitchen Difficulty breathing  . Swelling of face and throat  . A fast heartbeat  . A bad rash all over body  . Dizziness and weakness   Immunizations Administered    Name Date Dose VIS Date Route   Moderna Covid-19 Booster Vaccine 06/12/2020  9:17 AM 0.25 mL 12/20/2019 Intramuscular   Manufacturer: Moderna   Lot: [id number]   [place]: [id number]

## 2020-06-17 ENCOUNTER — Other Ambulatory Visit (HOSPITAL_COMMUNITY): Payer: Self-pay

## 2020-06-17 MED ORDER — COVID-19 MRNA VACC (MODERNA) 100 MCG/0.5ML IM SUSP
INTRAMUSCULAR | 0 refills | Status: DC
  Filled 2020-06-17: qty 0.5, 17d supply, fill #0

## 2020-06-18 ENCOUNTER — Other Ambulatory Visit (HOSPITAL_COMMUNITY): Payer: Self-pay

## 2020-06-19 ENCOUNTER — Other Ambulatory Visit (HOSPITAL_COMMUNITY): Payer: Self-pay

## 2020-06-21 DIAGNOSIS — F0391 Unspecified dementia with behavioral disturbance: Secondary | ICD-10-CM | POA: Diagnosis not present

## 2020-06-21 DIAGNOSIS — G8929 Other chronic pain: Secondary | ICD-10-CM | POA: Diagnosis not present

## 2020-06-21 DIAGNOSIS — N189 Chronic kidney disease, unspecified: Secondary | ICD-10-CM | POA: Diagnosis not present

## 2020-06-21 DIAGNOSIS — I5032 Chronic diastolic (congestive) heart failure: Secondary | ICD-10-CM | POA: Diagnosis not present

## 2020-06-21 DIAGNOSIS — I130 Hypertensive heart and chronic kidney disease with heart failure and stage 1 through stage 4 chronic kidney disease, or unspecified chronic kidney disease: Secondary | ICD-10-CM | POA: Diagnosis not present

## 2020-06-21 DIAGNOSIS — M419 Scoliosis, unspecified: Secondary | ICD-10-CM | POA: Diagnosis not present

## 2020-06-23 DIAGNOSIS — Z853 Personal history of malignant neoplasm of breast: Secondary | ICD-10-CM | POA: Diagnosis not present

## 2020-06-23 DIAGNOSIS — G4700 Insomnia, unspecified: Secondary | ICD-10-CM | POA: Diagnosis not present

## 2020-06-23 DIAGNOSIS — D62 Acute posthemorrhagic anemia: Secondary | ICD-10-CM | POA: Diagnosis not present

## 2020-06-23 DIAGNOSIS — M419 Scoliosis, unspecified: Secondary | ICD-10-CM | POA: Diagnosis not present

## 2020-06-23 DIAGNOSIS — F419 Anxiety disorder, unspecified: Secondary | ICD-10-CM | POA: Diagnosis not present

## 2020-06-23 DIAGNOSIS — E441 Mild protein-calorie malnutrition: Secondary | ICD-10-CM | POA: Diagnosis not present

## 2020-06-23 DIAGNOSIS — M8580 Other specified disorders of bone density and structure, unspecified site: Secondary | ICD-10-CM | POA: Diagnosis not present

## 2020-06-23 DIAGNOSIS — Z96652 Presence of left artificial knee joint: Secondary | ICD-10-CM | POA: Diagnosis not present

## 2020-06-23 DIAGNOSIS — E785 Hyperlipidemia, unspecified: Secondary | ICD-10-CM | POA: Diagnosis not present

## 2020-06-23 DIAGNOSIS — F0391 Unspecified dementia with behavioral disturbance: Secondary | ICD-10-CM | POA: Diagnosis not present

## 2020-06-23 DIAGNOSIS — K219 Gastro-esophageal reflux disease without esophagitis: Secondary | ICD-10-CM | POA: Diagnosis not present

## 2020-06-23 DIAGNOSIS — N189 Chronic kidney disease, unspecified: Secondary | ICD-10-CM | POA: Diagnosis not present

## 2020-06-23 DIAGNOSIS — G629 Polyneuropathy, unspecified: Secondary | ICD-10-CM | POA: Diagnosis not present

## 2020-06-23 DIAGNOSIS — Z9181 History of falling: Secondary | ICD-10-CM | POA: Diagnosis not present

## 2020-06-23 DIAGNOSIS — I080 Rheumatic disorders of both mitral and aortic valves: Secondary | ICD-10-CM | POA: Diagnosis not present

## 2020-06-23 DIAGNOSIS — H903 Sensorineural hearing loss, bilateral: Secondary | ICD-10-CM | POA: Diagnosis not present

## 2020-06-23 DIAGNOSIS — I5032 Chronic diastolic (congestive) heart failure: Secondary | ICD-10-CM | POA: Diagnosis not present

## 2020-06-23 DIAGNOSIS — I890 Lymphedema, not elsewhere classified: Secondary | ICD-10-CM | POA: Diagnosis not present

## 2020-06-23 DIAGNOSIS — G8929 Other chronic pain: Secondary | ICD-10-CM | POA: Diagnosis not present

## 2020-06-23 DIAGNOSIS — F32A Depression, unspecified: Secondary | ICD-10-CM | POA: Diagnosis not present

## 2020-06-23 DIAGNOSIS — I130 Hypertensive heart and chronic kidney disease with heart failure and stage 1 through stage 4 chronic kidney disease, or unspecified chronic kidney disease: Secondary | ICD-10-CM | POA: Diagnosis not present

## 2020-06-23 DIAGNOSIS — K529 Noninfective gastroenteritis and colitis, unspecified: Secondary | ICD-10-CM | POA: Diagnosis not present

## 2020-06-23 DIAGNOSIS — K5730 Diverticulosis of large intestine without perforation or abscess without bleeding: Secondary | ICD-10-CM | POA: Diagnosis not present

## 2020-06-24 ENCOUNTER — Telehealth: Payer: Self-pay

## 2020-06-24 NOTE — Telephone Encounter (Signed)
[name] from [facility] called to ask about an order she says was faxed over on 06/04/2020 I told her I didn't see any orders faxed on that dates I checked with [name] up at the front desk and she showed me an order that was faxed on 06/10/2020 and she said she has sent this order back twice I tried to recall the number on the order and got voice mail I left a message to call office

## 2020-07-02 ENCOUNTER — Other Ambulatory Visit: Payer: Self-pay | Admitting: *Deleted

## 2020-07-02 DIAGNOSIS — I5032 Chronic diastolic (congestive) heart failure: Secondary | ICD-10-CM

## 2020-07-02 DIAGNOSIS — E785 Hyperlipidemia, unspecified: Secondary | ICD-10-CM

## 2020-07-02 NOTE — Telephone Encounter (Signed)
[facility] requested refill Pended Rx and sent to Dr. Sabra Heck for approval due to [place].

## 2020-07-03 MED ORDER — SIMVASTATIN 20 MG PO TABS: ORAL_TABLET | ORAL | 1 refills | Status: DC

## 2020-07-03 MED ORDER — AMLODIPINE BESY-BENAZEPRIL HCL 10-20 MG PO CAPS: 1.0000 | ORAL_CAPSULE | Freq: Every day | ORAL | 1 refills | Status: AC

## 2020-07-04 DIAGNOSIS — I130 Hypertensive heart and chronic kidney disease with heart failure and stage 1 through stage 4 chronic kidney disease, or unspecified chronic kidney disease: Secondary | ICD-10-CM | POA: Diagnosis not present

## 2020-07-04 DIAGNOSIS — N189 Chronic kidney disease, unspecified: Secondary | ICD-10-CM | POA: Diagnosis not present

## 2020-07-04 DIAGNOSIS — I5032 Chronic diastolic (congestive) heart failure: Secondary | ICD-10-CM | POA: Diagnosis not present

## 2020-07-04 DIAGNOSIS — G8929 Other chronic pain: Secondary | ICD-10-CM | POA: Diagnosis not present

## 2020-07-04 DIAGNOSIS — M419 Scoliosis, unspecified: Secondary | ICD-10-CM | POA: Diagnosis not present

## 2020-07-04 DIAGNOSIS — F0391 Unspecified dementia with behavioral disturbance: Secondary | ICD-10-CM | POA: Diagnosis not present

## 2020-07-17 ENCOUNTER — Other Ambulatory Visit: Payer: Self-pay

## 2020-07-17 DIAGNOSIS — R5381 Other malaise: Secondary | ICD-10-CM

## 2020-07-17 DIAGNOSIS — M545 Low back pain, unspecified: Secondary | ICD-10-CM

## 2020-07-17 DIAGNOSIS — G8929 Other chronic pain: Secondary | ICD-10-CM

## 2020-07-17 MED ORDER — MELOXICAM 7.5 MG PO TABS: 7.5000 mg | ORAL_TABLET | Freq: Every day | ORAL | 1 refills | Status: DC | PRN

## 2020-10-21 DIAGNOSIS — I509 Heart failure, unspecified: Secondary | ICD-10-CM | POA: Diagnosis not present

## 2020-10-21 DIAGNOSIS — D510 Vitamin B12 deficiency anemia due to intrinsic factor deficiency: Secondary | ICD-10-CM | POA: Diagnosis not present

## 2020-10-21 DIAGNOSIS — D649 Anemia, unspecified: Secondary | ICD-10-CM | POA: Diagnosis not present

## 2020-11-22 ENCOUNTER — Other Ambulatory Visit (HOSPITAL_BASED_OUTPATIENT_CLINIC_OR_DEPARTMENT_OTHER): Payer: Self-pay

## 2020-11-22 ENCOUNTER — Ambulatory Visit: Payer: Medicare Other | Attending: Internal Medicine

## 2020-11-22 DIAGNOSIS — Z23 Encounter for immunization: Secondary | ICD-10-CM

## 2020-11-22 MED ORDER — PFIZER COVID-19 VAC BIVALENT 30 MCG/0.3ML IM SUSP
INTRAMUSCULAR | 0 refills | Status: DC
  Filled 2020-11-22: qty 0.3, 1d supply, fill #0

## 2020-11-22 NOTE — Progress Notes (Signed)
   Covid-19 Vaccination Clinic  Name:  [name] Morton    MRN: [id number] DOB: 1926/11/19  11/22/2020  Ms. [name] Morton was observed post Covid-19 immunization for 15 minutes without incident. She was provided with Vaccine Information Sheet and instruction to access the V-Safe system.   Ms. [name] Morton was instructed to call 911 with any severe reactions post vaccine: Difficulty breathing  Swelling of face and throat  A fast heartbeat  A bad rash all over body  Dizziness and weakness

## 2020-11-25 ENCOUNTER — Other Ambulatory Visit (HOSPITAL_BASED_OUTPATIENT_CLINIC_OR_DEPARTMENT_OTHER): Payer: Self-pay

## 2020-12-06 ENCOUNTER — Other Ambulatory Visit (HOSPITAL_COMMUNITY): Payer: Self-pay

## 2020-12-07 ENCOUNTER — Other Ambulatory Visit (HOSPITAL_COMMUNITY): Payer: Self-pay

## 2020-12-07 MED ORDER — INFLUENZA VAC A&B SA ADJ QUAD 0.5 ML IM PRSY
PREFILLED_SYRINGE | INTRAMUSCULAR | 0 refills | Status: DC
  Filled 2020-12-07: qty 0.5, 1d supply, fill #0

## 2021-05-22 ENCOUNTER — Emergency Department (HOSPITAL_COMMUNITY): Payer: Medicare Other

## 2021-05-22 ENCOUNTER — Emergency Department (HOSPITAL_COMMUNITY)
Admission: EM | Admit: 2021-05-22 | Discharge: 2021-05-23 | Disposition: A | Discharge status: Home / Self Care | Payer: Medicare Other | Attending: Emergency Medicine | Admitting: Emergency Medicine

## 2021-05-22 ENCOUNTER — Encounter (HOSPITAL_COMMUNITY): Payer: Self-pay | Admitting: *Deleted

## 2021-05-22 ENCOUNTER — Other Ambulatory Visit: Payer: Self-pay

## 2021-05-22 DIAGNOSIS — I129 Hypertensive chronic kidney disease with stage 1 through stage 4 chronic kidney disease, or unspecified chronic kidney disease: Secondary | ICD-10-CM | POA: Insufficient documentation

## 2021-05-22 DIAGNOSIS — R4182 Altered mental status, unspecified: Secondary | ICD-10-CM | POA: Insufficient documentation

## 2021-05-22 DIAGNOSIS — R55 Syncope and collapse: Secondary | ICD-10-CM | POA: Insufficient documentation

## 2021-05-22 DIAGNOSIS — N189 Chronic kidney disease, unspecified: Secondary | ICD-10-CM | POA: Insufficient documentation

## 2021-05-22 DIAGNOSIS — Z79899 Other long term (current) drug therapy: Secondary | ICD-10-CM | POA: Diagnosis not present

## 2021-05-22 DIAGNOSIS — Z853 Personal history of malignant neoplasm of breast: Secondary | ICD-10-CM | POA: Diagnosis not present

## 2021-05-22 DIAGNOSIS — F0390 Unspecified dementia without behavioral disturbance: Secondary | ICD-10-CM | POA: Insufficient documentation

## 2021-05-22 LAB — BASIC METABOLIC PANEL
Anion gap: 6 (ref 5–15)
BUN: 12 mg/dL (ref 8–23)
CO2: 28 mmol/L (ref 22–32)
Calcium: 9.1 mg/dL (ref 8.9–10.3)
Chloride: 106 mmol/L (ref 98–111)
Creatinine, Ser: 0.84 mg/dL (ref 0.44–1.00)
GFR, Estimated: >60 mL/min (ref >60)
Glucose, Bld: 112 mg/dL — ABNORMAL HIGH (ref 70–99)
Potassium: 3.7 mmol/L (ref 3.5–5.1)
Sodium: 140 mmol/L (ref 135–145)

## 2021-05-22 LAB — URINALYSIS, ROUTINE W REFLEX MICROSCOPIC
Bilirubin Urine: NEGATIVE
Glucose, UA: NEGATIVE mg/dL
Hgb urine dipstick: NEGATIVE
Ketones, ur: NEGATIVE mg/dL
Leukocytes,Ua: NEGATIVE
Nitrite: NEGATIVE
Protein, ur: NEGATIVE mg/dL
Specific Gravity, Urine: 1.005 (ref 1.005–1.030)
pH: 7 (ref 5.0–8.0)

## 2021-05-22 LAB — CBC
HCT: 35.6 % — ABNORMAL LOW (ref 36.0–46.0)
Hemoglobin: 11.8 g/dL — ABNORMAL LOW (ref 12.0–15.0)
MCH: 29.9 pg (ref 26.0–34.0)
MCHC: 33.1 g/dL (ref 30.0–36.0)
MCV: 90.1 fL (ref 80.0–100.0)
Platelets: 247 10*3/uL (ref 150–400)
RBC: 3.95 MIL/uL (ref 3.87–5.11)
RDW: 12.9 % (ref 11.5–15.5)
WBC: 5.9 10*3/uL (ref 4.0–10.5)
nRBC: 0 % (ref 0.0–0.2)

## 2021-05-22 LAB — CBG MONITORING, ED: Glucose-Capillary: 108 mg/dL — ABNORMAL HIGH (ref 70–99)

## 2021-05-22 LAB — TROPONIN I (HIGH SENSITIVITY): Troponin I (High Sensitivity): 6 ng/L (ref <18)

## 2021-05-22 NOTE — ED Notes (Signed)
Pt states that she has to void.  Helped pt up to [facility] to void.  Pt is alert but is confused. Pt is able to tell me her name but not her birthday.  She is also not able to tell me the date or even the year.  She can not tell me the name of the president.  She has difficulty telling me names of family members but is able to tell me that she has 2 daughters and after a bit of time she is able to tell me that one of her daughters is "shell" but not any last name.  Pt is able to give me her address.  No focal weakness noted, speech clear, no facial droop.   ?

## 2021-05-22 NOTE — ED Notes (Signed)
Pt was helped to [facility] to void and we wanted to collect sample, unfortunately there was a stool contamination when pt had a BM, will attempt to collect urine once pt is able to void again ?

## 2021-05-22 NOTE — ED Notes (Signed)
Went to check on pt and found that she had pulled her IV out (small bruise in her left hand where IV was) and has also pulled purewick out ?

## 2021-05-22 NOTE — ED Provider Notes (Signed)
?[facility] DEPT ?Provider Note ? ? ?CSN: [id number] ?Arrival date & time: 05/22/21  1232 ? ?  ? ?History ? ?Chief Complaint  ?Patient presents with  ? Loss of Consciousness  ? Altered Mental Status  ? ? ?[name] Morton is a 88 y.o. female. ? ? ?Loss of Consciousness ?Altered Mental Status ?Patient presents with syncopal episode.  Reportedly was going down the stairs and passed out.  Reportedly was unconscious for 30 seconds to caregiver.  However caregivers not here.  Patient does not remember the event.  Does have a history of dementia.  Unsure baseline but cannot tell me the year at this time.  Patient reportedly lives by herself but does have weekly caregiver help. ?  ?Past Medical History:  ?Diagnosis Date  ? Breast cancer ([place]) 1997  ? right - tx with lumpectomty & radiation  ? Burning sensation of feet   ? Per New Patient Packet-[facility]   ? Chronic anxiety   ? Chronic kidney disease   ? Per New Patient Packet-[facility]   ? Cognitive impairment   ? Per New Patient Packet[facility]   ? Diverticulosis   ? Dyslipidemia   ? Fluid retention   ? Per New Patient Packet-[facility]   ? Gait difficulty   ? GERD (gastroesophageal reflux disease)   ? RARE - NO MEDICATIONS  ? Hearing impairment   ? BILATERAL HEARING AIDS  ? Heart murmur   ? Per New Patient Packet-[facility]   ? High cholesterol   ? Per New Patient Packet-PSC   ? History of nuclear stress test 11/2009  ? dipyridamole; no evidence of ischemia, normal, low risk   ? History of shingles 1998  ? NO RESIDUAL PROBLEMS  ? Hypertension   ? SEES CARDIOLOGIST DR. DAVID HARDING FOR HYPERTENSION   ? Insomnia   ? Lymphedema of arm   ? RIGHT   ? Mitral and aortic regurgitation   ? OA (osteoarthritis)   ? OA AND PAIN LEFT KNEE  ? Osteopenia   ? Scoliosis   ? Per New Patient Packet-PSC   ? Shortness of breath   ? WHEN CLIMBING STEPS AT MY HOME--OTHERWISE OK  ? Sinus problem   ? Sleep difficulties   ? ALWAYS HAD SLEEPING PROBLEMS - MEDICATION HELPS PT SLEEP ABOUT 3 HOURS   ? SOB (shortness of breath)   ? Per New Patient [place], hospitalized 2018   ? ? ?Home Medications ?Prior to Admission medications   ?Medication Sig Start Date End Date Taking? Authorizing Provider  ?acetaminophen (TYLENOL) 650 MG CR tablet Take 1,300 mg by mouth 2 (two) times daily.   Yes [provider]  ?amLODipine-benazepril (LOTREL) 10-20 MG capsule TAKE 1 CAPSULE BY MOUTH DAILY. 07/03/20 07/03/21 Yes Wardell Honour, MD  ?bisoprolol (ZEBETA) 5 MG tablet TAKE 1 TABLET DAILY ?Patient taking differently: Take 5 mg by mouth daily. 04/12/20  Yes Reed, [name], DO  ?diclofenac Sodium (VOLTAREN) 1 % GEL Apply 2 g topically 4 (four) times daily as needed.   Yes [provider]  ?donepezil (ARICEPT) 5 MG tablet TAKE 1 TABLET AT BEDTIME FOR MEMORY LOSS ?Patient taking differently: Take 5 mg by mouth at bedtime. 04/17/20  Yes Reed, [name], DO  ?furosemide (LASIX) 40 MG tablet TAKE 1 TABLET DAILY AND MAY TAKE AN EXTRA ONE-HALF TABLET (20 MG) IF NEEDED FOR PUFFINESS ?Patient taking differently: Take 40-60 mg by mouth See admin instructions. Take 40 mg daily and may take 1/2 tablet ( 20 mg)  extra if needed for puffiness 05/15/20  Yes Reed, [name], DO  ?gabapentin (NEURONTIN) 100 MG capsule TAKE 3 CAPSULES EVERY NIGHT ?Patient taking differently: Take 300 mg by mouth at bedtime. Take 3 capsules every night 04/17/20  Yes Reed, [name], DO  ?Liniments (SALONPAS EX) Apply 1 application. topically daily as needed (back pain).   Yes [provider]  ?meloxicam (MOBIC) 7.5 MG tablet Take 1 tablet (7.5 mg total) by mouth daily as needed for pain. 07/17/20  Yes Wardell Honour, MD  ?mirtazapine (REMERON) 7.5 MG tablet Take 1 tablet (7.5 mg total) by mouth at bedtime. 12/28/19  Yes Reed, [name], DO  ?potassium chloride SA (KLOR-CON) 20 MEQ tablet Take 1 tablet (20 mEq total) by mouth as needed. Only when takes Lasix 05/07/20  Yes Reed, [name], DO  ?COVID-19 mRNA bivalent vaccine, Pfizer,  (PFIZER COVID-19 VAC BIVALENT) injection Inject into the muscle. 11/22/20   Carlyle Basques, MD  ?COVID-19 mRNA vaccine, Moderna, 100 MCG/0.5ML injection Inject into the muscle. 06/12/20   Carlyle Basques, MD  ?influenza vaccine adjuvanted (FLUAD) 0.5 ML injection Inject into the muscle. 12/07/20   Carlyle Basques, MD  ?   ? ?Allergies    ?Benadryl [diphenhydramine hcl (sleep)], Cardizem [diltiazem hcl], Ceftin [cefuroxime axetil], Epinephrine, Oxycodone hcl, Tetanus toxoids, Tramadol hcl, Vistaril [hydroxyzine], and Vytorin [ezetimibe-simvastatin]   ? ?Review of Systems   ?Review of Systems  ?Cardiovascular:  Positive for syncope.  ?Neurological:  Positive for syncope.  ? ?Physical Exam ?Updated Vital Signs ?BP (!) 182/68   Pulse 63   Temp 98.6 ?F (37 ?C)   Resp 18   Wt 62.1 kg   SpO2 98%   BMI 26.76 kg/m?  ?Physical Exam ?Vitals and nursing note reviewed.  ?HENT:  ?   Head: Atraumatic.  ?Cardiovascular:  ?   Rate and Rhythm: Regular rhythm.  ?Pulmonary:  ?   Breath sounds: No wheezing or rhonchi.  ?Abdominal:  ?   Tenderness: There is no abdominal tenderness.  ?Musculoskeletal:  ?   Cervical back: Neck supple.  ?Skin: ?   General: Skin is warm.  ?Neurological:  ?   Mental Status: She is alert.  ?   Comments: Awake and pleasant.  Moving all extremities, however somewhat confused.  Unable to tell me the year.  ? ? ?ED Results / Procedures / Treatments   ?Labs ?(all labs ordered are listed, but only abnormal results are displayed) ?Labs Reviewed  ?BASIC METABOLIC PANEL - Abnormal; Notable for the following components:  ?    Result Value  ? Glucose, Bld 112 (*)   ? All other components within normal limits  ?CBC - Abnormal; Notable for the following components:  ? Hemoglobin 11.8 (*)   ? HCT 35.6 (*)   ? All other components within normal limits  ?URINALYSIS, ROUTINE W REFLEX MICROSCOPIC - Abnormal; Notable for the following components:  ? Color, Urine COLORLESS (*)   ? All other components within normal limits   ?CBG MONITORING, ED - Abnormal; Notable for the following components:  ? Glucose-Capillary 108 (*)   ? All other components within normal limits  ?TROPONIN I (HIGH SENSITIVITY)  ?TROPONIN I (HIGH SENSITIVITY)  ? ? ?EKG ?EKG Interpretation ? ?Date/Time:  Thursday May 22 2021 13:08:06 EDT ?Ventricular Rate:  73 ?PR Interval:  206 ?QRS Duration: 90 ?QT Interval:  408 ?QTC Calculation: 450 ?R Axis:   89 ?Text Interpretation: duplicate Confirmed by Davonna Belling [id number]) on 05/22/2021 4:01:52 PM ? ?Radiology ?CT  HEAD WO CONTRAST (5MM) ? ?Result Date: 05/22/2021 ?CLINICAL DATA:  Mental status change of unknown cause. Syncopal episode. EXAM: CT HEAD WITHOUT CONTRAST TECHNIQUE: Contiguous axial images were obtained from the base of the skull through the vertex without intravenous contrast. RADIATION DOSE REDUCTION: This exam was performed according to the departmental dose-optimization program which includes automated exposure control, adjustment of the mA and/or kV according to patient size and/or use of iterative reconstruction technique. COMPARISON:  MRI 05/25/2017 FINDINGS: Brain: Generalized atrophy. Mild chronic small-vessel ischemic change of the cerebral hemispheric white matter. No sign of acute infarction, mass lesion, hemorrhage, hydrocephalus or extra-axial collection. Vascular: There is atherosclerotic calcification of the major vessels at the base of the brain. Skull: Negative Sinuses/Orbits: Clear/normal Other: None IMPRESSION: No acute finding. Age related atrophy. Chronic small-vessel ischemic changes of the cerebral hemispheric white matter. Electronically Signed   By: Nelson Chimes M.D.   On: 05/22/2021 18:22  ? ?DG Chest Portable 1 View ? ?Result Date: 05/22/2021 ?CLINICAL DATA:  Altered mental status, fall EXAM: PORTABLE CHEST 1 VIEW COMPARISON:  November 17, 2017 FINDINGS: Cardiac silhouette is partially obscured but appears mildly enlarged similar prior. Aortic atherosclerosis. Elevation of the  right hemidiaphragm unchanged from prior. Streaky opacities in the right lung base likely reflect atelectasis. No visible pleural effusion or pneumothorax. Surgical clips overlie the right chest and axilla. IMPRESSION:

## 2021-05-22 NOTE — ED Notes (Signed)
Spoke with daughter on phone regarding discharge, pt is confused and does not have a key  to her home, daughter is in [place] and will not be able to come and get her mother until the morning.  ?

## 2021-05-22 NOTE — ED Notes (Signed)
Called pt daughter [name] to speak with her and she is able to confirm that pt has a cognitive impairment and the memory issues we are seeing today as well as the episode of syncope she had pta is a behavior that pt sometimes displays.  When discussed with pt daughter how pt memory is she states that this all sounds like pt normal baseline.  Daughter is out of town and as far as discharge planning pt does not appear to have housekey with her.  Will check with EDP regarding dispo and call daughter back.  ?

## 2021-05-22 NOTE — ED Notes (Signed)
PT in room on bed eating a ham sandwich and watching TV. Placed in direct view of the nurses station and bed in a safe low position with call light in reach.  ?

## 2021-05-22 NOTE — ED Triage Notes (Signed)
Pt arrives from home where she lives by herself with weekly help from a caregiver.  Pt was coming down steps and had a syncopal episode, no fall.  Pt was lowered to the ground by her caretaker who reports that this episode lasted about 30seconds.  Pt recalls getting to the bottom of the steps but does not recall syncope or even that caretaker was with her when she fell.  Pt also did not recognize her caretaker (who has been taking care of her "for a while").  Per caretaker pt has been feeling generally unwell for a few days and has had poor po intake.  Pt did not seem to have any focal weakness or focal neuro deficit for ems.   ?

## 2021-05-22 NOTE — ED Notes (Signed)
Call daughter [name] [phone].regarding discharge  ? ?

## 2021-05-23 MED ORDER — ACETAMINOPHEN 325 MG PO TABS
650.0000 mg | ORAL_TABLET | Freq: Once | ORAL | Status: AC
  Administered 2021-05-23: 650 mg via ORAL
  Filled 2021-05-23: qty 2

## 2021-05-23 NOTE — ED Notes (Signed)
PT sleeping at this time. NAD, bed in a safe low position with call light in reach and patient in direct view of nurses station ?

## 2021-05-23 NOTE — ED Notes (Signed)
PT assisted to the restroom. Gait slow and steady, assisted back to bed with alarm set, bed in a safe low position and call light within reach. ?

## 2021-05-23 NOTE — ED Notes (Signed)
Pt daughter called and sts she should be here around 3pm today.  ?

## 2021-05-23 NOTE — ED Notes (Signed)
Pt is awake and being assisted with getting dressed at this time.  ?

## 2021-05-23 NOTE — ED Notes (Signed)
Called pt daughter [name] [phone] (regarding discharge) and left a HIPPA compliant message on voicemail.  ?

## 2021-05-23 NOTE — ED Notes (Signed)
Pt daughter, [name], said she will pick up pt between 3:00 - 3:30 PM. ?

## 2021-11-06 ENCOUNTER — Other Ambulatory Visit: Payer: Medicare Other | Admitting: Internal Medicine

## 2021-11-06 ENCOUNTER — Encounter: Payer: Self-pay | Admitting: Internal Medicine

## 2021-11-06 VITALS — BP 160/72 | HR 78 | Temp 97.7°F | Resp 16

## 2021-11-06 DIAGNOSIS — F03918 Unspecified dementia, unspecified severity, with other behavioral disturbance: Secondary | ICD-10-CM

## 2021-11-06 DIAGNOSIS — I358 Other nonrheumatic aortic valve disorders: Secondary | ICD-10-CM

## 2021-11-06 DIAGNOSIS — M179 Osteoarthritis of knee, unspecified: Secondary | ICD-10-CM

## 2021-11-06 DIAGNOSIS — Z0000 Encounter for general adult medical examination without abnormal findings: Secondary | ICD-10-CM

## 2021-11-06 DIAGNOSIS — G629 Polyneuropathy, unspecified: Secondary | ICD-10-CM

## 2021-11-06 DIAGNOSIS — E441 Mild protein-calorie malnutrition: Secondary | ICD-10-CM

## 2021-11-06 MED ORDER — DULOXETINE HCL 40 MG PO CPEP: 40.0000 mg | ORAL_CAPSULE | Freq: Every day | ORAL | 3 refills | Status: DC

## 2021-11-06 MED ORDER — GABAPENTIN 300 MG PO CAPS: 300.0000 mg | ORAL_CAPSULE | Freq: Every day | ORAL | 3 refills | Status: AC

## 2021-11-06 MED ORDER — FUROSEMIDE 40 MG PO TABS: ORAL_TABLET | ORAL | 3 refills | Status: DC

## 2021-11-06 NOTE — Patient Instructions (Addendum)
Decrease tylenol to 1 arthritis tab at bedtime. Increase duloxetine to '40mg'$  nightly. Move all meds to bedtime with a reminder call from family  I recommend the RSV vaccine, bivalent covid booster and annual senior flu shot. Eventually, I also recommend the shingrix series for her to prevent shingles or at least decrease the severity of the outbreak and risks of post-herpetic neuralgia/chronic pain in the affected area.

## 2021-11-06 NOTE — Progress Notes (Signed)
[place] Visit Telephone: [phone]  Fax: [phone]   Date of encounter: 11/06/21 10:35 AM PATIENT NAME: [name] Morton [place] [address]   [phone] (home)  DOB: 02-22-1927 MRN: [id number]  PRIMARY CARE PROVIDER:    Gayland Curry, DO,  [place] [address] [phone]    RESPONSIBLE PARTY:    Contact Information     Name Relation Home Work Mobile   [name] Morton Daughter [phone]  [phone]        I met face to face with patient and family in her home along with her son-in-law, [name] Morton.  This is follow-up visit.                                     ASSESSMENT AND PLAN / RECOMMENDATIONS:   Advance Care Planning/Goals of Care: Goals include to maximize quality of life and symptom management. Patient/health care surrogate gave his/her permission to discuss.Our advance care planning conversation included a discussion about:    The value and importance of advance care planning  Experiences with loved ones who have been seriously ill or have died  Exploration of personal, cultural or spiritual beliefs that might influence medical decisions  Exploration of goals of care in the event of a sudden injury or illness  Identification  of a healthcare agent--has living will/hcpoa on file. Review and updating or creation of an  advance directive document . She is still left alone at times b/c she kicks people out that are hired by Iona and her husband.   Decision not to resuscitate or to de-escalate disease focused treatments due to poor prognosis. CODE STATUS:  DNR, MOST is not on file--should complete with family next visit or even between visits  Symptom Management/Plan: 1. Dementia with behavioral disturbance ([place]) -progressing gradually -she is having short-term memory loss over just few mins now -continues to perform ADLs independently but requires help from  family and caregivers with meal prep, med mgt and cues to eat and take meds -reports remaining continent -spents a lot of time in bed or sitting outside watching neighbors walk/run/drive past -she ideally should have someone with her during all wakeful hours, but she declines this and gets very agitated about the idea -family continues to manage to get someone in the home a few hours a few days per week in addition to her SIL and daughter   2. Mild protein-calorie malnutrition ([facility]) -sounds like she's usually eating one meal per day and her selections are not healthy ones though her family is supplying her with food--she is extremely picky and eats when she feels like it (not entirely new for her)  3. Neuropathy -no changes here, and she does not report pain at this point, but is struggling to take meds bid--is missing them often so will change entire regimen to where she takes all meds at hs and gets a reminder from her son-in-law - DULoxetine 40 MG CPEP; Take 40 mg by mouth daily.  Dispense: 90 capsule; Refill: 3 - gabapentin (NEURONTIN) 300 MG capsule; Take 1 capsule (300 mg total) by mouth at bedtime.  Dispense: 90 capsule; Refill: 3  4. Aortic systolic murmur on examination -she reports no chest pain, sob and has no increase in edema despite preferring salty items like bojangles biscuits, pizza and chicken tenders  5. Osteoarthritis of knee, unspecified laterality,  unspecified osteoarthritis type -change tylenol to just one arthritis strength at bedtime and increase duloxetine to accommodate and potentially help with her agitation/periods of belligerent behavior when help offered - DULoxetine 40 MG CPEP; Take 40 mg by mouth daily.  Dispense: 90 capsule; Refill: 3  6. Routine health maintenance -[name] Morton is planning to get her mother the bivalent covid and flu shot -also suggested RSV -should ideally have the shingrix series -new covid now recommended, as well, after bivalent  completed.  Return 3-4 mos and prn.  This visit was coded based on medical decision making (MDM).  PPS: 50%  HOSPICE ELIGIBILITY/DIAGNOSIS: TBD  Chief Complaint: Follow-up palliative visit  HISTORY OF PRESENT ILLNESS:  [name] Morton is a 88 y.o. year old female  with dementia, decreased po intake, altered sleep cycles long-term, OA, h/o falls but none recently, htn, neuropathy and edema.  She eats probably a 1/3 of a normal portion.  [name] Morton fills up the fridge for her.  She eats chicken, pizza, is picky about what she'll eat.  She doesn't eat corn dogs anymore.  She overall does not like to eat.  She says it's been a lifelong problem.    She still says she does not sleep good and never has.    She denies pain anywhere with her tylenol and cymbalta.  She will watch tv much of the night.  She still reads quite a bit.  She says [name] Morton helps her a lot.     Denies any incontinence.  Forgets every few minutes that I've seen her before and who I am. Keeps repeating "for someone as old as I am. 88"  Says God has been good to her.  Still sits outside each day.    Her only request is to have a car.    History obtained from review of EMR, discussion with primary team, and interview with family, facility staff/caregiver and/or [name].   I reviewed available labs, medications, imaging, studies and related documents from the EMR.  Records reviewed and summarized above.   ROS Review of Systems see hpi  Physical Exam: Vitals:   11/06/21 0905  BP: (!) 160/72  Pulse: 78  Resp: 16  Temp: 97.7 F (36.5 C)  SpO2: 93%   There is no height or weight on file to calculate BMI. Wt Readings from Last 500 Encounters:  05/23/21 137 lb (62.1 kg)  05/06/20 137 lb 3.2 oz (62.2 kg)  04/15/20 147 lb (66.7 kg)  04/05/20 144 lb 12.8 oz (65.7 kg)  10/12/19 144 lb (65.3 kg)  07/18/19 136 lb (61.7 kg)  02/02/19 147 lb (66.7 kg)  10/31/18 143 lb 3.2 oz (65 kg)  04/25/18 140 lb 12.8 oz (63.9  kg)  04/25/18 140 lb 12.8 oz (63.9 kg)  04/05/18 138 lb (62.6 kg)  03/01/18 140 lb 12.8 oz (63.9 kg)  01/11/18 142 lb (64.4 kg)  11/17/17 137 lb (62.1 kg)  08/17/17 142 lb (64.4 kg)  07/30/17 141 lb (64 kg)  10/07/16 146 lb 12.8 oz (66.6 kg)  09/15/16 146 lb (66.2 kg)  07/31/16 146 lb 6.4 oz (66.4 kg)  07/20/16 146 lb (66.2 kg)  07/01/16 146 lb (66.2 kg)  11/28/14 143 lb 6.4 oz (65 kg)  04/06/14 141 lb (64 kg)  11/30/13 130 lb 12.8 oz (59.3 kg)  05/01/13 144 lb (65.3 kg)  04/25/13 144 lb (65.3 kg)  12/27/12 148 lb 1.6 oz (67.2 kg)   Physical Exam Constitutional:      Appearance:  Normal appearance.  HENT:     Head: Normocephalic and atraumatic.     Right Ear: External ear normal.     Left Ear: External ear normal.     Nose: Nose normal.     Mouth/Throat:     Pharynx: Oropharynx is clear.  Eyes:     Conjunctiva/sclera: Conjunctivae normal.     Pupils: Pupils are equal, round, and reactive to light.     Comments: glasses  Cardiovascular:     Rate and Rhythm: Normal rate and regular rhythm.     Heart sounds: Murmur heard.  Pulmonary:     Effort: Pulmonary effort is normal.     Breath sounds: Normal breath sounds. No rales.  Abdominal:     General: Bowel sounds are normal.     Palpations: Abdomen is soft.  Musculoskeletal:        General: Normal range of motion.     Cervical back: Neck supple.     Comments: Mild nonpitting edema of both ankles  Skin:    General: Skin is warm and dry.  Neurological:     General: No focal deficit present.     Mental Status: She is alert.     Comments: Oriented to person, place, not time; short-term memory of a few mins of who I am, does recall she's seen  me before, that I've visited her home and that she saw me in my office in the past  Psychiatric:     Comments: Gets belligerent when discussed in home assistance and a bit paranoid about me typing in the computer; otherwise pleasant and sociable     CURRENT PROBLEM LIST:  Patient  Active Problem List   Diagnosis Date Noted   Proximal muscle weakness 10/12/2019   Lymphedema of right arm 10/12/2019   Mild protein-calorie malnutrition ([place]) 04/10/2019   Dementia with behavioral disturbance ([place]) 04/10/2019   Mild dementia ([place]) 08/24/2017   Neuropathy 08/24/2017   Syncope and collapse 10/09/2016   Exertional chest tightness 08/02/2016   Exertional dyspnea 81/27/5170   Aortic systolic murmur on examination 07/31/2016   Fatigue 07/01/2016   Acute blood loss anemia 04/02/2014   Colitis 01/74/9449   Diastolic dysfunction with chronic heart failure ([place]) 04/01/2014   GERD (gastroesophageal reflux disease) 05/02/2013   Diverticulosis of large intestine 05/02/2013   OA (osteoarthritis) of knee 05/01/2013   Arm edema - right 12/29/2012   Bilateral leg edema 12/29/2012    Class: History of   Essential hypertension    Dyslipidemia, goal LDL below 100     PAST MEDICAL HISTORY:  Active Ambulatory Problems    Diagnosis Date Noted   Essential hypertension    Dyslipidemia, goal LDL below 100    Arm edema - right 12/29/2012   Bilateral leg edema 12/29/2012   OA (osteoarthritis) of knee 05/01/2013   GERD (gastroesophageal reflux disease) 05/02/2013   Diverticulosis of large intestine 05/02/2013   Colitis 67/59/1638   Diastolic dysfunction with chronic heart failure ([place]) 04/01/2014   Acute blood loss anemia 04/02/2014   Fatigue 07/01/2016   Exertional dyspnea 46/65/9935   Aortic systolic murmur on examination 07/31/2016   Exertional chest tightness 08/02/2016   Syncope and collapse 10/09/2016   Mild dementia ([place]) 08/24/2017   Neuropathy 08/24/2017   Mild protein-calorie malnutrition ([place]) 04/10/2019   Dementia with behavioral disturbance ([place]) 04/10/2019   Proximal muscle weakness 10/12/2019   Lymphedema of right arm 10/12/2019   Resolved Ambulatory Problems    Diagnosis Date Noted  No Resolved Ambulatory Problems   Past Medical History:  Diagnosis Date    Breast cancer ([place]) 1997   Burning sensation of feet    Chronic anxiety    Chronic kidney disease    Cognitive impairment    Diverticulosis    Dyslipidemia    Fluid retention    Gait difficulty    Hearing impairment    Heart murmur    High cholesterol    History of nuclear stress test 11/2009   History of shingles 1998   Hypertension    Insomnia    Lymphedema of arm    Mitral and aortic regurgitation    OA (osteoarthritis)    Osteopenia    Scoliosis    Shortness of breath    Sinus problem    Sleep difficulties    SOB (shortness of breath)     SOCIAL HX:  Social History   Tobacco Use   Smoking status: Former    Years: 5.00    Types: Cigarettes   Smokeless tobacco: Never  Substance Use Topics   Alcohol use: No     ALLERGIES:  Allergies  Allergen Reactions   Benadryl [Diphenhydramine Hcl (Sleep)]     Makes me hyper    Cardizem [Diltiazem Hcl]     Doesn't remember. Dr Rex Kras told patient not to take.    Ceftin [Cefuroxime Axetil] Other (See Comments)    Stomach Issues    Epinephrine Other (See Comments)    unknown   Oxycodone Hcl     Loss of appetite: sores on tongue and mouth   Tetanus Toxoids Other (See Comments)    Unknown    Tramadol Hcl     Loss of appetite: dry mouth/sores on tongue and mouth   Vistaril [Hydroxyzine] Other (See Comments)    unknown   Vytorin [Ezetimibe-Simvastatin]     Not comfortable taking it. Doesn't remember reaction.       PERTINENT MEDICATIONS:  Outpatient Encounter Medications as of 11/06/2021  Medication Sig   acetaminophen (TYLENOL) 650 MG CR tablet Take 650 mg by mouth daily.   amLODipine-benazepril (LOTREL) 10-20 MG capsule TAKE 1 CAPSULE BY MOUTH DAILY.   bisoprolol (ZEBETA) 5 MG tablet TAKE 1 TABLET DAILY (Patient taking differently: Take 5 mg by mouth daily.)   donepezil (ARICEPT) 5 MG tablet TAKE 1 TABLET AT BEDTIME FOR MEMORY LOSS (Patient taking differently: Take 5 mg by mouth at bedtime.)   DULoxetine 40 MG  CPEP Take 40 mg by mouth daily.   furosemide (LASIX) 40 MG tablet TAKE 1 TABLET DAILY   gabapentin (NEURONTIN) 300 MG capsule Take 1 capsule (300 mg total) by mouth at bedtime.   potassium chloride SA (KLOR-CON) 20 MEQ tablet Take 1 tablet (20 mEq total) by mouth as needed. Only when takes Lasix   [DISCONTINUED] COVID-19 mRNA bivalent vaccine, Pfizer, (PFIZER COVID-19 VAC BIVALENT) injection Inject into the muscle.   [DISCONTINUED] COVID-19 mRNA vaccine, Moderna, 100 MCG/0.5ML injection Inject into the muscle.   [DISCONTINUED] diclofenac Sodium (VOLTAREN) 1 % GEL Apply 2 g topically 4 (four) times daily as needed.   [DISCONTINUED] DULoxetine (CYMBALTA) 20 MG capsule Take 20 mg by mouth daily.   [DISCONTINUED] furosemide (LASIX) 40 MG tablet TAKE 1 TABLET DAILY AND MAY TAKE AN EXTRA ONE-HALF TABLET (20 MG) IF NEEDED FOR PUFFINESS (Patient taking differently: Take 40-60 mg by mouth See admin instructions. Take 40 mg daily and may take 1/2 tablet ( 20 mg) extra if needed for puffiness)   [DISCONTINUED] gabapentin (  NEURONTIN) 100 MG capsule TAKE 3 CAPSULES EVERY NIGHT (Patient taking differently: Take 300 mg by mouth at bedtime. Take 3 capsules every night)   [DISCONTINUED] influenza vaccine adjuvanted (FLUAD) 0.5 ML injection Inject into the muscle.   [DISCONTINUED] Liniments (SALONPAS EX) Apply 1 application. topically daily as needed (back pain).   [DISCONTINUED] meloxicam (MOBIC) 7.5 MG tablet Take 1 tablet (7.5 mg total) by mouth daily as needed for pain.   [DISCONTINUED] mirtazapine (REMERON) 7.5 MG tablet Take 1 tablet (7.5 mg total) by mouth at bedtime.   No facility-administered encounter medications on file as of 11/06/2021.    Thank you for the opportunity to participate in the care of [name].  The palliative care team will continue to follow. Please call our office at [phone] if we can be of additional assistance.   Hollace Kinnier, DO  COVID-19 PATIENT SCREENING TOOL Asked  and negative response unless otherwise noted:  Have you had symptoms of covid, tested positive or been in contact with someone with symptoms/positive test in the past 5-10 days? No

## 2022-03-15 ENCOUNTER — Encounter (HOSPITAL_COMMUNITY): Payer: Self-pay

## 2022-03-15 ENCOUNTER — Inpatient Hospital Stay (HOSPITAL_COMMUNITY)
Admission: EM | Admit: 2022-03-15 | Discharge: 2022-03-20 | DRG: 184 | Disposition: A | Discharge status: Skilled Nursing Facility | Payer: Medicare Other | Attending: Internal Medicine | Admitting: Internal Medicine

## 2022-03-15 ENCOUNTER — Emergency Department (HOSPITAL_COMMUNITY): Payer: Medicare Other

## 2022-03-15 DIAGNOSIS — S40012A Contusion of left shoulder, initial encounter: Secondary | ICD-10-CM | POA: Diagnosis present

## 2022-03-15 DIAGNOSIS — F03918 Unspecified dementia, unspecified severity, with other behavioral disturbance: Secondary | ICD-10-CM | POA: Diagnosis present

## 2022-03-15 DIAGNOSIS — Z96652 Presence of left artificial knee joint: Secondary | ICD-10-CM | POA: Diagnosis present

## 2022-03-15 DIAGNOSIS — S42012A Anterior displaced fracture of sternal end of left clavicle, initial encounter for closed fracture: Secondary | ICD-10-CM | POA: Diagnosis not present

## 2022-03-15 DIAGNOSIS — S2249XA Multiple fractures of ribs, unspecified side, initial encounter for closed fracture: Secondary | ICD-10-CM | POA: Diagnosis present

## 2022-03-15 DIAGNOSIS — K219 Gastro-esophageal reflux disease without esophagitis: Secondary | ICD-10-CM | POA: Diagnosis present

## 2022-03-15 DIAGNOSIS — Z87448 Personal history of other diseases of urinary system: Secondary | ICD-10-CM

## 2022-03-15 DIAGNOSIS — E785 Hyperlipidemia, unspecified: Secondary | ICD-10-CM | POA: Diagnosis not present

## 2022-03-15 DIAGNOSIS — R2689 Other abnormalities of gait and mobility: Secondary | ICD-10-CM | POA: Diagnosis present

## 2022-03-15 DIAGNOSIS — S42022A Displaced fracture of shaft of left clavicle, initial encounter for closed fracture: Secondary | ICD-10-CM | POA: Diagnosis present

## 2022-03-15 DIAGNOSIS — R634 Abnormal weight loss: Secondary | ICD-10-CM | POA: Diagnosis present

## 2022-03-15 DIAGNOSIS — Z87891 Personal history of nicotine dependence: Secondary | ICD-10-CM | POA: Diagnosis not present

## 2022-03-15 DIAGNOSIS — R531 Weakness: Secondary | ICD-10-CM | POA: Diagnosis present

## 2022-03-15 DIAGNOSIS — Z79899 Other long term (current) drug therapy: Secondary | ICD-10-CM

## 2022-03-15 DIAGNOSIS — I110 Hypertensive heart disease with heart failure: Secondary | ICD-10-CM | POA: Diagnosis present

## 2022-03-15 DIAGNOSIS — N179 Acute kidney failure, unspecified: Secondary | ICD-10-CM | POA: Diagnosis present

## 2022-03-15 DIAGNOSIS — Z90710 Acquired absence of both cervix and uterus: Secondary | ICD-10-CM

## 2022-03-15 DIAGNOSIS — I10 Essential (primary) hypertension: Secondary | ICD-10-CM | POA: Diagnosis not present

## 2022-03-15 DIAGNOSIS — W19XXXA Unspecified fall, initial encounter: Secondary | ICD-10-CM | POA: Diagnosis present

## 2022-03-15 DIAGNOSIS — W1830XA Fall on same level, unspecified, initial encounter: Secondary | ICD-10-CM | POA: Diagnosis present

## 2022-03-15 DIAGNOSIS — R410 Disorientation, unspecified: Principal | ICD-10-CM

## 2022-03-15 DIAGNOSIS — R1310 Dysphagia, unspecified: Secondary | ICD-10-CM | POA: Diagnosis present

## 2022-03-15 DIAGNOSIS — I080 Rheumatic disorders of both mitral and aortic valves: Secondary | ICD-10-CM | POA: Diagnosis present

## 2022-03-15 DIAGNOSIS — Z885 Allergy status to narcotic agent status: Secondary | ICD-10-CM

## 2022-03-15 DIAGNOSIS — K838 Other specified diseases of biliary tract: Secondary | ICD-10-CM | POA: Diagnosis present

## 2022-03-15 DIAGNOSIS — R7401 Elevation of levels of liver transaminase levels: Secondary | ICD-10-CM | POA: Diagnosis present

## 2022-03-15 DIAGNOSIS — Z1152 Encounter for screening for COVID-19: Secondary | ICD-10-CM | POA: Diagnosis not present

## 2022-03-15 DIAGNOSIS — I5032 Chronic diastolic (congestive) heart failure: Secondary | ICD-10-CM | POA: Diagnosis not present

## 2022-03-15 DIAGNOSIS — D72829 Elevated white blood cell count, unspecified: Secondary | ICD-10-CM | POA: Diagnosis present

## 2022-03-15 DIAGNOSIS — R5383 Other fatigue: Secondary | ICD-10-CM | POA: Diagnosis present

## 2022-03-15 DIAGNOSIS — F0394 Unspecified dementia, unspecified severity, with anxiety: Secondary | ICD-10-CM | POA: Diagnosis present

## 2022-03-15 DIAGNOSIS — Z881 Allergy status to other antibiotic agents status: Secondary | ICD-10-CM

## 2022-03-15 DIAGNOSIS — S42002A Fracture of unspecified part of left clavicle, initial encounter for closed fracture: Secondary | ICD-10-CM

## 2022-03-15 DIAGNOSIS — K5900 Constipation, unspecified: Secondary | ICD-10-CM | POA: Diagnosis present

## 2022-03-15 DIAGNOSIS — Z6824 Body mass index (BMI) 24.0-24.9, adult: Secondary | ICD-10-CM | POA: Diagnosis not present

## 2022-03-15 DIAGNOSIS — S2242XA Multiple fractures of ribs, left side, initial encounter for closed fracture: Principal | ICD-10-CM | POA: Diagnosis present

## 2022-03-15 DIAGNOSIS — E860 Dehydration: Secondary | ICD-10-CM | POA: Diagnosis present

## 2022-03-15 DIAGNOSIS — F0390 Unspecified dementia without behavioral disturbance: Secondary | ICD-10-CM | POA: Diagnosis not present

## 2022-03-15 DIAGNOSIS — E7800 Pure hypercholesterolemia, unspecified: Secondary | ICD-10-CM | POA: Diagnosis present

## 2022-03-15 DIAGNOSIS — Z853 Personal history of malignant neoplasm of breast: Secondary | ICD-10-CM

## 2022-03-15 DIAGNOSIS — E876 Hypokalemia: Secondary | ICD-10-CM | POA: Diagnosis present

## 2022-03-15 DIAGNOSIS — Y92013 Bedroom of single-family (private) house as the place of occurrence of the external cause: Secondary | ICD-10-CM

## 2022-03-15 DIAGNOSIS — Z8261 Family history of arthritis: Secondary | ICD-10-CM

## 2022-03-15 DIAGNOSIS — Z8619 Personal history of other infectious and parasitic diseases: Secondary | ICD-10-CM

## 2022-03-15 DIAGNOSIS — Z8249 Family history of ischemic heart disease and other diseases of the circulatory system: Secondary | ICD-10-CM

## 2022-03-15 DIAGNOSIS — Z888 Allergy status to other drugs, medicaments and biological substances status: Secondary | ICD-10-CM

## 2022-03-15 DIAGNOSIS — Z825 Family history of asthma and other chronic lower respiratory diseases: Secondary | ICD-10-CM

## 2022-03-15 DIAGNOSIS — Z823 Family history of stroke: Secondary | ICD-10-CM

## 2022-03-15 DIAGNOSIS — F419 Anxiety disorder, unspecified: Secondary | ICD-10-CM | POA: Diagnosis present

## 2022-03-15 DIAGNOSIS — R627 Adult failure to thrive: Secondary | ICD-10-CM | POA: Diagnosis present

## 2022-03-15 DIAGNOSIS — R748 Abnormal levels of other serum enzymes: Secondary | ICD-10-CM | POA: Diagnosis present

## 2022-03-15 DIAGNOSIS — H9190 Unspecified hearing loss, unspecified ear: Secondary | ICD-10-CM | POA: Diagnosis present

## 2022-03-15 DIAGNOSIS — Z801 Family history of malignant neoplasm of trachea, bronchus and lung: Secondary | ICD-10-CM

## 2022-03-15 DIAGNOSIS — Y92009 Unspecified place in unspecified non-institutional (private) residence as the place of occurrence of the external cause: Secondary | ICD-10-CM | POA: Diagnosis not present

## 2022-03-15 LAB — CBC WITH DIFFERENTIAL/PLATELET
Abs Immature Granulocytes: 0.05 10*3/uL (ref 0.00–0.07)
Basophils Absolute: 0 10*3/uL (ref 0.0–0.1)
Basophils Relative: 0 %
Eosinophils Absolute: 0 10*3/uL (ref 0.0–0.5)
Eosinophils Relative: 0 %
HCT: 42.9 % (ref 36.0–46.0)
Hemoglobin: 14.5 g/dL (ref 12.0–15.0)
Immature Granulocytes: 0 %
Lymphocytes Relative: 11 %
Lymphs Abs: 1.5 10*3/uL (ref 0.7–4.0)
MCH: 29.5 pg (ref 26.0–34.0)
MCHC: 33.8 g/dL (ref 30.0–36.0)
MCV: 87.4 fL (ref 80.0–100.0)
Monocytes Absolute: 0.8 10*3/uL (ref 0.1–1.0)
Monocytes Relative: 6 %
Neutro Abs: 10.8 10*3/uL — ABNORMAL HIGH (ref 1.7–7.7)
Neutrophils Relative %: 83 %
Platelets: 297 10*3/uL (ref 150–400)
RBC: 4.91 MIL/uL (ref 3.87–5.11)
RDW: 13.6 % (ref 11.5–15.5)
WBC: 13.2 10*3/uL — ABNORMAL HIGH (ref 4.0–10.5)
nRBC: 0 % (ref 0.0–0.2)

## 2022-03-15 LAB — COMPREHENSIVE METABOLIC PANEL
ALT: 56 U/L — ABNORMAL HIGH (ref 0–44)
AST: 64 U/L — ABNORMAL HIGH (ref 15–41)
Albumin: 4.6 g/dL (ref 3.5–5.0)
Alkaline Phosphatase: 83 U/L (ref 38–126)
Anion gap: 12 (ref 5–15)
BUN: 30 mg/dL — ABNORMAL HIGH (ref 8–23)
CO2: 31 mmol/L (ref 22–32)
Calcium: 9.3 mg/dL (ref 8.9–10.3)
Chloride: 97 mmol/L — ABNORMAL LOW (ref 98–111)
Creatinine, Ser: 1.09 mg/dL — ABNORMAL HIGH (ref 0.44–1.00)
GFR, Estimated: 47 mL/min — ABNORMAL LOW (ref >60)
Glucose, Bld: 144 mg/dL — ABNORMAL HIGH (ref 70–99)
Potassium: 3.3 mmol/L — ABNORMAL LOW (ref 3.5–5.1)
Sodium: 140 mmol/L (ref 135–145)
Total Bilirubin: 1.6 mg/dL — ABNORMAL HIGH (ref 0.3–1.2)
Total Protein: 8.2 g/dL — ABNORMAL HIGH (ref 6.5–8.1)

## 2022-03-15 LAB — RESP PANEL BY RT-PCR (RSV, FLU A&B, COVID)  RVPGX2
Influenza A by PCR: NEGATIVE
Influenza B by PCR: NEGATIVE
Resp Syncytial Virus by PCR: NEGATIVE
SARS Coronavirus 2 by RT PCR: NEGATIVE

## 2022-03-15 LAB — URINALYSIS, ROUTINE W REFLEX MICROSCOPIC
Bacteria, UA: NONE SEEN
Bilirubin Urine: NEGATIVE
Glucose, UA: NEGATIVE mg/dL
Hgb urine dipstick: NEGATIVE
Ketones, ur: 20 mg/dL — AB
Leukocytes,Ua: NEGATIVE
Nitrite: NEGATIVE
Protein, ur: 30 mg/dL — AB
Specific Gravity, Urine: 1.036 — ABNORMAL HIGH (ref 1.005–1.030)
pH: 5 (ref 5.0–8.0)

## 2022-03-15 LAB — LIPASE, BLOOD: Lipase: 141 U/L — ABNORMAL HIGH (ref 11–51)

## 2022-03-15 MED ORDER — KCL IN DEXTROSE-NACL 20-5-0.9 MEQ/L-%-% IV SOLN
INTRAVENOUS | Status: DC
  Filled 2022-03-15: qty 1000

## 2022-03-15 MED ORDER — IOHEXOL 300 MG/ML  SOLN
100.0000 mL | Freq: Once | INTRAMUSCULAR | Status: AC | PRN
  Administered 2022-03-15: 100 mL via INTRAVENOUS

## 2022-03-15 MED ORDER — SODIUM CHLORIDE 0.9 % IV BOLUS
500.0000 mL | Freq: Once | INTRAVENOUS | Status: AC
  Administered 2022-03-15: 500 mL via INTRAVENOUS

## 2022-03-15 MED ORDER — ONDANSETRON HCL 4 MG/2ML IJ SOLN
4.0000 mg | Freq: Four times a day (QID) | INTRAMUSCULAR | Status: DC | PRN
  Administered 2022-03-20: 4 mg via INTRAVENOUS
  Filled 2022-03-15: qty 2

## 2022-03-15 MED ORDER — FENTANYL CITRATE PF 50 MCG/ML IJ SOSY
50.0000 ug | PREFILLED_SYRINGE | Freq: Once | INTRAMUSCULAR | Status: AC
  Administered 2022-03-15: 50 ug via INTRAVENOUS
  Filled 2022-03-15: qty 1

## 2022-03-15 MED ORDER — HYDRALAZINE HCL 20 MG/ML IJ SOLN
5.0000 mg | INTRAMUSCULAR | Status: DC | PRN
  Administered 2022-03-16: 5 mg via INTRAVENOUS
  Filled 2022-03-15 (×2): qty 1

## 2022-03-15 MED ORDER — ENOXAPARIN SODIUM 30 MG/0.3ML IJ SOSY
30.0000 mg | PREFILLED_SYRINGE | INTRAMUSCULAR | Status: DC
  Administered 2022-03-15: 30 mg via SUBCUTANEOUS
  Filled 2022-03-15: qty 0.3

## 2022-03-15 MED ORDER — LACTATED RINGERS IV SOLN: INTRAVENOUS | Status: DC

## 2022-03-15 MED ORDER — BISOPROLOL FUMARATE 5 MG PO TABS
5.0000 mg | ORAL_TABLET | Freq: Every day | ORAL | Status: DC
  Administered 2022-03-15 – 2022-03-20 (×5): 5 mg via ORAL
  Filled 2022-03-15 (×7): qty 1

## 2022-03-15 MED ORDER — DONEPEZIL HCL 10 MG PO TABS
5.0000 mg | ORAL_TABLET | Freq: Every day | ORAL | Status: DC
  Administered 2022-03-15 – 2022-03-19 (×5): 5 mg via ORAL
  Filled 2022-03-15 (×5): qty 1

## 2022-03-15 MED ORDER — ONDANSETRON HCL 4 MG PO TABS: 4.0000 mg | ORAL_TABLET | Freq: Four times a day (QID) | ORAL | Status: DC | PRN

## 2022-03-15 NOTE — ED Triage Notes (Signed)
Pt arrived via EMS, from home, lives alone but family showed up today, concerned due to lethargy. Has hx of dementia, but has home care help with ADLS at home

## 2022-03-15 NOTE — ED Provider Notes (Signed)
Emergency Department Provider Note   I have reviewed the triage vital signs and the nursing notes.   HISTORY  Chief Complaint Fatigue   HPI [name] Morton is a 89 y.o. female with past medical history reviewed below including dementia presents to the emergency department with fatigue and worsening confusion according to family.  Patient lives alone with family assistance.  They came to visit her today and found her to be more fatigued and confused compared to her baseline.  Patient denies any pain in any location.  She does not recall a fall but history is limited by her underlying dementia. Level 5 caveat applies.  Daughter at bedside reports increased confusion over the past week which seem to acutely worsen yesterday.  No med changes.  She came to the apartment and found curtains ripped down from the wall as if she had fallen to the ground.  She did not witness the fall and the patient had made her way back upstairs to get in bed.  She checked over her back but did not find any bruising but admits to the bruising to her left shoulder, which she now sees.    Past Medical History:  Diagnosis Date   Breast cancer ([place]) 1997   right - tx with lumpectomty & radiation   Burning sensation of feet    Per [facility]    Chronic anxiety    Chronic kidney disease    Per [facility]    Cognitive impairment    Per [facility]    Diverticulosis    Dyslipidemia    Fluid retention    Per [facility]    Gait difficulty    GERD (gastroesophageal reflux disease)    RARE - NO MEDICATIONS   Hearing impairment    BILATERAL HEARING AIDS   Heart murmur    Per [facility]    High cholesterol    Per [facility]    History of nuclear stress test 11/2009   dipyridamole; no evidence of ischemia, normal, low risk    History of shingles 1998   NO RESIDUAL PROBLEMS   Hypertension    SEES CARDIOLOGIST DR. DAVID HARDING FOR  HYPERTENSION    Insomnia    Lymphedema of arm    RIGHT    Mitral and aortic regurgitation    OA (osteoarthritis)    OA AND PAIN LEFT KNEE   Osteopenia    Scoliosis    Per [facility]    Shortness of breath    WHEN CLIMBING STEPS AT MY HOME--OTHERWISE OK   Sinus problem    Sleep difficulties    ALWAYS HAD SLEEPING PROBLEMS - MEDICATION HELPS PT SLEEP ABOUT 3 HOURS   SOB (shortness of breath)    Per New Patient Packet[facility], hospitalized 2018     Review of Systems  Level 5 caveat: Dementia   ____________________________________________   PHYSICAL EXAM:  VITAL SIGNS: ED Triage Vitals [03/15/22 1521]  Enc Vitals Group     BP (!) 154/57     Pulse Rate 61     Resp (!) 22     Temp 97.8 F (36.6 C)     Temp Source Oral     SpO2 92 %   Constitutional: Alert but confused. Conversational. Well appearing and in no acute distress. Eyes: Conjunctivae are normal. Head: Atraumatic. Nose: No congestion/rhinnorhea. Mouth/Throat: Mucous membranes are moist. Neck: No stridor.   Cardiovascular: Normal rate, regular rhythm. Good  peripheral circulation. Grossly normal heart sounds.   Respiratory: Normal respiratory effort.  No retractions. Lungs CTAB. Gastrointestinal: Soft and nontender. No distention.  Musculoskeletal: Pain with palpation along the left clavicle and anterior shoulder.  Pain with range of motion of that shoulder.  Normal range of motion of the bilateral hips, knees, ankles. Neurologic:  Normal speech and language. No gross focal neurologic deficits are appreciated.  Skin:  Skin is warm and dry.  Bruising noted over the left anterior shoulder without skin breakdown or tenting.   ____________________________________________   LABS (all labs ordered are listed, but only abnormal results are displayed)  Labs Reviewed  COMPREHENSIVE METABOLIC PANEL - Abnormal; Notable for the following components:      Result Value   Potassium 3.3 (*)    Chloride 97 (*)     Glucose, Bld 144 (*)    BUN 30 (*)    Creatinine, Ser 1.09 (*)    Total Protein 8.2 (*)    AST 64 (*)    ALT 56 (*)    Total Bilirubin 1.6 (*)    GFR, Estimated 47 (*)    All other components within normal limits  LIPASE, BLOOD - Abnormal; Notable for the following components:   Lipase 141 (*)    All other components within normal limits  CBC WITH DIFFERENTIAL/PLATELET - Abnormal; Notable for the following components:   WBC 13.2 (*)    Neutro Abs 10.8 (*)    All other components within normal limits  URINALYSIS, ROUTINE W REFLEX MICROSCOPIC - Abnormal; Notable for the following components:   Specific Gravity, Urine 1.036 (*)    Ketones, ur 20 (*)    Protein, ur 30 (*)    All other components within normal limits  RESP PANEL BY RT-PCR (RSV, FLU A&B, COVID)  RVPGX2  URINE CULTURE  CBC  CREATININE, SERUM  COMPREHENSIVE METABOLIC PANEL  CBC   ____________________________________________  EKG   EKG Interpretation  Date/Time:  Sunday March 15 2022 15:36:59 EST Ventricular Rate:  61 PR Interval:  126 QRS Duration: 89 QT Interval:  398 QTC Calculation: 401 R Axis:   70 Text Interpretation: Sinus or ectopic atrial rhythm Borderline T abnormalities, lateral leads Confirmed by Nanda Quinton [phone]) on 03/15/2022 5:08:40 PM        ____________________________________________  RADIOLOGY  CT CHEST ABDOMEN PELVIS W CONTRAST  Result Date: 03/15/2022 CLINICAL DATA:  Increased lethargy and recent fall, initial encounter EXAM: CT CHEST, ABDOMEN, AND PELVIS WITH CONTRAST TECHNIQUE: Multidetector CT imaging of the chest, abdomen and pelvis was performed following the standard protocol during bolus administration of intravenous contrast. RADIATION DOSE REDUCTION: This exam was performed according to the departmental dose-optimization program which includes automated exposure control, adjustment of the mA and/or kV according to patient size and/or use of iterative reconstruction  technique. CONTRAST:  116m OMNIPAQUE IOHEXOL 300 MG/ML  SOLN COMPARISON:  Chest x-ray from earlier in the same day, CT from 03/31/2014 FINDINGS: CT CHEST FINDINGS Cardiovascular: Atherosclerotic calcifications are noted. No aneurysmal dilatation or dissection is seen. Coronary calcifications are noted. Pulmonary artery is within normal limits with no large central pulmonary embolus. Timing was not performed for embolus evaluation. Heart is at the upper limits of normal in size. Mediastinum/Nodes: Thoracic inlet is within normal limits. The esophagus is unremarkable as visualized. No hilar or mediastinal adenopathy is noted. Minimal pericardial fluid is noted similar to that seen on prior CT from 2016. Lungs/Pleura: Minimal scarring is noted in the bases bilaterally. No focal infiltrate  or effusion is seen. No pneumothorax is noted. Musculoskeletal: Midshaft left clavicular fracture is noted similar to that seen on prior plain film examination. Mild degenerative changes of the thoracic spine are noted. No compression deformity is seen. There are left-sided rib fractures identified involving the anterior third through fifth ribs as well as the posterolateral fourth through sixth ribs. No complicating factors are noted. CT ABDOMEN PELVIS FINDINGS Hepatobiliary: Gallbladder is well distended. The common bile duct and intrahepatic biliary tree are prominent. The distal common bile duct measures 12 mm with some distal tapering identified. Mild fullness of the proximal pancreatic duct is noted as well. These changes are stable from the prior CT of 2016 and have been evaluated on prior MRI without evidence of common bile duct stone identified. No new stone is identified. Pancreas: Lipoma is noted in the pancreatic head stable from the prior exam. The peripheral pancreatic duct is within normal limits. Spleen: Normal in size without focal abnormality. Adrenals/Urinary Tract: Adrenal glands are within normal limits. Kidneys  demonstrate a normal enhancement pattern bilaterally. No renal calculi or obstructive changes are noted. There is a mildly hyperdense cyst identified within the left kidney measuring 2.5 cm in greatest dimension. It shows no significant enhancement on delayed images and is increased in size from a subcentimeter cyst in 2016. No obstructive changes are seen. The bladder is well distended. Stomach/Bowel: Scattered diverticular changes noted within the colon. No findings to suggest diverticulitis are seen. The appendix has been surgically. Small bowel stomach are within normal limits. Vascular/Lymphatic: Aortic atherosclerosis. No enlarged abdominal or pelvic lymph nodes. Reproductive: Status post hysterectomy. No adnexal masses. Other: No abdominal wall hernia or abnormality. No abdominopelvic ascites. Musculoskeletal: No acute or significant osseous findings. Chronic L1 compression deformity is noted. Changes consistent with prior fracture of the left L3 transverse process with healing. IMPRESSION: CT of the chest: Multiple left-sided rib fractures as described. No pneumothorax is noted. Midshaft left clavicular fracture without complicating factors. No evidence of pulmonary embolus. CT of the abdomen and pelvis: Prominent gallbladder with evidence of intrahepatic and extrahepatic biliary ductal dilatation. The overall appearance is stable from 2016. This has been previously evaluated without evidence of distal common bile duct stone. This is likely the steady-state for the patient's given age of 59. Diverticulosis without diverticulitis. Hyperdense cyst within the left kidney measuring 2.5 cm. Nonemergent renal ultrasound could be performed to assess the nature of this lesion Electronically Signed   By: Inez Catalina M.D.   On: 03/15/2022 19:20   CT Head Wo Contrast  Result Date: 03/15/2022 CLINICAL DATA:  Fall several days ago.  Altered mental status. EXAM: CT HEAD WITHOUT CONTRAST TECHNIQUE: Contiguous axial  images were obtained from the base of the skull through the vertex without intravenous contrast. RADIATION DOSE REDUCTION: This exam was performed according to the departmental dose-optimization program which includes automated exposure control, adjustment of the mA and/or kV according to patient size and/or use of iterative reconstruction technique. COMPARISON:  05/22/2021 FINDINGS: Brain: No evidence of intracranial hemorrhage, acute infarction, hydrocephalus, extra-axial collection, or mass lesion/mass effect. Mild diffuse cerebral atrophy and moderate to severe chronic small vessel disease show no significant change in appearance. Vascular:  No hyperdense vessel or other acute findings. Skull: No evidence of fracture or other significant bone abnormality. Sinuses/Orbits:  No acute findings. Other: None. IMPRESSION: No acute intracranial abnormality. Stable cerebral atrophy and chronic small vessel disease. Electronically Signed   By: Marlaine Hind M.D.   On: 03/15/2022 16:28  DG Chest 2 View  Result Date: 03/15/2022 CLINICAL DATA:  Chest wall bruising, dementia, left shoulder pain EXAM: CHEST - 2 VIEW COMPARISON:  05/22/2021 chest radiograph. FINDINGS: Surgical clips overlie the right axilla and right upper abdomen. Stable cardiomediastinal silhouette with mild cardiomegaly. No pneumothorax. Chronic moderate elevation of the right hemidiaphragm. New small left pleural effusion. No right pleural effusion. No pulmonary edema. Chronic right lung base atelectasis. New mild compressive lateral left lung base atelectasis. Left mid clavicle shaft fracture with displacement and overriding. Lateral left fourth and fifth rib fractures. These fractures are new. IMPRESSION: 1. Left mid clavicle shaft fracture with displacement and overriding. Lateral left fourth and fifth rib fractures. These fractures are new from 05/22/2021 chest radiograph and potentially acute. 2. No pneumothorax. 3. New small left pleural effusion.  4. Chronic moderate elevation of the right hemidiaphragm with chronic right lung base atelectasis. Electronically Signed   By: Ilona Sorrel M.D.   On: 03/15/2022 15:53   DG Shoulder Left  Result Date: 03/15/2022 CLINICAL DATA:  Dementia, chest wall bruising, shoulder pain EXAM: LEFT SHOULDER - 2+ VIEW COMPARISON:  05/22/2021 chest radiograph. FINDINGS: Left mid clavicular shaft fracture with prominent 4 cm overriding of the fracture fragments and 1.5 cm inferior displacement of the lateral fracture fragment, new. New lateral left fourth, fifth and sixth rib fractures with displaced left fifth rib fracture. No glenohumeral dislocation. No evidence of acromioclavicular separation. No suspicious focal osseous lesions. IMPRESSION: 1. Displaced and overriding left mid clavicular shaft fracture, new since 05/22/2021 chest radiograph and potentially acute. 2. Lateral left fourth, fifth and sixth rib fractures with displaced left fifth rib fracture, new since 05/22/2021 chest radiograph and potentially acute. Electronically Signed   By: Ilona Sorrel M.D.   On: 03/15/2022 15:50    ____________________________________________   PROCEDURES  Procedure(s) performed:   Procedures  None  ____________________________________________   INITIAL IMPRESSION / ASSESSMENT AND PLAN / ED COURSE  Pertinent labs & imaging results that were available during my care of the patient were reviewed by me and considered in my medical decision making (see chart for details).   This patient is Presenting for Evaluation of AMS, which does require a range of treatment options, and is a complaint that involves a high risk of morbidity and mortality.  The Differential Diagnoses includes but is not exclusive to alcohol, illicit or prescription medications, intracranial pathology such as stroke, intracerebral hemorrhage, fever or infectious causes including sepsis, hypoxemia, uremia, trauma, endocrine related disorders such as  diabetes, hypoglycemia, thyroid-related diseases, etc.   Critical Interventions-    Medications  lactated ringers infusion ( Intravenous New Bag/Given 03/15/22 2025)  bisoprolol (ZEBETA) tablet 5 mg (5 mg Oral Given 03/15/22 2231)  donepezil (ARICEPT) tablet 5 mg (5 mg Oral Given 03/15/22 2231)  enoxaparin (LOVENOX) injection 30 mg (30 mg Subcutaneous Given 03/15/22 2232)  dextrose 5 % and 0.9 % NaCl with KCl 20 mEq/L infusion ( Intravenous New Bag/Given 03/15/22 2233)  ondansetron (ZOFRAN) tablet 4 mg (has no administration in time range)    Or  ondansetron (ZOFRAN) injection 4 mg (has no administration in time range)  hydrALAZINE (APRESOLINE) injection 5 mg (has no administration in time range)  sodium chloride 0.9 % bolus 500 mL (0 mLs Intravenous Stopped 03/15/22 1904)  fentaNYL (SUBLIMAZE) injection 50 mcg (50 mcg Intravenous Given 03/15/22 1625)  iohexol (OMNIPAQUE) 300 MG/ML solution 100 mL (100 mLs Intravenous Contrast Given 03/15/22 1840)    Reassessment after intervention: Pain improved.  I did obtain Additional Historical Information from daughter, as the patient is confused.    Clinical Laboratory Tests Ordered, included CBC with leukocytosis to 13.2.  LFTs mildly elevated along with bilirubin and lipase of 141.   Radiologic Tests Ordered, included CXR, x-ray shoulder, and CT head. I independently interpreted the images and agree with radiology interpretation.   Cardiac Monitor Tracing which shows NSR.   Social Determinants of Health Risk lives at home alone with family support.   Consult complete with Hospitalist, plan for admit.   Medical Decision Making: Summary:  Patient presents to the emergency department for evaluation of increased fatigue and confusion.  On exam she has bruising to the left clavicle and pain in the left shoulder with palpation.  X-ray shows clavicle fracture as well as rib fractures new from imaging in March 2023.  Unclear on chronicity but given  continued bruising feel that at least the clavicle fracture is new. No focal neuro deficits.   Reevaluation with update and discussion with patient and daughter. No PNX or other complication for rib fractures. AMS began prior to fall. Plan for admit.   Patient's presentation is most consistent with acute presentation with potential threat to life or bodily function.   Disposition: admit  ____________________________________________  FINAL CLINICAL IMPRESSION(S) / ED DIAGNOSES  Final diagnoses:  Disorientation  Weakness  Closed displaced fracture of left clavicle, unspecified part of clavicle, initial encounter  Closed fracture of multiple ribs of left side, initial encounter    Note:  This document was prepared using Dragon voice recognition software and may include unintentional dictation errors.  Nanda Quinton, MD, [facility]    [place]ug[place], [place], MD 03/15/22 [phone]

## 2022-03-15 NOTE — H&P (Signed)
History and Physical    Patient: [name] Morton VEL:[id number] DOB: [birth year] DOA: 03/15/2022 DOS: the patient was seen and examined on 03/15/2022 PCP: Gayland Curry, DO  Patient coming from: Home  Chief Complaint:  Chief Complaint  Patient presents with   Fatigue   HPI: [name] Morton is a 89 y.o. female with medical history significant of right breast cancer, chronic kidney disease stage III, dementia, hypertension, hyperlipidemia, GERD, who apparently lives at home alone.  Patient was brought in by daughter after worsening altered mental status.  She has been failing to thrive at home.  While in the ER she was noted to have left chest wall bruising.  Further evaluation showed left clavicular fracture with multiple left-sided rib fractures.  Patient appears to be in pain with elevated blood pressure.  Daughter did not observe for as patient lives alone.  At this point a sling has been placed.  Patient being admitted for further evaluation management and possible placement.  Daughter has reported that patient has been having poor oral intake.  She lives mainly on pizza, Dr. Malachi Bonds for the most part.  Not taking a lot of fluids.  Review of Systems: As mentioned in the history of present illness. All other systems reviewed and are negative. Past Medical History:  Diagnosis Date   Breast cancer ([place]) 1997   right - tx with lumpectomty & radiation   Burning sensation of feet    Per New Patient Packet-[facility]    Chronic anxiety    Chronic kidney disease    Per New Patient Packet-[facility]    Cognitive impairment    Per New Patient Packet-[facility]    Diverticulosis    Dyslipidemia    Fluid retention    Per New Patient Packet-[facility]    Gait difficulty    GERD (gastroesophageal reflux disease)    RARE - NO MEDICATIONS   Hearing impairment    BILATERAL HEARING AIDS   Heart murmur    Per New Patient Packet-[facility]    High cholesterol    Per New Patient Packet-[facility]    History of nuclear stress test  11/2009   dipyridamole; no evidence of ischemia, normal, low risk    History of shingles 1998   NO RESIDUAL PROBLEMS   Hypertension    SEES CARDIOLOGIST DR. DAVID HARDING FOR HYPERTENSION    Insomnia    Lymphedema of arm    RIGHT    Mitral and aortic regurgitation    OA (osteoarthritis)    OA AND PAIN LEFT KNEE   Osteopenia    Scoliosis    Per New Patient Packet-[facility]    Shortness of breath    WHEN CLIMBING STEPS AT MY HOME--OTHERWISE OK   Sinus problem    Sleep difficulties    ALWAYS HAD SLEEPING PROBLEMS - MEDICATION HELPS PT SLEEP ABOUT 3 HOURS   SOB (shortness of breath)    Per New Patient Packet-[facility], hospitalized 2018    Past Surgical History:  Procedure Laterality Date   ABDOMINAL HYSTERECTOMY  [place]   BREAST LUMPECTOMY Right 1997   COLONOSCOPY  2014   Dr.Sam Penelope Coop, Per New Patient Packet-PSC    EYE SURGERY     BILATERAL CATARACT EXTRACTIONS AND LENS IMPLANTS   NM MYOVIEW LTD  08/2016   Hyperdynamic LV function EF 65%. No ischemia or infarction. LOW RISK   REPLACEMENT TOTAL KNEE  2002   ROTATOR CUFF REPAIR Right  12/2009   Dr. Lenna Sciara. [place]    TOTAL KNEE ARTHROPLASTY Left 05/01/2013   Procedure: LEFT TOTAL KNEE ARTHROPLASTY;  Surgeon: Gearlean Alf, MD;  Location: [facility];  Service: Orthopedics;  Laterality: Left;   TRANSTHORACIC ECHOCARDIOGRAM  05/1994   trace mitral/tricuspic/aortic inusff; hyperkinetic LV   TRANSTHORACIC ECHOCARDIOGRAM  08/2016   Normal LV size. EF 65-70%. GR 1 DD. (Normal for age). Mild aortic stenosis   Social History:  reports that she has quit smoking. Her smoking use included cigarettes. She has never used smokeless tobacco. She reports that she does not drink alcohol and does not use drugs.  Allergies  Allergen Reactions   Benadryl [Diphenhydramine Hcl (Sleep)]     Makes me hyper    Cardizem [Diltiazem Hcl]     Doesn't remember. Dr Rex Kras told patient not to take.    Ceftin [Cefuroxime  Axetil] Other (See Comments)    Stomach Issues    Epinephrine Other (See Comments)    unknown   Oxycodone Hcl     Loss of appetite: sores on tongue and mouth   Tetanus Toxoids Other (See Comments)    Unknown    Tramadol Hcl     Loss of appetite: dry mouth/sores on tongue and mouth   Vistaril [Hydroxyzine] Other (See Comments)    unknown   Vytorin [Ezetimibe-Simvastatin]     Not comfortable taking it. Doesn't remember reaction.     Family History  Problem Relation Age of Onset   Heart attack Mother    Arthritis Mother    Dementia Mother    Heart Problems Father    Stroke Father    Heart Problems Maternal Grandmother    Heart Problems Maternal Grandfather    Asthma Maternal Grandfather    Heart attack Brother 14   Heart disease Brother        CABG   Lung cancer Sister    Emphysema Sister    Cancer Sister    Narcolepsy Child    Arthritis Child    High blood pressure Child    Alcoholism Brother    Post-traumatic stress disorder Brother    COPD Sister    Seizures Sister    Heart Problems Sister     Prior to Admission medications   Medication Sig Start Date End Date Taking? Authorizing Provider  acetaminophen (TYLENOL) 650 MG CR tablet Take 650 mg by mouth daily.    [provider]  amLODipine-benazepril (LOTREL) 10-20 MG capsule TAKE 1 CAPSULE BY MOUTH DAILY. 07/03/20 07/03/21  Wardell Honour, MD  bisoprolol (ZEBETA) 5 MG tablet TAKE 1 TABLET DAILY Patient taking differently: Take 5 mg by mouth daily. 04/12/20   Reed, [name], DO  donepezil (ARICEPT) 5 MG tablet TAKE 1 TABLET AT BEDTIME FOR MEMORY LOSS Patient taking differently: Take 5 mg by mouth at bedtime. 04/17/20   Reed, [name], DO  DULoxetine 40 MG CPEP Take 40 mg by mouth daily. 11/06/21   Reed, [name], DO  furosemide (LASIX) 40 MG tablet TAKE 1 TABLET DAILY 11/06/21   Reed, [name], DO  gabapentin (NEURONTIN) 300 MG capsule Take 1 capsule (300 mg total) by mouth at bedtime. 11/06/21   Reed, [name],  DO  potassium chloride SA (KLOR-CON) 20 MEQ tablet Take 1 tablet (20 mEq total) by mouth as needed. Only when takes Lasix 05/07/20   Gayland Curry, DO    Physical Exam: Vitals:   03/15/22 1720 03/15/22 1901 03/15/22 1934 03/15/22 2055  BP: [name] Kitchen)  148/66  (!) 161/84 (!) 160/96  Pulse: 62 75 74 70  Resp: 16 (!) '21 18 20  '$ Temp:  98 F (36.7 C)    TempSrc:  Oral    SpO2: 100%  100% 100%   Constitutional: Confused, anxious but not agitated, calm, comfortable Eyes: PERRL, lids and conjunctivae normal ENMT: Mucous membranes are dry t. Posterior pharynx clear of any exudate or lesions.Normal dentition.  Neck: normal, supple, no masses, no thyromegaly Respiratory: clear to auscultation bilaterally, no wheezing, no crackles. Normal respiratory effort. No accessory muscle use.  Cardiovascular: Regular rate and rhythm, no murmurs / rubs / gallops. No extremity edema. 2+ pedal pulses. No carotid bruits.  Abdomen: no tenderness, no masses palpated. No hepatosplenomegaly. Bowel sounds positive.  Musculoskeletal: Left upper extremity bruising, left clavicular fracture notable  Skin: Multiple areas of bruising on the chest wall and lower extremity on the left no rashes, lesions, ulcers. No induration Neurologic: CN 2-12 grossly intact. Sensation intact, DTR normal. Strength 5/5 in all 4.  Psychiatric: Confused, disoriented  Data Reviewed:  Sodium 140 potassium 3.3 chloride 97 CO2 31, glucose 144, BUN 30 creatinine 1.09, lipase is 141 AST 64 ALT 56 total protein 8.2 bilirubin 1.6.  White count 13.2.  Acute viral screen is negative.  Urinalysis negative.  CT chest abdomen pelvis showed multiple left-sided rib fractures.  Also midshaft left clavicular fracture.  Assessment and Plan:  #1 status post fall: Patient appears to have gait instability in the setting of dementia.  She is failing to thrive alone at home.  More than likely will need placement.  Will get PT and OT consultation.  Supportive  care.  #2 left clavicular and rib fractures: Incentive spirometry, pain control, PT and OT  #3 dementia: Continue Aricept on home regimen.  #4 essential hypertension: Blood pressure markedly elevated.  Probably due to pain.  Resume her beta-blocker and add hydralazine as needed  #5 diastolic dysfunction: Patient appeared dehydrated.  Continue to hydrate.  #6 AKI: Due to dehydration and poor oral intake.  We will hydrate.  #7 dehydration: Hydrate and monitor closely.  #8 hyperlipidemia: Continue home regimen  #9 hypokalemia: Probably poor oral intake.  Hydrate and monitor.    Advance Care Planning:   Code Status: Full Code   Consults: PT and OT as well as case manager  Family Communication: Daughter in the room  Severity of Illness: The appropriate patient status for this patient is INPATIENT. Inpatient status is judged to be reasonable and necessary in order to provide the required intensity of service to ensure the patient's safety. The patient's presenting symptoms, physical exam findings, and initial radiographic and laboratory data in the context of their chronic comorbidities is felt to place them at high risk for further clinical deterioration. Furthermore, it is not anticipated that the patient will be medically stable for discharge from the hospital within 2 midnights of admission.   * I certify that at the point of admission it is my clinical judgment that the patient will require inpatient hospital care spanning beyond 2 midnights from the point of admission due to high intensity of service, high risk for further deterioration and high frequency of surveillance required.*  AuthorBarbette Merino, MD 03/15/2022 9:38 PM  For on call review [email or web address].

## 2022-03-16 ENCOUNTER — Other Ambulatory Visit: Payer: Self-pay

## 2022-03-16 DIAGNOSIS — R410 Disorientation, unspecified: Secondary | ICD-10-CM

## 2022-03-16 DIAGNOSIS — R531 Weakness: Secondary | ICD-10-CM | POA: Diagnosis not present

## 2022-03-16 DIAGNOSIS — S42002A Fracture of unspecified part of left clavicle, initial encounter for closed fracture: Secondary | ICD-10-CM

## 2022-03-16 DIAGNOSIS — S2242XA Multiple fractures of ribs, left side, initial encounter for closed fracture: Secondary | ICD-10-CM | POA: Diagnosis not present

## 2022-03-16 LAB — COMPREHENSIVE METABOLIC PANEL
ALT: 49 U/L — ABNORMAL HIGH (ref 0–44)
AST: 53 U/L — ABNORMAL HIGH (ref 15–41)
Albumin: 3.5 g/dL (ref 3.5–5.0)
Alkaline Phosphatase: 73 U/L (ref 38–126)
Anion gap: 9 (ref 5–15)
BUN: 24 mg/dL — ABNORMAL HIGH (ref 8–23)
CO2: 29 mmol/L (ref 22–32)
Calcium: 9 mg/dL (ref 8.9–10.3)
Chloride: 103 mmol/L (ref 98–111)
Creatinine, Ser: 0.85 mg/dL (ref 0.44–1.00)
GFR, Estimated: >60 mL/min (ref >60)
Glucose, Bld: 157 mg/dL — ABNORMAL HIGH (ref 70–99)
Potassium: 3.5 mmol/L (ref 3.5–5.1)
Sodium: 141 mmol/L (ref 135–145)
Total Bilirubin: 1.1 mg/dL (ref 0.3–1.2)
Total Protein: 6.4 g/dL — ABNORMAL LOW (ref 6.5–8.1)

## 2022-03-16 LAB — CBC
HCT: 41.6 % (ref 36.0–46.0)
Hemoglobin: 13.7 g/dL (ref 12.0–15.0)
MCH: 29.1 pg (ref 26.0–34.0)
MCHC: 32.9 g/dL (ref 30.0–36.0)
MCV: 88.5 fL (ref 80.0–100.0)
Platelets: 240 10*3/uL (ref 150–400)
RBC: 4.7 MIL/uL (ref 3.87–5.11)
RDW: 13.7 % (ref 11.5–15.5)
WBC: 10.1 10*3/uL (ref 4.0–10.5)
nRBC: 0 % (ref 0.0–0.2)

## 2022-03-16 LAB — LIPASE, BLOOD: Lipase: 285 U/L — ABNORMAL HIGH (ref 11–51)

## 2022-03-16 LAB — AMYLASE: Amylase: 416 U/L — ABNORMAL HIGH (ref 28–100)

## 2022-03-16 MED ORDER — METHOCARBAMOL 1000 MG/10ML IJ SOLN
500.0000 mg | Freq: Three times a day (TID) | INTRAVENOUS | Status: DC
  Administered 2022-03-16 – 2022-03-17 (×3): 500 mg via INTRAVENOUS
  Filled 2022-03-16 (×3): qty 500

## 2022-03-16 MED ORDER — ACETAMINOPHEN 500 MG PO TABS
1000.0000 mg | ORAL_TABLET | Freq: Three times a day (TID) | ORAL | Status: AC
  Administered 2022-03-16 – 2022-03-19 (×7): 1000 mg via ORAL
  Filled 2022-03-16 (×7): qty 2

## 2022-03-16 MED ORDER — LIDOCAINE 5 % EX PTCH
1.0000 | MEDICATED_PATCH | CUTANEOUS | Status: DC
  Administered 2022-03-16 – 2022-03-20 (×4): 1 via TRANSDERMAL
  Filled 2022-03-16 (×5): qty 1

## 2022-03-16 MED ORDER — MORPHINE SULFATE (PF) 2 MG/ML IV SOLN
2.0000 mg | INTRAVENOUS | Status: DC | PRN
  Administered 2022-03-16 (×2): 2 mg via INTRAVENOUS
  Filled 2022-03-16 (×3): qty 1

## 2022-03-16 MED ORDER — HYDROCODONE-ACETAMINOPHEN 5-325 MG PO TABS
1.0000 | ORAL_TABLET | ORAL | Status: DC | PRN
  Administered 2022-03-16 – 2022-03-20 (×7): 1 via ORAL
  Filled 2022-03-16 (×9): qty 1

## 2022-03-16 MED ORDER — MORPHINE SULFATE (PF) 2 MG/ML IV SOLN
0.5000 mg | INTRAVENOUS | Status: DC | PRN
  Administered 2022-03-16 – 2022-03-17 (×2): 1 mg via INTRAVENOUS
  Filled 2022-03-16 (×3): qty 1

## 2022-03-16 MED ORDER — ENOXAPARIN SODIUM 40 MG/0.4ML IJ SOSY
40.0000 mg | PREFILLED_SYRINGE | INTRAMUSCULAR | Status: DC
  Administered 2022-03-16 – 2022-03-19 (×4): 40 mg via SUBCUTANEOUS
  Filled 2022-03-16 (×4): qty 0.4

## 2022-03-16 MED ORDER — ACETAMINOPHEN 325 MG PO TABS: 650.0000 mg | ORAL_TABLET | ORAL | Status: DC | PRN

## 2022-03-16 MED ORDER — LACTATED RINGERS IV SOLN: INTRAVENOUS | Status: DC

## 2022-03-16 NOTE — NC FL2 (Signed)
[place] LEVEL OF CARE FORM     IDENTIFICATION  Patient Name: [name] Morton Birthdate: 1926/04/05 Sex: female Admission Date (Current Location): 03/15/2022  [facility] and [place] Number:  Herbalist and Address:  [facility],  [address] [place], [place]      Provider Number: [id number]  Attending Physician Name and Address:  Mendel Corning, MD  Relative Name and Phone Number:  [name] (daughter) Ph: [phone]    Current Level of Care: Hospital Recommended Level of Care: [place] Prior Approval Number:    Date Approved/Denied:   PASRR Number: [id number]  Discharge Plan: SNF    Current Diagnoses: Patient Active Problem List   Diagnosis Date Noted   Fall 03/15/2022   Anterior displaced fracture of sternal end of left clavicle 03/15/2022   Rib fractures 03/15/2022   Hypokalemia 03/15/2022   AKI (acute kidney injury) ([place]) 03/15/2022   Proximal muscle weakness 10/12/2019   Lymphedema of right arm 10/12/2019   Mild protein-calorie malnutrition ([place]) 04/10/2019   Dementia with behavioral disturbance ([place]) 04/10/2019   Mild dementia ([place]) 08/24/2017   Neuropathy 08/24/2017   Syncope and collapse 10/09/2016   Exertional chest tightness 08/02/2016   Exertional dyspnea 64/33/2951   Aortic systolic murmur on examination 07/31/2016   Fatigue 07/01/2016   Acute blood loss anemia 04/02/2014   Colitis 88/41/6606   Diastolic dysfunction with chronic heart failure ([place]) 04/01/2014   GERD (gastroesophageal reflux disease) 05/02/2013   Diverticulosis of large intestine 05/02/2013   OA (osteoarthritis) of knee 05/01/2013   Arm edema - right 12/29/2012   Bilateral leg edema 12/29/2012   Essential hypertension    Dyslipidemia, goal LDL below 100     Orientation RESPIRATION BLADDER Height & Weight     Self, Time, Situation, Place  O2 (2L/min) Continent, Indwelling catheter Weight: 125 lb 10.6 oz (57  kg) Height:  5' (152.4 cm)  BEHAVIORAL SYMPTOMS/MOOD NEUROLOGICAL BOWEL NUTRITION STATUS     (N/A) Incontinent Diet (Heart healthy diet)  AMBULATORY STATUS COMMUNICATION OF NEEDS Skin   Limited Assist Verbally Skin abrasions, Other (Comment) (Abrasion: left leg; Ecchymosis: chest, left shoulder)                       Personal Care Assistance Level of Assistance  Bathing, Feeding, Dressing Bathing Assistance: Limited assistance Feeding assistance: Independent Dressing Assistance: Limited assistance     Functional Limitations Info  Sight, Hearing, Speech Sight Info: Impaired Hearing Info: Impaired Speech Info: Adequate    SPECIAL CARE FACTORS FREQUENCY  PT (By licensed PT), OT (By licensed OT)     PT Frequency: 5x's/week OT Frequency: 5x's/week            Contractures Contractures Info: Not present    Additional Factors Info  Code Status, Allergies Code Status Info: Full Allergies Info: Benadryl (Diphenhydramine Hcl (Sleep)), Cardizem (Diltiazem Hcl), Ceftin (Cefuroxime Axetil), Epinephrine, Oxycodone Hcl, Tetanus Toxoids, Tramadol Hcl, Vistaril (Hydroxyzine), Vytorin (Ezetimibe-simvastatin)           Current Medications (03/16/2022):  This is the current hospital active medication list Current Facility-Administered Medications  Medication Dose Route Frequency Provider Last Rate Last Admin   acetaminophen (TYLENOL) tablet 1,000 mg  1,000 mg Oral TID Rai, Ripudeep K, MD       acetaminophen (TYLENOL) tablet 650 mg  650 mg Oral Q4H PRN Rai, Ripudeep K, MD       bisoprolol (ZEBETA) tablet 5 mg  5 mg  Oral Daily Gala Romney L, MD   5 mg at 03/15/22 2231   donepezil (ARICEPT) tablet 5 mg  5 mg Oral QHS Gala Romney L, MD   5 mg at 03/15/22 2231   enoxaparin (LOVENOX) injection 40 mg  40 mg Subcutaneous Q24H Eilene Ghazi S, RPH       hydrALAZINE (APRESOLINE) injection 5 mg  5 mg Intravenous Q4H PRN Gala Romney L, MD   5 mg at 03/16/22 0428   lactated  ringers infusion   Intravenous Continuous Rai, Ripudeep K, MD 100 mL/hr at 03/16/22 1502 Infusion Verify at 03/16/22 1502   lidocaine (LIDODERM) 5 % 1 patch  1 patch Transdermal Q24H Rai, Ripudeep K, MD   1 patch at 03/16/22 1336   methocarbamol (ROBAXIN) 500 mg in dextrose 5 % 50 mL IVPB  500 mg Intravenous Q8H Rai, Ripudeep K, MD   Stopped at 03/16/22 1409   morphine (PF) 2 MG/ML injection 0.5-1 mg  0.5-1 mg Intravenous Q3H PRN Rai, Ripudeep K, MD       ondansetron (ZOFRAN) tablet 4 mg  4 mg Oral Q6H PRN Elwyn Reach, MD       Or   ondansetron (ZOFRAN) injection 4 mg  4 mg Intravenous Q6H PRN Elwyn Reach, MD         Discharge Medications: Please see discharge summary for a list of discharge medications.  Relevant Imaging Results:  Relevant Lab Results:   Additional Information SSN: [id number]  Sherie Don, LCSW

## 2022-03-16 NOTE — Progress Notes (Signed)
[facility]                                                                              [name] Morton, is a 89 y.o. female, DOB - 09-25-1926, OZD:[id number] Admit date - 03/15/2022    Outpatient Primary MD for the patient is [name], [name], DO  LOS - 1  days  Chief Complaint  Patient presents with   Fatigue       Brief summary   Patient is a 89 year old female with history of right breast CA, CKD stage III, dementia, HTN, HLP, GERD presented from home with a fall.  Patient was brought in by her daughter for worsening altered mental status.  She has been noted to be declining at home.  While in the ER she was noted to have left chest wall bruising.  Imaging showed left clavicle fracture with multiple left-sided rib fractures.  Patient appeared to be in pain with elevated BP. Left arm sling was placed, daughter also reported patient having poor p.o. intake and not taking much fluids. In ED Temp 97.8, RR 22, pulse 61, BP 154/57, O2 sats 92- 100% on room air CMP showed sodium 140, potassium 3.3, BUN 30, creatinine 1.09.  Lipase 141, AST 64, ALT 56, total bilirubin 1.6. CT chest showed midshaft left clavicular fracture.  Left anterior 3rd-5th ribs and posterior lateral 4th-6th sixth ribs fractures  CT abdomen and pelvis showed prominent gallbladder with evidence of intrahepatic and extrahepatic biliary duct dilatation, overall stable since 2016.  Assessment & Plan    Principal Problem:   Fall, left anterior 3rd-5th, posterolateral 4th-6th Rib fractures -At home, gait instability in the setting of dementia, failure to thrive -Continue pain control, supportive care, PT OT -Incentive spirometry  Active Problems:   Anterior displaced fracture of sternal end of left clavicle -Continue sling, pain control  Pain control -Currently very confused, will decrease morphine to 0.5-1 mg every 3 hours as needed, add scheduled Tylenol, Lidoderm patch, Robaxin 500 mg IV 8  hours -Titrate cautiously to avoid worsening of mental status or respiratory depression    Essential hypertension -Continue bisoprolol, IV hydralazine as needed with parameters -Hold Lasix, benazepril  Mild transaminitis -CT abdomen pelvis showed prominent gallbladder with evidence of intrahepatic and extrahepatic biliary ductal dilation overall stable since 2016 -Improving, total bilirubin back to normal 1.1 today -Mildly elevated lipase on labs, no pancreatitis on imaging    Diastolic dysfunction with chronic heart failure (HCC) -Currently stable, euvolemic - hold Lasix    Dementia with behavioral disturbance (HCC) Continue Aricept     Hypokalemia Potassium 3.3 on admission, resolved    AKI (acute kidney injury) ([place]) -Creatinine 1.09 on admission, was 0.84 on 05/22/2021 -Improved, back to baseline 0.8, continue gentle hydration Hold Lasix, benazepril   Estimated body mass index is 24.54 kg/m as calculated from the following:   Height as of this encounter: 5' (1.524 m).   Weight as of this encounter: 57 kg.  Code Status: Full CODE STATUS DVT Prophylaxis:  enoxaparin (LOVENOX) injection 30 mg Start: 03/15/22 2200   Level of Care: Level of care: Med-Surg Family Communication: Updated patient's  son-in-law at the bedside   Disposition Plan:      Remains inpatient appropriate: Likely will need skilled nursing facility, has dementia, lives alone, has been declining and now with fall with multiple rib fractures, clavicular fracture  Procedures:  None  Consultants:   None  Antimicrobials: None  Anti-infectives (From admission, onward)    None          Medications  bisoprolol  5 mg Oral Daily   donepezil  5 mg Oral QHS   enoxaparin (LOVENOX) injection  30 mg Subcutaneous Q24H      Subjective:   [name] Morton was seen and examined today.  Currently, somnolent but easily arousable, oriented to self.  Son-in-law at the bedside.  Difficult to obtain  ROS from the patient due to dementia and had received pain medication in the morning.  Objective:   Vitals:   03/16/22 0351 03/16/22 0428 03/16/22 0500 03/16/22 0935  BP:  (!) 155/58 (!) 134/44 129/61  Pulse:  (!) 53 (!) 57 61  Resp:  (!) 22  18  Temp:  97.8 F (36.6 C)  98.5 F (36.9 C)  TempSrc:  Oral  Oral  SpO2:  98% 98% 99%  Weight: 57 kg     Height: 5' (1.524 m)       Intake/Output Summary (Last 24 hours) at 03/16/2022 1129 Last data filed at 03/16/2022 2585 Gross per 24 hour  Intake 1012.48 ml  Output --  Net 1012.48 ml     Wt Readings from Last 3 Encounters:  03/16/22 57 kg  05/23/21 62.1 kg  05/06/20 62.2 kg     Exam General: Somewhat somnolent but easily arousable, confused Cardiovascular: S1 S2 auscultated,  RRR Respiratory: Clear to auscultation bilaterally, no wheezing Gastrointestinal: Soft, nontender, nondistended, + bowel sounds Ext: no pedal edema bilaterally Neuro: difficulty to assess with current mental status. Musculoskeletal: Left arm sling Skin: Left upper remedy bruising Psych: confused and dysphoric.    Data Reviewed:  I have personally reviewed following labs    CBC Lab Results  Component Value Date   WBC 10.1 03/16/2022   RBC 4.70 03/16/2022   HGB 13.7 03/16/2022   HCT 41.6 03/16/2022   MCV 88.5 03/16/2022   MCH 29.1 03/16/2022   PLT 240 03/16/2022   MCHC 32.9 03/16/2022   RDW 13.7 03/16/2022   LYMPHSABS 1.5 03/15/2022   MONOABS 0.8 03/15/2022   EOSABS 0.0 03/15/2022   BASOSABS 0.0 27/78/2423     Last metabolic panel Lab Results  Component Value Date   NA 141 03/16/2022   K 3.5 03/16/2022   CL 103 03/16/2022   CO2 29 03/16/2022   BUN 24 (H) 03/16/2022   CREATININE 0.85 03/16/2022   GLUCOSE 157 (H) 03/16/2022   GFRNONAA >60 03/16/2022   GFRAA 50 (L) 05/06/2020   CALCIUM 9.0 03/16/2022   PROT 6.4 (L) 03/16/2022   ALBUMIN 3.5 03/16/2022   BILITOT 1.1 03/16/2022   ALKPHOS 73 03/16/2022   AST 53 (H) 03/16/2022    ALT 49 (H) 03/16/2022   ANIONGAP 9 03/16/2022    CBG (last 3)  No results for input(s): "GLUCAP" in the last 72 hours.    Coagulation Profile: No results for input(s): "INR", "PROTIME" in the last 168 hours.   Radiology Studies: I have personally reviewed the imaging studies  CT CHEST ABDOMEN PELVIS W CONTRAST  Result Date: 03/15/2022 CLINICAL DATA:  Increased lethargy and recent fall, initial encounter EXAM: CT CHEST, ABDOMEN, AND PELVIS WITH CONTRAST TECHNIQUE:  Multidetector CT imaging of the chest, abdomen and pelvis was performed following the standard protocol during bolus administration of intravenous contrast. RADIATION DOSE REDUCTION: This exam was performed according to the departmental dose-optimization program which includes automated exposure control, adjustment of the mA and/or kV according to patient size and/or use of iterative reconstruction technique. CONTRAST:  185m OMNIPAQUE IOHEXOL 300 MG/ML  SOLN COMPARISON:  Chest x-ray from earlier in the same day, CT from 03/31/2014 FINDINGS: CT CHEST FINDINGS Cardiovascular: Atherosclerotic calcifications are noted. No aneurysmal dilatation or dissection is seen. Coronary calcifications are noted. Pulmonary artery is within normal limits with no large central pulmonary embolus. Timing was not performed for embolus evaluation. Heart is at the upper limits of normal in size. Mediastinum/Nodes: Thoracic inlet is within normal limits. The esophagus is unremarkable as visualized. No hilar or mediastinal adenopathy is noted. Minimal pericardial fluid is noted similar to that seen on prior CT from 2016. Lungs/Pleura: Minimal scarring is noted in the bases bilaterally. No focal infiltrate or effusion is seen. No pneumothorax is noted. Musculoskeletal: Midshaft left clavicular fracture is noted similar to that seen on prior plain film examination. Mild degenerative changes of the thoracic spine are noted. No compression deformity is seen. There are  left-sided rib fractures identified involving the anterior third through fifth ribs as well as the posterolateral fourth through sixth ribs. No complicating factors are noted. CT ABDOMEN PELVIS FINDINGS Hepatobiliary: Gallbladder is well distended. The common bile duct and intrahepatic biliary tree are prominent. The distal common bile duct measures 12 mm with some distal tapering identified. Mild fullness of the proximal pancreatic duct is noted as well. These changes are stable from the prior CT of 2016 and have been evaluated on prior MRI without evidence of common bile duct stone identified. No new stone is identified. Pancreas: Lipoma is noted in the pancreatic head stable from the prior exam. The peripheral pancreatic duct is within normal limits. Spleen: Normal in size without focal abnormality. Adrenals/Urinary Tract: Adrenal glands are within normal limits. Kidneys demonstrate a normal enhancement pattern bilaterally. No renal calculi or obstructive changes are noted. There is a mildly hyperdense cyst identified within the left kidney measuring 2.5 cm in greatest dimension. It shows no significant enhancement on delayed images and is increased in size from a subcentimeter cyst in 2016. No obstructive changes are seen. The bladder is well distended. Stomach/Bowel: Scattered diverticular changes noted within the colon. No findings to suggest diverticulitis are seen. The appendix has been surgically. Small bowel stomach are within normal limits. Vascular/Lymphatic: Aortic atherosclerosis. No enlarged abdominal or pelvic lymph nodes. Reproductive: Status post hysterectomy. No adnexal masses. Other: No abdominal wall hernia or abnormality. No abdominopelvic ascites. Musculoskeletal: No acute or significant osseous findings. Chronic L1 compression deformity is noted. Changes consistent with prior fracture of the left L3 transverse process with healing. IMPRESSION: CT of the chest: Multiple left-sided rib  fractures as described. No pneumothorax is noted. Midshaft left clavicular fracture without complicating factors. No evidence of pulmonary embolus. CT of the abdomen and pelvis: Prominent gallbladder with evidence of intrahepatic and extrahepatic biliary ductal dilatation. The overall appearance is stable from 2016. This has been previously evaluated without evidence of distal common bile duct stone. This is likely the steady-state for the patient's given age of 89 Diverticulosis without diverticulitis. Hyperdense cyst within the left kidney measuring 2.5 cm. Nonemergent renal ultrasound could be performed to assess the nature of this lesion Electronically Signed   By: MLinus MakoD.  On: 03/15/2022 19:20   CT Head Wo Contrast  Result Date: 03/15/2022 CLINICAL DATA:  Fall several days ago.  Altered mental status. EXAM: CT HEAD WITHOUT CONTRAST TECHNIQUE: Contiguous axial images were obtained from the base of the skull through the vertex without intravenous contrast. RADIATION DOSE REDUCTION: This exam was performed according to the departmental dose-optimization program which includes automated exposure control, adjustment of the mA and/or kV according to patient size and/or use of iterative reconstruction technique. COMPARISON:  05/22/2021 FINDINGS: Brain: No evidence of intracranial hemorrhage, acute infarction, hydrocephalus, extra-axial collection, or mass lesion/mass effect. Mild diffuse cerebral atrophy and moderate to severe chronic small vessel disease show no significant change in appearance. Vascular:  No hyperdense vessel or other acute findings. Skull: No evidence of fracture or other significant bone abnormality. Sinuses/Orbits:  No acute findings. Other: None. IMPRESSION: No acute intracranial abnormality. Stable cerebral atrophy and chronic small vessel disease. Electronically Signed   By: Marlaine Hind M.D.   On: 03/15/2022 16:28   DG Chest 2 View  Result Date: 03/15/2022 CLINICAL DATA:   Chest wall bruising, dementia, left shoulder pain EXAM: CHEST - 2 VIEW COMPARISON:  05/22/2021 chest radiograph. FINDINGS: Surgical clips overlie the right axilla and right upper abdomen. Stable cardiomediastinal silhouette with mild cardiomegaly. No pneumothorax. Chronic moderate elevation of the right hemidiaphragm. New small left pleural effusion. No right pleural effusion. No pulmonary edema. Chronic right lung base atelectasis. New mild compressive lateral left lung base atelectasis. Left mid clavicle shaft fracture with displacement and overriding. Lateral left fourth and fifth rib fractures. These fractures are new. IMPRESSION: 1. Left mid clavicle shaft fracture with displacement and overriding. Lateral left fourth and fifth rib fractures. These fractures are new from 05/22/2021 chest radiograph and potentially acute. 2. No pneumothorax. 3. New small left pleural effusion. 4. Chronic moderate elevation of the right hemidiaphragm with chronic right lung base atelectasis. Electronically Signed   By: Ilona Sorrel M.D.   On: 03/15/2022 15:53   DG Shoulder Left  Result Date: 03/15/2022 CLINICAL DATA:  Dementia, chest wall bruising, shoulder pain EXAM: LEFT SHOULDER - 2+ VIEW COMPARISON:  05/22/2021 chest radiograph. FINDINGS: Left mid clavicular shaft fracture with prominent 4 cm overriding of the fracture fragments and 1.5 cm inferior displacement of the lateral fracture fragment, new. New lateral left fourth, fifth and sixth rib fractures with displaced left fifth rib fracture. No glenohumeral dislocation. No evidence of acromioclavicular separation. No suspicious focal osseous lesions. IMPRESSION: 1. Displaced and overriding left mid clavicular shaft fracture, new since 05/22/2021 chest radiograph and potentially acute. 2. Lateral left fourth, fifth and sixth rib fractures with displaced left fifth rib fracture, new since 05/22/2021 chest radiograph and potentially acute. Electronically Signed   By: Ilona Sorrel M.D.   On: 03/15/2022 15:50       Ashrita Chrismer M.D. [facility] 03/16/2022, 11:29 AM  Available via Epic secure chat 7am-7pm After 7 pm, please refer to night coverage provider listed on amion.

## 2022-03-16 NOTE — Plan of Care (Signed)
  Problem: Education: Goal: Knowledge of General Education information will improve Description: Including pain rating scale, medication(s)/side effects and non-pharmacologic comfort measures Outcome: Progressing   Problem: Coping: Goal: Level of anxiety will decrease Outcome: Progressing   Problem: Pain Managment: Goal: General experience of comfort will improve Outcome: Progressing   Problem: Elimination: Goal: Will not experience complications related to bowel motility Outcome: Progressing Goal: Will not experience complications related to urinary retention Outcome: Progressing   Problem: Safety: Goal: Ability to remain free from injury will improve Outcome: Progressing   Problem: Skin Integrity: Goal: Risk for impaired skin integrity will decrease Outcome: Progressing

## 2022-03-16 NOTE — TOC Initial Note (Signed)
Transition of Care [facility]) - Initial/Assessment Note   Patient Details  Name: [name] Morton MRN: [id number] Date of Birth: 10-14-1926  Transition of [facility]) CM/SW Contact:    Sherie Don, LCSW Phone Number: 03/16/2022, 2:46 PM  Clinical Narrative: PT evaluation recommended SNF. CSW met with patient's daughter, [name], to discuss recommendations. Daughter is agreeable to rehab and requested [facility] as her first choice if possible. FL2 done; PASRR received. Initial referral faxed out. TOC awaiting bed offers.   Expected Discharge Plan: [place] Barriers to Discharge: Continued Medical Work up, SNF Pending bed offer  Patient Goals and CMS Choice Patient states their goals for this hospitalization and ongoing recovery are:: Go to [facility] for SNF if a bed is available CMS Medicare.gov Compare Post Acute Care list provided to:: Patient Choice offered to / list presented to : Patient, Adult Children  Expected Discharge Plan and Services In-house Referral: Clinical Social Work Post Acute Care Choice: [place] Living arrangements for the past 2 months: [place]           DME Arranged: N/A DME Agency: NA  Prior Living Arrangements/Services Living arrangements for the past 2 months: Single Family Home Patient language and need for interpreter reviewed:: Yes Do you feel safe going back to the place where you live?: Yes      Need for Family Participation in Patient Care: Yes (Comment) Care giver support system in place?: Yes (comment) Criminal Activity/Legal Involvement Pertinent to Current Situation/Hospitalization: No - Comment as needed  Activities of Daily Living Home Assistive Devices/Equipment: [place] Rile (specify type), Grab bars around toilet, Grab bars in shower (rollator) ADL Screening (condition at time of admission) Patient's cognitive ability adequate to safely complete daily activities?: Yes Is the patient deaf or  have difficulty hearing?: Yes Does the patient have difficulty seeing, even when wearing glasses/contacts?: No Does the patient have difficulty concentrating, remembering, or making decisions?: Yes Patient able to express need for assistance with ADLs?: Yes Does the patient have difficulty dressing or bathing?: No Independently performs ADLs?: Yes (appropriate for developmental age) Does the patient have difficulty walking or climbing stairs?: Yes Weakness of Legs: None Weakness of Arms/Hands: None  Permission Sought/Granted Permission sought to share information with : Facility Art therapist granted to share information with : Yes, Verbal Permission Granted Permission granted to share info w AGENCY: SNFs  Emotional Assessment Appearance:: Appears stated age Attitude/Demeanor/Rapport: Lethargic Orientation: : Oriented to Self, Oriented to Place, Oriented to  Time, Oriented to Situation Alcohol / Substance Use: Not Applicable Psych Involvement: No (comment)  Admission diagnosis:  Weakness [R53.1] Disorientation [R41.0] Fall [W19.XXXA] Closed fracture of multiple ribs of left side, initial encounter [S22.42XA] Closed displaced fracture of left clavicle, unspecified part of clavicle, initial encounter [S42.002A] Patient Active Problem List   Diagnosis Date Noted   Fall 03/15/2022   Anterior displaced fracture of sternal end of left clavicle 03/15/2022   Rib fractures 03/15/2022   Hypokalemia 03/15/2022   AKI (acute kidney injury) ([place]) 03/15/2022   Proximal muscle weakness 10/12/2019   Lymphedema of right arm 10/12/2019   Mild protein-calorie malnutrition ([place]) 04/10/2019   Dementia with behavioral disturbance ([place]) 04/10/2019   Mild dementia ([place]) 08/24/2017   Neuropathy 08/24/2017   Syncope and collapse 10/09/2016   Exertional chest tightness 08/02/2016   Exertional dyspnea 11/91/4782   Aortic systolic murmur on examination 07/31/2016   Fatigue  07/01/2016   Acute blood loss anemia 04/02/2014   Colitis 04/01/2014  Diastolic dysfunction with chronic heart failure ([place]) 04/01/2014   GERD (gastroesophageal reflux disease) 05/02/2013   Diverticulosis of large intestine 05/02/2013   OA (osteoarthritis) of knee 05/01/2013   Arm edema - right 12/29/2012   Bilateral leg edema 12/29/2012    Class: History of   Essential hypertension    Dyslipidemia, goal LDL below 100    PCP:  Gayland Curry, DO Pharmacy:   [facility] [place] - [address] [place] [address] Phone: [phone] Fax: [phone]  [facility] - [place], [place] - [address] [place] [address] Phone: [phone] Fax: [place] [address] Phone: [phone] Fax: [phone]  Social Determinants of Health (SDOH) Social History: SDOH Screenings   Food Insecurity: No Food Insecurity (03/16/2022)  Housing: Low Risk  (03/16/2022)  Transportation Needs: No Transportation Needs (03/16/2022)  Utilities: Not At Risk (03/16/2022)  Depression (PHQ2-9): Low Risk  (04/10/2019)  Tobacco Use: Medium Risk (03/15/2022)   SDOH Interventions:    Readmission Risk Interventions     No data to display

## 2022-03-16 NOTE — Evaluation (Signed)
Physical Therapy Evaluation Patient Details Name: [name] Morton MRN: [id number] DOB: October 12, 1926 Today's Date: 03/16/2022  History of Present Illness  89 y.o. female  adm with worsening altered mental status, FTT. chest bruising noted and pt found to have left clavicular fracture with multiple left-sided rib fractures.   PMH: right breast cancer, chronic kidney disease stage III, dementia, hypertension, hyperlipidemia, GERD, L TKA, R RCR  Clinical Impression  Pt admitted with above diagnosis.  Pt pleasantly confused, son-in-law present. Pt able to stand and pivot to chair with min assist, possibly dizzy (?) however BP stable and pt unable to express how she was feeling.  Pt not at her baseline mental status or mobility per family. Will need SNF. Continue PT in acute setting; BP 99% on 2L, 89-91% on RA, O2 replaced at 2L.   Pt currently with functional limitations due to the deficits listed below (see PT Problem List). Pt will benefit from skilled PT to increase their independence and safety with mobility to allow discharge to the venue listed below.          Recommendations for follow up therapy are one component of a multi-disciplinary discharge planning process, led by the attending physician.  Recommendations may be updated based on patient status, additional functional criteria and insurance authorization.  Follow Up Recommendations Skilled nursing-short term rehab (<3 hours/day)      Assistance Recommended at Discharge Frequent or constant Supervision/Assistance  Patient can return home with the following  A little help with walking and/or transfers;Assistance with cooking/housework;Direct supervision/assist for medications management;Assist for transportation;Help with stairs or ramp for entrance;Direct supervision/assist for financial management;A little help with bathing/dressing/bathroom    Equipment Recommendations Other (comment) (defer to SNF)  Recommendations for Other  Services       Functional Status Assessment Patient has had a recent decline in their functional status and demonstrates the ability to make significant improvements in function in a reasonable and predictable amount of time.     Precautions / Restrictions Precautions Precautions: Fall Required Braces or Orthoses: Sling Restrictions Other Position/Activity Restrictions: no orders for L UE, assumed NWB, sling in place      Mobility  Bed Mobility Overal bed mobility: Needs Assistance Bed Mobility: Supine to Sit     Supine to sit: Mod assist, HOB elevated     General bed mobility comments: assist to progress LEs off bed and elevate trunk    Transfers Overall transfer level: Needs assistance Equipment used: 1 person hand held assist Transfers: Bed to chair/wheelchair/BSC             General transfer comment: STS x2. assist to rise and steady. pt sat almost immediately after 1st trial, said yes to being dizzy; BP 153/73. min assist to balance with stand step pivot to chair    Ambulation/Gait               General Gait Details: NT/fatigued  Stairs            Wheelchair Mobility    Modified Rankin (Stroke Patients Only)       Balance Overall balance assessment: Needs assistance, History of Falls Sitting-balance support: No upper extremity supported, Feet supported Sitting balance-Leahy Scale: Fair     Standing balance support: Single extremity supported, During functional activity Standing balance-Leahy Scale: Poor                               Pertinent Vitals/Pain  Home Living Family/patient expects to be discharged to:: Skilled nursing facility Living Arrangements: Alone               Home Equipment: Conservation officer, nature (2 wheels)      Prior Function               Mobility Comments: family checks in frequently, pt uses RW when going out wtih family       Hand Dominance        Extremity/Trunk Assessment    Upper Extremity Assessment Upper Extremity Assessment: Defer to OT evaluation    Lower Extremity Assessment Lower Extremity Assessment: Generalized weakness       Communication   Communication: No difficulties  Cognition Arousal/Alertness: Awake/alert Behavior During Therapy: WFL for tasks assessed/performed Overall Cognitive Status: Impaired/Different from baseline Area of Impairment: Orientation, Attention, Following commands, Safety/judgement, Problem solving                 Orientation Level: Disoriented to, Place, Time, Situation Current Attention Level: Focused   Following Commands: Follows one step commands with increased time, Follows multi-step commands inconsistently Safety/Judgement: Decreased awareness of safety, Decreased awareness of deficits   Problem Solving: Slow processing, Decreased initiation, Difficulty sequencing, Requires verbal cues, Requires tactile cues General Comments: at baseline mostly memory deficits per pt son-in-law. pt recognizes family, able to converse        General Comments      Exercises     Assessment/Plan    PT Assessment Patient needs continued PT services  PT Problem List Decreased strength;Decreased balance;Decreased activity tolerance;Decreased safety awareness;Decreased mobility;Decreased cognition;Decreased knowledge of precautions       PT Treatment Interventions DME instruction;Gait training;Functional mobility training;Therapeutic activities;Therapeutic exercise;Balance training;Patient/family education    PT Goals (Current goals can be found in the Care Plan section)  Acute Rehab PT Goals PT Goal Formulation: Patient unable to participate in goal setting Time For Goal Achievement: 03/30/22 Potential to Achieve Goals: Good    Frequency Min 3X/week     Co-evaluation               AM-PAC PT "6 Clicks" Mobility  Outcome Measure Help needed turning from your back to your side while in a flat bed without  using bedrails?: A Lot Help needed moving from lying on your back to sitting on the side of a flat bed without using bedrails?: A Lot Help needed moving to and from a bed to a chair (including a wheelchair)?: A Lot Help needed standing up from a chair using your arms (e.g., wheelchair or bedside chair)?: A Lot Help needed to walk in hospital room?: A Lot Help needed climbing 3-5 steps with a railing? : Total 6 Click Score: 11    End of Session Equipment Utilized During Treatment: Gait belt Activity Tolerance: Patient tolerated treatment well Patient left: in chair;with call bell/phone within reach;with chair alarm set Nurse Communication: Mobility status      Time: 4696-2952 PT Time Calculation (min) (ACUTE ONLY): 19 min   Charges:   PT Evaluation $PT Eval Low Complexity: Adeline, PT  Acute Rehab Dept [facility]) [phone]  [facility] Weekend Pager [facility] only)  [phone]  03/16/2022   [facility] 03/16/2022, 11:38 AM

## 2022-03-16 NOTE — Progress Notes (Signed)
OT Cancellation Note  Patient Details Name: [name] Morton MRN: [id number] DOB: 1926-09-01   Cancelled Treatment:    Reason Eval/Treat Not Completed: Other (comment) Patient asleep in recliner with family in room reporting this is the most peaceful patient has been since admission. Family requesting OT come check back on 1/16. OT to continue to follow and check back on 1/16 per family request.  Rennie Plowman, [place] Acute Rehabilitation Department Office# [phone]  03/16/2022, 2:32 PM

## 2022-03-17 DIAGNOSIS — I5032 Chronic diastolic (congestive) heart failure: Secondary | ICD-10-CM

## 2022-03-17 DIAGNOSIS — R531 Weakness: Secondary | ICD-10-CM | POA: Diagnosis not present

## 2022-03-17 DIAGNOSIS — F03918 Unspecified dementia, unspecified severity, with other behavioral disturbance: Secondary | ICD-10-CM

## 2022-03-17 DIAGNOSIS — E785 Hyperlipidemia, unspecified: Secondary | ICD-10-CM

## 2022-03-17 DIAGNOSIS — S2242XA Multiple fractures of ribs, left side, initial encounter for closed fracture: Secondary | ICD-10-CM | POA: Diagnosis not present

## 2022-03-17 DIAGNOSIS — R410 Disorientation, unspecified: Secondary | ICD-10-CM | POA: Diagnosis not present

## 2022-03-17 DIAGNOSIS — S42002A Fracture of unspecified part of left clavicle, initial encounter for closed fracture: Secondary | ICD-10-CM | POA: Diagnosis not present

## 2022-03-17 LAB — CBC
HCT: 49.1 % — ABNORMAL HIGH (ref 36.0–46.0)
Hemoglobin: 16.4 g/dL — ABNORMAL HIGH (ref 12.0–15.0)
MCH: 29.5 pg (ref 26.0–34.0)
MCHC: 33.4 g/dL (ref 30.0–36.0)
MCV: 88.3 fL (ref 80.0–100.0)
Platelets: 189 10*3/uL (ref 150–400)
RBC: 5.56 MIL/uL — ABNORMAL HIGH (ref 3.87–5.11)
RDW: 14.1 % (ref 11.5–15.5)
WBC: 8 10*3/uL (ref 4.0–10.5)
nRBC: 0 % (ref 0.0–0.2)

## 2022-03-17 LAB — COMPREHENSIVE METABOLIC PANEL
ALT: 86 U/L — ABNORMAL HIGH (ref 0–44)
AST: 105 U/L — ABNORMAL HIGH (ref 15–41)
Albumin: 3.5 g/dL (ref 3.5–5.0)
Alkaline Phosphatase: 121 U/L (ref 38–126)
Anion gap: 9 (ref 5–15)
BUN: 19 mg/dL (ref 8–23)
CO2: 27 mmol/L (ref 22–32)
Calcium: 9 mg/dL (ref 8.9–10.3)
Chloride: 102 mmol/L (ref 98–111)
Creatinine, Ser: 0.78 mg/dL (ref 0.44–1.00)
GFR, Estimated: >60 mL/min (ref >60)
Glucose, Bld: 115 mg/dL — ABNORMAL HIGH (ref 70–99)
Potassium: 3.4 mmol/L — ABNORMAL LOW (ref 3.5–5.1)
Sodium: 138 mmol/L (ref 135–145)
Total Bilirubin: 1.6 mg/dL — ABNORMAL HIGH (ref 0.3–1.2)
Total Protein: 6.6 g/dL (ref 6.5–8.1)

## 2022-03-17 LAB — LIPASE, BLOOD: Lipase: 64 U/L — ABNORMAL HIGH (ref 11–51)

## 2022-03-17 LAB — HEPATITIS PANEL, ACUTE
HCV Ab: NONREACTIVE
Hep A IgM: NONREACTIVE
Hep B C IgM: NONREACTIVE
Hepatitis B Surface Ag: NONREACTIVE

## 2022-03-17 LAB — AMYLASE: Amylase: 94 U/L (ref 28–100)

## 2022-03-17 LAB — URINE CULTURE: Culture: NO GROWTH

## 2022-03-17 LAB — CK: Total CK: 209 U/L (ref 38–234)

## 2022-03-17 MED ORDER — METHOCARBAMOL 500 MG PO TABS
500.0000 mg | ORAL_TABLET | Freq: Three times a day (TID) | ORAL | Status: DC
  Administered 2022-03-17 – 2022-03-20 (×9): 500 mg via ORAL
  Filled 2022-03-17 (×10): qty 1

## 2022-03-17 MED ORDER — AMLODIPINE BESYLATE 10 MG PO TABS
10.0000 mg | ORAL_TABLET | Freq: Every day | ORAL | Status: DC
  Administered 2022-03-17 – 2022-03-20 (×4): 10 mg via ORAL
  Filled 2022-03-17 (×4): qty 1

## 2022-03-17 MED ORDER — POLYETHYLENE GLYCOL 3350 17 G PO PACK: 17.0000 g | PACK | Freq: Every day | ORAL | Status: DC | PRN

## 2022-03-17 MED ORDER — AMLODIPINE BESY-BENAZEPRIL HCL 10-20 MG PO CAPS: 1.0000 | ORAL_CAPSULE | Freq: Every day | ORAL | Status: DC

## 2022-03-17 MED ORDER — BENAZEPRIL HCL 20 MG PO TABS
20.0000 mg | ORAL_TABLET | Freq: Every day | ORAL | Status: DC
  Administered 2022-03-17 – 2022-03-20 (×4): 20 mg via ORAL
  Filled 2022-03-17 (×4): qty 1

## 2022-03-17 MED ORDER — POTASSIUM CHLORIDE CRYS ER 20 MEQ PO TBCR
40.0000 meq | EXTENDED_RELEASE_TABLET | Freq: Once | ORAL | Status: AC
  Administered 2022-03-17: 40 meq via ORAL
  Filled 2022-03-17: qty 2

## 2022-03-17 MED ORDER — SENNOSIDES-DOCUSATE SODIUM 8.6-50 MG PO TABS
2.0000 | ORAL_TABLET | Freq: Every day | ORAL | Status: DC
  Administered 2022-03-17 – 2022-03-18 (×2): 2 via ORAL
  Filled 2022-03-17 (×3): qty 2

## 2022-03-17 MED ORDER — FUROSEMIDE 20 MG PO TABS
20.0000 mg | ORAL_TABLET | Freq: Every day | ORAL | Status: DC
  Administered 2022-03-17 – 2022-03-18 (×2): 20 mg via ORAL
  Filled 2022-03-17 (×2): qty 1

## 2022-03-17 MED ORDER — HYDRALAZINE HCL 20 MG/ML IJ SOLN
10.0000 mg | Freq: Four times a day (QID) | INTRAMUSCULAR | Status: DC | PRN
  Administered 2022-03-19 – 2022-03-20 (×3): 10 mg via INTRAVENOUS
  Filled 2022-03-17 (×4): qty 1

## 2022-03-17 NOTE — Plan of Care (Signed)
  Problem: Coping: °Goal: Level of anxiety will decrease °Outcome: Progressing °  °

## 2022-03-17 NOTE — Evaluation (Signed)
Occupational Therapy Evaluation Patient Details Name: [name] Morton MRN: [id number] DOB: [birth year] Today's Date: 03/17/2022   History of Present Illness Patient is a 89 year old female who presented with failure to thrive. In emergency room patient was found to have bruising on chest. Imaging revealed displaced and overriding left mid clavicular fracture and lateral L 4-6th rib fractures.PMH: chronic L1 compression fx, L3 transverse process fx, breast cancer, dementia, OA   Clinical Impression   Patient is a 89 year old female who was admitted for above. Patient was living at home alone prior level. Patient was noted to be min A for sit to stand with encouragement needed to stand and participate in Adls with patient noted to be quick to fatigue. Patient with sling on LUE with NWB status is not at a level to transition back home alone at this time. Patient was noted to have decreased functional activity tolerance, decreased endurance, decreased standing balance, decreased safety awareness, and decreased knowledge of AD/AE impacting participation in ADLs.  Patient would continue to benefit from skilled OT services at this time while admitted and after d/c to address noted deficits in order to improve overall safety and independence in ADLs.        Recommendations for follow up therapy are one component of a multi-disciplinary discharge planning process, led by the attending physician.  Recommendations may be updated based on patient status, additional functional criteria and insurance authorization.   Follow Up Recommendations  Skilled nursing-short term rehab (<3 hours/day)     Assistance Recommended at Discharge Frequent or constant Supervision/Assistance  Patient can return home with the following A lot of help with bathing/dressing/bathroom;A lot of help with walking and/or transfers;Help with stairs or ramp for entrance;Direct supervision/assist for financial management;Direct  supervision/assist for medications management;Assist for transportation;Assistance with cooking/housework    Functional Status Assessment  Patient has had a recent decline in their functional status and demonstrates the ability to make significant improvements in function in a reasonable and predictable amount of time.  Equipment Recommendations  None recommended by OT       Precautions / Restrictions Precautions Precautions: Fall Required Braces or Orthoses: Sling Restrictions Weight Bearing Restrictions: Yes RUE Weight Bearing: Non weight bearing Other Position/Activity Restrictions: no orders for LUE with noted clavicle fx, assumed NWB, sling in place      Mobility Bed Mobility     General bed mobility comments: patient was up in recliner and start and returned to the same.           ADL either performed or assessed with clinical judgement   ADL Overall ADL's : Needs assistance/impaired Eating/Feeding: Sitting;Minimal assistance Eating/Feeding Details (indicate cue type and reason): patients daughter reported having a choking episode this AM. patient has SLP order in place at this time. Grooming: Oral care;Min guard;Standing Grooming Details (indicate cue type and reason): at sink with encouragement to participate in task in standing. Upper Body Bathing: Sitting;Total assistance   Lower Body Bathing: Sitting/lateral leans;Total assistance Lower Body Bathing Details (indicate cue type and reason): uanble to complete figure four positioning. Upper Body Dressing : Total assistance;Sitting   Lower Body Dressing: Total assistance;Sitting/lateral leans     Toilet Transfer Details (indicate cue type and reason): patient was min A to stand at sink for oral care. patient just up to chair prior to session with nursing and already requesting to get back in bed. patient was educated on importance of sitting up for at least a half hour during the  day to increase time OOB. patients  daughter present in room and onboard with encouragement to sit up in chair. Toileting- Clothing Manipulation and Hygiene: Maximal assistance;Sit to/from stand                Pertinent Vitals/Pain Pain Assessment Pain Assessment: Faces Faces Pain Scale: No hurt     Hand Dominance Right   Extremity/Trunk Assessment Upper Extremity Assessment Upper Extremity Assessment: LUE deficits/detail;RUE deficits/detail RUE Deficits / Details: WFL LUE Deficits / Details: in sling with nored clavicle fx in chart no formal orders. patient able to wiggle digits.           Communication Communication Communication: No difficulties   Cognition Arousal/Alertness: Awake/alert Behavior During Therapy: WFL for tasks assessed/performed Overall Cognitive Status: Impaired/Different from baseline Area of Impairment: Orientation, Attention, Following commands, Safety/judgement, Problem solving     Orientation Level: Disoriented to, Place, Time, Situation Current Attention Level: Focused   Following Commands: Follows one step commands with increased time, Follows multi-step commands inconsistently Safety/Judgement: Decreased awareness of safety, Decreased awareness of deficits   Problem Solving: Slow processing, Decreased initiation, Difficulty sequencing, Requires verbal cues, Requires tactile cues General Comments: patient is able to recognize daughter in room. noted to be [facility] with hearing aid in place. patient needed encouragement to participate in tasks.                Home Living Family/patient expects to be discharged to:: Skilled nursing facility Living Arrangements: Alone             Home Equipment: Rolling Morton (2 wheels)          Prior Functioning/Environment Prior Level of Function : Independent/Modified Independent             Mobility Comments: family checks in frequently, pt uses RW when going out wtih family          OT Problem List: Decreased  strength;Decreased activity tolerance;Impaired balance (sitting and/or standing);Decreased coordination;Decreased safety awareness;Decreased knowledge of precautions;Decreased knowledge of use of DME or AE;Impaired UE functional use      OT Treatment/Interventions: Self-care/ADL training;Energy conservation;Therapeutic exercise;DME and/or AE instruction;Therapeutic activities;Patient/family education;Balance training    OT Goals(Current goals can be found in the care plan section) Acute Rehab OT Goals Patient Stated Goal: to get back home OT Goal Formulation: With patient/family Time For Goal Achievement: 03/31/22 Potential to Achieve Goals: Fair  OT Frequency: Min 2X/week       AM-PAC OT "6 Clicks" Daily Activity     Outcome Measure Help from another person eating meals?: A Little Help from another person taking care of personal grooming?: A Little Help from another person toileting, which includes using toliet, bedpan, or urinal?: A Lot Help from another person bathing (including washing, rinsing, drying)?: A Lot Help from another person to put on and taking off regular upper body clothing?: A Lot Help from another person to put on and taking off regular lower body clothing?: A Lot 6 Click Score: 14   End of Session Equipment Utilized During Treatment: Gait belt;Other (comment) (sling) Nurse Communication: Other (comment) (ok to see patient)  Activity Tolerance: Patient tolerated treatment well Patient left: in chair;with call bell/phone within reach;with family/visitor present  OT Visit Diagnosis: Unsteadiness on feet (R26.81);Other abnormalities of gait and mobility (R26.89);Muscle weakness (generalized) (M62.81);Pain Pain - Right/Left: Left Pain - part of body: Shoulder                Time: 1040-1103 OT Time Calculation (min): 23  min Charges:  OT General Charges $OT Visit: 1 Visit OT Evaluation $OT Eval Moderate Complexity: 1 Mod OT Treatments $Self Care/Home Management :  8-22 mins  Rennie Plowman, MS Acute Rehabilitation Department Office# [phone]   Willa Rough 03/17/2022, 12:23 PM

## 2022-03-17 NOTE — Progress Notes (Signed)
Physical Therapy Treatment Patient Details Name: [name] Morton MRN: [id number] DOB: 02-Apr-1926 Today's Date: 03/17/2022   History of Present Illness Patient is a 89 year old female who presented with failure to thrive. In emergency room patient was found to have bruising on chest. Imaging revealed displaced and overriding left mid clavicular fracture and lateral L 4-6th rib fractures.PMH: chronic L1 compression fx, L3 transverse process fx, breast cancer, dementia, OA    PT Comments    Pt cooperative, agreeable to mobility. Amb short hallway distance fatiguing easily. SpO2=89-90% on RA. Will benefit from SNF    Recommendations for follow up therapy are one component of a multi-disciplinary discharge planning process, led by the attending physician.  Recommendations may be updated based on patient status, additional functional criteria and insurance authorization.  Follow Up Recommendations  Skilled nursing-short term rehab (<3 hours/day) Can patient physically be transported by private vehicle: No   Assistance Recommended at Discharge Frequent or constant Supervision/Assistance  Patient can return home with the following A little help with walking and/or transfers;Assistance with cooking/housework;Direct supervision/assist for medications management;Assist for transportation;Help with stairs or ramp for entrance;Direct supervision/assist for financial management;A little help with bathing/dressing/bathroom   Equipment Recommendations  Other (comment) (defer to SNF)    Recommendations for Other Services       Precautions / Restrictions Precautions Precautions: Fall Required Braces or Orthoses: Sling Restrictions RUE Weight Bearing: Non weight bearing Other Position/Activity Restrictions: no orders for LUE with noted clavicle fx, assumed NWB, sling in place     Mobility  Bed Mobility   Bed Mobility: Supine to Sit     Supine to sit: Min assist, Mod assist     General bed  mobility comments: multi-modal cues to initiate sitting, assist to elevate trunk    Transfers Overall transfer level: Needs assistance Equipment used: 1 person hand held assist Transfers: Sit to/from Stand, Bed to chair/wheelchair/BSC Sit to Stand: Min assist   Step pivot transfers: Min assist       General transfer comment: assist to rise and steady, posterior bias    Ambulation/Gait Ambulation/Gait assistance: Min assist Gait Distance (Feet): 30 Feet Assistive device: 1 person hand held assist Gait Pattern/deviations: Step-through pattern, Decreased stride length, Drifts right/left       General Gait Details: unsteady gait with assist needed throughout to steady. fatigues easily with SpO2=89-90% on return to room   Stairs             Wheelchair Mobility    Modified Rankin (Stroke Patients Only)       Balance   Sitting-balance support: No upper extremity supported, Feet supported Sitting balance-Leahy Scale: Fair     Standing balance support: Single extremity supported, During functional activity Standing balance-Leahy Scale: Poor                              Cognition Arousal/Alertness: Awake/alert Behavior During Therapy: WFL for tasks assessed/performed Overall Cognitive Status: History of cognitive impairments - at baseline Area of Impairment: Memory, Problem solving, Awareness, Orientation                 Orientation Level: Disoriented to, Place, Time, Situation Current Attention Level: Focused Memory: Decreased recall of precautions, Decreased short-term memory Following Commands: Follows one step commands with increased time, Follows multi-step commands inconsistently Safety/Judgement: Decreased awareness of safety, Decreased awareness of deficits     General Comments: pt is HOH, has hearing aide on R;  able to ste her name to dietary with encouragement and incr time; recognizes her brother in room        Exercises       General Comments        Pertinent Vitals/Pain Pain Assessment Pain Assessment: Faces Faces Pain Scale: No hurt Pain Intervention(s): Monitored during session    Home Living                          Prior Function            PT Goals (current goals can now be found in the care plan section) Acute Rehab PT Goals PT Goal Formulation: Patient unable to participate in goal setting Time For Goal Achievement: 03/30/22 Potential to Achieve Goals: Good Progress towards PT goals: Progressing toward goals    Frequency    Min 3X/week      PT Plan Current plan remains appropriate    Co-evaluation              AM-PAC PT "6 Clicks" Mobility   Outcome Measure  Help needed turning from your back to your side while in a flat bed without using bedrails?: A Little Help needed moving from lying on your back to sitting on the side of a flat bed without using bedrails?: A Little Help needed moving to and from a bed to a chair (including a wheelchair)?: A Little Help needed standing up from a chair using your arms (e.g., wheelchair or bedside chair)?: A Little Help needed to walk in hospital room?: A Little Help needed climbing 3-5 steps with a railing? : A Lot 6 Click Score: 17    End of Session Equipment Utilized During Treatment: Gait belt Activity Tolerance: Patient tolerated treatment well Patient left: with call bell/phone within reach;in bed;with bed alarm set;with family/visitor present Nurse Communication: Mobility status PT Visit Diagnosis: Unsteadiness on feet (R26.81);Difficulty in walking, not elsewhere classified (R26.2);History of falling (Z91.81)     Time: 9093-1121 PT Time Calculation (min) (ACUTE ONLY): 18 min  Charges:  $Gait Training: 8-22 mins                     [name], PT  Acute Rehab Dept [facility]) [phone]  [facility] Weekend Pager [facility] only)  [phone]  03/17/2022    [facility] 03/17/2022, 5:10 PM

## 2022-03-17 NOTE — Progress Notes (Signed)
Chaplain engaged in an initial visit with [name] Morton, her daughter, and her son-in-law.  Chaplain was able to build rapport with [name] Morton's family and offer a compassionate presence to [name] Morton at the bedside.  [name] Morton was appreciative of Chaplain presence and Chaplain's prayer over her.  Chaplain was able to provide a calming and compassionate presence throughout visit.   [name] Morton stated that she was doing well today and though she doesn't know what is going on, she is taking it one day at a time.  Chaplain uplifted the support she has as she navigates what feels unknown. She has a great support system in her daughter and son-in-law as they are ensuring she gets the care she needs in and outside of the hospital.   Bea Graff, [facility]    03/17/22 0941  Spiritual Encounters  Type of Visit Initial  Care provided to: Pt and family  Reason for visit Routine spiritual support  Spiritual Framework  Community/Connection Family;Faith community  Patient Stress Factors Health changes;Loss of control  Interventions  Spiritual Care Interventions Made Prayer;Compassionate presence;Established relationship of care and support  Intervention Outcomes  Outcomes Connection to spiritual care;Awareness of support

## 2022-03-17 NOTE — TOC Progression Note (Signed)
Transition of Care [facility]) - Progression Note   Patient Details  Name: [name] Morton MRN: [id number] Date of Birth: [birth year]  Transition of Care [facility]) CM/SW [place], LCSW Phone Number: 03/17/2022, 3:59 PM  Clinical Narrative: CSW confirmed with [place] in admissions at [facility] that the facility can accept the patient. Bed offer accepted.  Expected Discharge Plan: [place] Barriers to Discharge: Continued Medical Work up, SNF Pending bed offer  Expected Discharge Plan and Services In-house Referral: Clinical Social Work Post Acute Care Choice: [place] Living arrangements for the past 2 months: Single Family Home              DME Arranged: N/A DME Agency: NA  Social Determinants of Health (SDOH) Interventions SDOH Screenings   Food Insecurity: No Food Insecurity (03/16/2022)  Housing: Low Risk  (03/16/2022)  Transportation Needs: No Transportation Needs (03/16/2022)  Utilities: Not At Risk (03/16/2022)  Depression (PHQ2-9): Low Risk  (04/10/2019)  Tobacco Use: Medium Risk (03/15/2022)   Readmission Risk Interventions     No data to display

## 2022-03-17 NOTE — Plan of Care (Signed)
  Problem: Education: Goal: Knowledge of General Education information will improve Description Including pain rating scale, medication(s)/side effects and non-pharmacologic comfort measures Outcome: Progressing   Problem: Activity: Goal: Risk for activity intolerance will decrease Outcome: Progressing   Problem: Safety: Goal: Ability to remain free from injury will improve Outcome: Progressing   

## 2022-03-17 NOTE — Progress Notes (Signed)
[facility]                                                                              [name] Morton, is a 89 y.o. female, DOB - [birth year], KZS:[id number] Admit date - 03/15/2022    Outpatient Primary MD for the patient is [name], [name], DO  LOS - 2  days  Chief Complaint  Patient presents with   Fatigue       Brief summary   Patient is a 89 year old female with history of right breast CA, CKD stage III, dementia, HTN, HLP, GERD presented from home with a fall.  Patient was brought in by her daughter for worsening altered mental status.  She has been noted to be declining at home.  While in the ER she was noted to have left chest wall bruising.  Imaging showed left clavicle fracture with multiple left-sided rib fractures.  Patient appeared to be in pain with elevated BP. Left arm sling was placed, daughter also reported patient having poor p.o. intake and not taking much fluids. In ED Temp 97.8, RR 22, pulse 61, BP 154/57, O2 sats 92- 100% on room air CMP showed sodium 140, potassium 3.3, BUN 30, creatinine 1.09.  Lipase 141, AST 64, ALT 56, total bilirubin 1.6. CT chest showed midshaft left clavicular fracture.  Left anterior 3rd-5th ribs and posterior lateral 4th-6th sixth ribs fractures  CT abdomen and pelvis showed prominent gallbladder with evidence of intrahepatic and extrahepatic biliary duct dilatation, overall stable since 2016.  1/15: PT recommended SNF  Assessment & Plan    Principal Problem:   Fall, left anterior 3rd-5th, posterolateral 4th-6th Rib fractures -At home, gait instability in the setting of dementia, failure to thrive -Continue pain control, supportive care -PT recommended SNF -Incentive spirometry  Active Problems:   Anterior displaced fracture of sternal end of left clavicle -Continue sling, pain control  Pain control -Pain better controlled today, alert and oriented, son-in-law at the bedside. -Continue current regimen,  tolerating Norco 5/325 1 tab every 4 hours as needed, scheduled Tylenol, Robaxin changed to oral 500 mg 3 times daily, Lidoderm patch -Titrate cautiously to avoid worsening of mental status or respiratory depression -Added bowel regimen    Essential hypertension -BP elevated, resume amlodipine benazepril 10 mg-20 mg daily -Resume Lasix 20 mg daily (at home on 40 mg daily) -Continue bisoprolol 5 mg daily -Added hydralazine IV as needed with parameters  Mild transaminitis -CT abdomen pelvis showed prominent gallbladder with evidence of intrahepatic and extrahepatic biliary ductal dilation overall stable since 2016 -Unclear etiology, amylase lipase improving, patient is tolerating diet, without any significant abdominal pain -CK 732    Diastolic dysfunction with chronic heart failure (HCC) -Currently stable, (on Lasix 40 mg daily at home), will resume 20 mg daily to avoid any fluid overload -Saline lock  Hypokalemia -Replaced    Dementia with behavioral disturbance (HCC) Continue Aricept     AKI (acute kidney injury) ([place]) -Creatinine 1.09 on admission, was 0.84 on 05/22/2021 -Creatinine improved, back to baseline, resuming Lasix 20 mg daily  Estimated body mass index is 24.54 kg/m as calculated from the following:  Height as of this encounter: 5' (1.524 m).   Weight as of this encounter: 57 kg.  Code Status: Full CODE STATUS DVT Prophylaxis:  enoxaparin (LOVENOX) injection 40 mg Start: 03/16/22 2200   Level of Care: Level of care: Med-Surg Family Communication: Updated patient's son-in-law at the bedside today and also updated patient's daughter on the phone   Disposition Plan:      Remains inpatient appropriate: Skilled nursing facility pending   Procedures:  None  Consultants:   None  Antimicrobials: None   Medications  acetaminophen  1,000 mg Oral TID   bisoprolol  5 mg Oral Daily   donepezil  5 mg Oral QHS   enoxaparin (LOVENOX) injection  40 mg  Subcutaneous Q24H   lidocaine  1 patch Transdermal Q24H   methocarbamol  500 mg Oral Q8H      Subjective:   [name] Morton was seen and examined today.  Much more alert and awake today, responding to questions appropriately.  Son-in-law at the bedside.  No fevers or chills, no acute chest pain or shortness of breath.  BP elevated likely due to pain  Objective:   Vitals:   03/16/22 1302 03/16/22 2156 03/17/22 0642 03/17/22 0724  BP: 130/64 (!) 162/48 (!) 206/74 (!) 191/66  Pulse: 62 61 71 66  Resp: '18 18 19 18  '$ Temp: 98.3 F (36.8 C) 98.1 F (36.7 C) 97.6 F (36.4 C) 97.8 F (36.6 C)  TempSrc:  Oral    SpO2: 98% (!) 89% 94% 97%  Weight:      Height:        Intake/Output Summary (Last 24 hours) at 03/17/2022 1358 Last data filed at 03/17/2022 1044 Gross per 24 hour  Intake 1861.1 ml  Output 600 ml  Net 1261.1 ml     Wt Readings from Last 3 Encounters:  03/16/22 57 kg  05/23/21 62.1 kg  05/06/20 62.2 kg    Physical Exam General: Alert and oriented, pleasant, NAD, mental status improving cardiovascular: S1 S2 clear, RRR.  Respiratory: CTAB, no wheezing Gastrointestinal: Soft, nontender, nondistended, NBS Ext: no pedal edema bilaterally Neuro: no new deficits Skin: Left arm sling Psych: mental status improving    Data Reviewed:  I have personally reviewed following labs    CBC Lab Results  Component Value Date   WBC 8.0 03/17/2022   RBC 5.56 (H) 03/17/2022   HGB 16.4 (H) 03/17/2022   HCT 49.1 (H) 03/17/2022   MCV 88.3 03/17/2022   MCH 29.5 03/17/2022   PLT 189 03/17/2022   MCHC 33.4 03/17/2022   RDW 14.1 03/17/2022   LYMPHSABS 1.5 03/15/2022   MONOABS 0.8 03/15/2022   EOSABS 0.0 03/15/2022   BASOSABS 0.0 72/53/6644     Last metabolic panel Lab Results  Component Value Date   NA 138 03/17/2022   K 3.4 (L) 03/17/2022   CL 102 03/17/2022   CO2 27 03/17/2022   BUN 19 03/17/2022   CREATININE 0.78 03/17/2022   GLUCOSE 115 (H) 03/17/2022    GFRNONAA >60 03/17/2022   GFRAA 50 (L) 05/06/2020   CALCIUM 9.0 03/17/2022   PROT 6.6 03/17/2022   ALBUMIN 3.5 03/17/2022   BILITOT 1.6 (H) 03/17/2022   ALKPHOS 121 03/17/2022   AST 105 (H) 03/17/2022   ALT 86 (H) 03/17/2022   ANIONGAP 9 03/17/2022    CBG (last 3)  No results for input(s): "GLUCAP" in the last 72 hours.    Coagulation Profile: No results for input(s): "INR", "PROTIME" in the last  168 hours.   Radiology Studies: I have personally reviewed the imaging studies  CT CHEST ABDOMEN PELVIS W CONTRAST  Result Date: 03/15/2022 CLINICAL DATA:  Increased lethargy and recent fall, initial encounter EXAM: CT CHEST, ABDOMEN, AND PELVIS WITH CONTRAST TECHNIQUE: Multidetector CT imaging of the chest, abdomen and pelvis was performed following the standard protocol during bolus administration of intravenous contrast. RADIATION DOSE REDUCTION: This exam was performed according to the departmental dose-optimization program which includes automated exposure control, adjustment of the mA and/or kV according to patient size and/or use of iterative reconstruction technique. CONTRAST:  171m OMNIPAQUE IOHEXOL 300 MG/ML  SOLN COMPARISON:  Chest x-ray from earlier in the same day, CT from 03/31/2014 FINDINGS: CT CHEST FINDINGS Cardiovascular: Atherosclerotic calcifications are noted. No aneurysmal dilatation or dissection is seen. Coronary calcifications are noted. Pulmonary artery is within normal limits with no large central pulmonary embolus. Timing was not performed for embolus evaluation. Heart is at the upper limits of normal in size. Mediastinum/Nodes: Thoracic inlet is within normal limits. The esophagus is unremarkable as visualized. No hilar or mediastinal adenopathy is noted. Minimal pericardial fluid is noted similar to that seen on prior CT from 2016. Lungs/Pleura: Minimal scarring is noted in the bases bilaterally. No focal infiltrate or effusion is seen. No pneumothorax is noted.  Musculoskeletal: Midshaft left clavicular fracture is noted similar to that seen on prior plain film examination. Mild degenerative changes of the thoracic spine are noted. No compression deformity is seen. There are left-sided rib fractures identified involving the anterior third through fifth ribs as well as the posterolateral fourth through sixth ribs. No complicating factors are noted. CT ABDOMEN PELVIS FINDINGS Hepatobiliary: Gallbladder is well distended. The common bile duct and intrahepatic biliary tree are prominent. The distal common bile duct measures 12 mm with some distal tapering identified. Mild fullness of the proximal pancreatic duct is noted as well. These changes are stable from the prior CT of 2016 and have been evaluated on prior MRI without evidence of common bile duct stone identified. No new stone is identified. Pancreas: Lipoma is noted in the pancreatic head stable from the prior exam. The peripheral pancreatic duct is within normal limits. Spleen: Normal in size without focal abnormality. Adrenals/Urinary Tract: Adrenal glands are within normal limits. Kidneys demonstrate a normal enhancement pattern bilaterally. No renal calculi or obstructive changes are noted. There is a mildly hyperdense cyst identified within the left kidney measuring 2.5 cm in greatest dimension. It shows no significant enhancement on delayed images and is increased in size from a subcentimeter cyst in 2016. No obstructive changes are seen. The bladder is well distended. Stomach/Bowel: Scattered diverticular changes noted within the colon. No findings to suggest diverticulitis are seen. The appendix has been surgically. Small bowel stomach are within normal limits. Vascular/Lymphatic: Aortic atherosclerosis. No enlarged abdominal or pelvic lymph nodes. Reproductive: Status post hysterectomy. No adnexal masses. Other: No abdominal wall hernia or abnormality. No abdominopelvic ascites. Musculoskeletal: No acute or  significant osseous findings. Chronic L1 compression deformity is noted. Changes consistent with prior fracture of the left L3 transverse process with healing. IMPRESSION: CT of the chest: Multiple left-sided rib fractures as described. No pneumothorax is noted. Midshaft left clavicular fracture without complicating factors. No evidence of pulmonary embolus. CT of the abdomen and pelvis: Prominent gallbladder with evidence of intrahepatic and extrahepatic biliary ductal dilatation. The overall appearance is stable from 2016. This has been previously evaluated without evidence of distal common bile duct stone. This is likely the  steady-state for the patient's given age of 47. Diverticulosis without diverticulitis. Hyperdense cyst within the left kidney measuring 2.5 cm. Nonemergent renal ultrasound could be performed to assess the nature of this lesion Electronically Signed   By: Inez Catalina M.D.   On: 03/15/2022 19:20   CT Head Wo Contrast  Result Date: 03/15/2022 CLINICAL DATA:  Fall several days ago.  Altered mental status. EXAM: CT HEAD WITHOUT CONTRAST TECHNIQUE: Contiguous axial images were obtained from the base of the skull through the vertex without intravenous contrast. RADIATION DOSE REDUCTION: This exam was performed according to the departmental dose-optimization program which includes automated exposure control, adjustment of the mA and/or kV according to patient size and/or use of iterative reconstruction technique. COMPARISON:  05/22/2021 FINDINGS: Brain: No evidence of intracranial hemorrhage, acute infarction, hydrocephalus, extra-axial collection, or mass lesion/mass effect. Mild diffuse cerebral atrophy and moderate to severe chronic small vessel disease show no significant change in appearance. Vascular:  No hyperdense vessel or other acute findings. Skull: No evidence of fracture or other significant bone abnormality. Sinuses/Orbits:  No acute findings. Other: None. IMPRESSION: No acute  intracranial abnormality. Stable cerebral atrophy and chronic small vessel disease. Electronically Signed   By: Marlaine Hind M.D.   On: 03/15/2022 16:28   DG Chest 2 View  Result Date: 03/15/2022 CLINICAL DATA:  Chest wall bruising, dementia, left shoulder pain EXAM: CHEST - 2 VIEW COMPARISON:  05/22/2021 chest radiograph. FINDINGS: Surgical clips overlie the right axilla and right upper abdomen. Stable cardiomediastinal silhouette with mild cardiomegaly. No pneumothorax. Chronic moderate elevation of the right hemidiaphragm. New small left pleural effusion. No right pleural effusion. No pulmonary edema. Chronic right lung base atelectasis. New mild compressive lateral left lung base atelectasis. Left mid clavicle shaft fracture with displacement and overriding. Lateral left fourth and fifth rib fractures. These fractures are new. IMPRESSION: 1. Left mid clavicle shaft fracture with displacement and overriding. Lateral left fourth and fifth rib fractures. These fractures are new from 05/22/2021 chest radiograph and potentially acute. 2. No pneumothorax. 3. New small left pleural effusion. 4. Chronic moderate elevation of the right hemidiaphragm with chronic right lung base atelectasis. Electronically Signed   By: Ilona Sorrel M.D.   On: 03/15/2022 15:53   DG Shoulder Left  Result Date: 03/15/2022 CLINICAL DATA:  Dementia, chest wall bruising, shoulder pain EXAM: LEFT SHOULDER - 2+ VIEW COMPARISON:  05/22/2021 chest radiograph. FINDINGS: Left mid clavicular shaft fracture with prominent 4 cm overriding of the fracture fragments and 1.5 cm inferior displacement of the lateral fracture fragment, new. New lateral left fourth, fifth and sixth rib fractures with displaced left fifth rib fracture. No glenohumeral dislocation. No evidence of acromioclavicular separation. No suspicious focal osseous lesions. IMPRESSION: 1. Displaced and overriding left mid clavicular shaft fracture, new since 05/22/2021 chest  radiograph and potentially acute. 2. Lateral left fourth, fifth and sixth rib fractures with displaced left fifth rib fracture, new since 05/22/2021 chest radiograph and potentially acute. Electronically Signed   By: Ilona Sorrel M.D.   On: 03/15/2022 15:50       Aftan Vint M.D. [facility] 03/17/2022, 1:58 PM  Available via Epic secure chat 7am-7pm After 7 pm, please refer to night coverage provider listed on amion.

## 2022-03-18 DIAGNOSIS — R627 Adult failure to thrive: Secondary | ICD-10-CM | POA: Diagnosis not present

## 2022-03-18 LAB — CBC
HCT: 44.6 % (ref 36.0–46.0)
Hemoglobin: 14.8 g/dL (ref 12.0–15.0)
MCH: 29.2 pg (ref 26.0–34.0)
MCHC: 33.2 g/dL (ref 30.0–36.0)
MCV: 88 fL (ref 80.0–100.0)
Platelets: 299 10*3/uL (ref 150–400)
RBC: 5.07 MIL/uL (ref 3.87–5.11)
RDW: 13.9 % (ref 11.5–15.5)
WBC: 11.6 10*3/uL — ABNORMAL HIGH (ref 4.0–10.5)
nRBC: 0 % (ref 0.0–0.2)

## 2022-03-18 LAB — COMPREHENSIVE METABOLIC PANEL
ALT: 90 U/L — ABNORMAL HIGH (ref 0–44)
AST: 60 U/L — ABNORMAL HIGH (ref 15–41)
Albumin: 3.8 g/dL (ref 3.5–5.0)
Alkaline Phosphatase: 141 U/L — ABNORMAL HIGH (ref 38–126)
Anion gap: 14 (ref 5–15)
BUN: 13 mg/dL (ref 8–23)
CO2: 26 mmol/L (ref 22–32)
Calcium: 9.4 mg/dL (ref 8.9–10.3)
Chloride: 96 mmol/L — ABNORMAL LOW (ref 98–111)
Creatinine, Ser: 0.72 mg/dL (ref 0.44–1.00)
GFR, Estimated: >60 mL/min (ref >60)
Glucose, Bld: 105 mg/dL — ABNORMAL HIGH (ref 70–99)
Potassium: 3.6 mmol/L (ref 3.5–5.1)
Sodium: 136 mmol/L (ref 135–145)
Total Bilirubin: 1.4 mg/dL — ABNORMAL HIGH (ref 0.3–1.2)
Total Protein: 7.2 g/dL (ref 6.5–8.1)

## 2022-03-18 MED ORDER — MELATONIN 3 MG PO TABS
3.0000 mg | ORAL_TABLET | Freq: Every day | ORAL | Status: DC
  Administered 2022-03-18 – 2022-03-19 (×2): 3 mg via ORAL
  Filled 2022-03-18 (×2): qty 1

## 2022-03-18 MED ORDER — FUROSEMIDE 20 MG PO TABS: 20.0000 mg | ORAL_TABLET | ORAL | Status: DC

## 2022-03-18 NOTE — TOC Progression Note (Signed)
Transition of Care [facility]) - Progression Note   Patient Details  Name: [name] Morton MRN: [id number] Date of Birth: 1926/08/02  Transition of Care [facility]) CM/SW [place], LCSW Phone Number: 03/18/2022, 2:59 PM  Clinical Narrative: Daughter expressed concerns with the patient going to [facility] as she will not be in a private room, so daughter requested the facility be changed to [facility]. CSW confirmed bed with Lorenza Chick in admissions, but the facility can only hold a private bed until tomorrow. Daughter is aware.  Expected Discharge Plan: [place] Barriers to Discharge: Continued Medical Work up, SNF Pending bed offer  Expected Discharge Plan and Services In-house Referral: Clinical Social Work Post Acute Care Choice: [place] Living arrangements for the past 2 months: Single Family Home              DME Arranged: N/A DME Agency: NA  Social Determinants of Health (SDOH) Interventions SDOH Screenings   Food Insecurity: No Food Insecurity (03/16/2022)  Housing: Low Risk  (03/16/2022)  Transportation Needs: No Transportation Needs (03/16/2022)  Utilities: Not At Risk (03/16/2022)  Depression (PHQ2-9): Low Risk  (04/10/2019)  Tobacco Use: Medium Risk (03/15/2022)   Readmission Risk Interventions     No data to display

## 2022-03-18 NOTE — Plan of Care (Signed)

## 2022-03-18 NOTE — Care Management Important Message (Signed)
Important Message  Patient Details IM Letter placed in Patients room. Name: [name] Morton MRN: [id number] Date of Birth: [birth year]   Medicare Important Message Given:  Yes     Kerin Salen 03/18/2022, 1:14 PM

## 2022-03-18 NOTE — Evaluation (Signed)
Clinical/Bedside Swallow Evaluation Patient Details  Name: [name] Morton MRN: [id number] Date of Birth: Jan 16, 1927  Today's Date: 03/18/2022 Time: SLP Start Time (ACUTE ONLY): 1300 SLP Stop Time (ACUTE ONLY): 1325 SLP Time Calculation (min) (ACUTE ONLY): 25 min  Past Medical History:  Past Medical History:  Diagnosis Date   Breast cancer ([place]) 1997   right - tx with lumpectomty & radiation   Burning sensation of feet    Per New Patient Packet-PSC    Chronic anxiety    Chronic kidney disease    Per New Patient Packet-PSC    Cognitive impairment    Per New Patient Packet-PSC    Diverticulosis    Dyslipidemia    Fluid retention    Per New Patient Packet-PSC    Gait difficulty    GERD (gastroesophageal reflux disease)    RARE - NO MEDICATIONS   Hearing impairment    BILATERAL HEARING AIDS   Heart murmur    Per New Patient Packet-PSC    High cholesterol    Per New Patient Packet-PSC    History of nuclear stress test 11/2009   dipyridamole; no evidence of ischemia, normal, low risk    History of shingles 1998   NO RESIDUAL PROBLEMS   Hypertension    SEES CARDIOLOGIST DR. DAVID HARDING FOR HYPERTENSION    Insomnia    Lymphedema of arm    RIGHT    Mitral and aortic regurgitation    OA (osteoarthritis)    OA AND PAIN LEFT KNEE   Osteopenia    Scoliosis    Per New Patient Packet-PSC    Shortness of breath    WHEN CLIMBING STEPS AT MY HOME--OTHERWISE OK   Sinus problem    Sleep difficulties    ALWAYS HAD SLEEPING PROBLEMS - MEDICATION HELPS PT SLEEP ABOUT 3 HOURS   SOB (shortness of breath)    Per New Patient Packet-PSC, hospitalized 2018    Past Surgical History:  Past Surgical History:  Procedure Laterality Date   ABDOMINAL HYSTERECTOMY  [place]   BREAST LUMPECTOMY Right 1997   COLONOSCOPY  2014   Dr.Sam Penelope Coop, Per New Patient Packet-PSC    EYE SURGERY     BILATERAL CATARACT EXTRACTIONS AND LENS IMPLANTS    NM MYOVIEW LTD  08/2016   Hyperdynamic LV function EF 65%. No ischemia or infarction. LOW RISK   REPLACEMENT TOTAL KNEE  2002   ROTATOR CUFF REPAIR Right 12/2009   Dr. Lenna Sciara. [place]    TOTAL KNEE ARTHROPLASTY Left 05/01/2013   Procedure: LEFT TOTAL KNEE ARTHROPLASTY;  Surgeon: Gearlean Alf, MD;  Location: [facility];  Service: Orthopedics;  Laterality: Left;   TRANSTHORACIC ECHOCARDIOGRAM  05/1994   trace mitral/tricuspic/aortic inusff; hyperkinetic LV   TRANSTHORACIC ECHOCARDIOGRAM  08/2016   Normal LV size. EF 65-70%. GR 1 DD. (Normal for age). Mild aortic stenosis   HPI:  89yo female admitted 03/15/22 with fatigue, FTT at home alone. PMH: right breast cancer, CKD3, dementia, HTN, HLD, GERD. CTHead - no acute intracranial abnormality. Stable cerebral atrophy and chronic small vessel disease.    Assessment / Plan / Recommendation  Clinical Impression  Pt seen at bedside for assessment of swallow function and safety. Pt was awake and alert, pleasant and cooperative. Caregiver was present. Pt presents with adequate natural dentition. CN exam is unremarkable. Pt accepted boluses of thin liquid, puree, and solid textures. Timely oral prep and clearing, without  oral residue or anterior leakage. No overt s/s aspiration observed or reported. Recommend continuing current diet (regular/thin liquids). No further ST intervention recommended at this time. Thank you for this referral.  SLP Visit Diagnosis: Dysphagia, unspecified (R13.10)    Aspiration Risk  Mild aspiration risk    Diet Recommendation Regular;Thin liquid   Liquid Administration via: Straw;Cup Medication Administration:  (as tolerated) Supervision: Patient able to self feed Compensations: Minimize environmental distractions;Slow rate;Small sips/bites Postural Changes: Seated upright at 90 degrees;Remain upright for at least 30 minutes after po intake    Other  Recommendations Oral Care Recommendations: Oral care BID     Recommendations for follow up therapy are one component of a multi-disciplinary discharge planning process, led by the attending physician.  Recommendations may be updated based on patient status, additional functional criteria and insurance authorization.  Follow up Recommendations No SLP follow up      Assistance Recommended at Discharge 24 hour supervision      Prognosis Prognosis for Safe Diet Advancement: Good      Swallow Study   General Date of Onset: 03/15/22 HPI: 89yo female admitted 03/15/22 with fatigue, FTT at home alone. PMH: right breast cancer, CKD3, dementia, HTN, HLD, GERD. CTHead - no acute intracranial abnormality. Stable cerebral atrophy and chronic small vessel disease. Type of Study: Bedside Swallow Evaluation Previous Swallow Assessment: none Diet Prior to this Study: Regular;Thin liquids Temperature Spikes Noted: No Respiratory Status: Room air History of Recent Intubation: No Behavior/Cognition: Alert;Cooperative;Pleasant mood;Confused Oral Cavity Assessment: Within Functional Limits Oral Care Completed by SLP: No Oral Cavity - Dentition: Adequate natural dentition Vision: Functional for self-feeding Self-Feeding Abilities: Needs assist Patient Positioning: Upright in bed Baseline Vocal Quality: Normal Volitional Cough: Cognitively unable to elicit Volitional Swallow: Unable to elicit    Oral/Motor/Sensory Function Overall Oral Motor/Sensory Function: Within functional limits   Ice Chips Ice chips: Not tested   Thin Liquid Thin Liquid: Within functional limits Presentation: Straw    Nectar Thick Nectar Thick Liquid: Not tested   Honey Thick Honey Thick Liquid: Not tested   Puree Puree: Within functional limits Presentation: Spoon   Solid     Solid: Within functional limits Presentation: [place] Quentin Ore, [facility], [place] Speech Language Pathologist Office: [phone]  Shonna Chock 03/18/2022,1:31 PM

## 2022-03-18 NOTE — Plan of Care (Signed)
  Problem: Activity: Goal: Risk for activity intolerance will decrease Outcome: Progressing   Problem: Nutrition: Goal: Adequate nutrition will be maintained Outcome: Progressing   

## 2022-03-18 NOTE — Progress Notes (Signed)
[facility]                                                                              Gabrianna [place] Gilford Rile, is a 89 y.o. female, DOB - 1926-08-01, HKV:[id number] Admit date - 03/15/2022    Outpatient Primary MD for the patient is [name], [name], DO  LOS - 3  days  Chief Complaint  Patient presents with   Fatigue       Brief summary   Patient is a 89 year old female with history of right breast CA, CKD stage III, dementia, HTN, HLP, GERD presented from home with a fall.  Patient was brought in by her daughter for worsening altered mental status.  She has been noted to be declining at home.  While in the ER she was noted to have left chest wall bruising.  Imaging showed left clavicle fracture with multiple left-sided rib fractures.  Patient appeared to be in pain with elevated BP. Left arm sling was placed, daughter also reported patient having poor p.o. intake and not taking much fluids. In ED Temp 97.8, RR 22, pulse 61, BP 154/57, O2 sats 92- 100% on room air CMP showed sodium 140, potassium 3.3, BUN 30, creatinine 1.09.  Lipase 141, AST 64, ALT 56, total bilirubin 1.6. CT chest showed midshaft left clavicular fracture.  Left anterior 3rd-5th ribs and posterior lateral 4th-6th sixth ribs fractures  CT abdomen and pelvis showed prominent gallbladder with evidence of intrahepatic and extrahepatic biliary duct dilatation, overall stable since 2016.  1/15: PT recommended SNF  Assessment & Plan    Principal Problem:   Fall, left anterior 3rd-5th, posterolateral 4th-6th Rib fractures -At home, gait instability in the setting of dementia ( uses a walker for the last few years), failure to thrive -Continue pain control, supportive care -PT recommended SNF -Incentive spirometry not sure if able to use due to dementia  Active Problems:   Anterior displaced fracture of sternal end of left clavicle -Continue sling, pain control  Pain control -Pain better controlled  today, alert and oriented, son-in-law at the bedside. -Continue current regimen, tolerating Norco 5/325 1 tab every 4 hours as needed, scheduled Tylenol, Robaxin changed to oral 500 mg 3 times daily, Lidoderm patch -Titrate cautiously to avoid worsening of mental status or respiratory depression -Added bowel regimen    Essential hypertension/chronic diastolic chf -BP elevated, resume amlodipine benazepril 10 mg-20 mg daily,Continue bisoprolol 5 mg daily -reduce lasix frequency, as she has poor oral intake and signs of dehydration, may need to change to prn for edema -continue hydralazine IV as needed with parameters  AKI (acute kidney injury) ([place]) -Creatinine 1.09 on admission, was 0.84 on 05/22/2021 -Creatinine improved, today is 0.72,  reduce  Lasix , likely need to change to prn  Mild transaminitis -CT abdomen pelvis showed prominent gallbladder with evidence of intrahepatic and extrahepatic biliary ductal dilation overall stable since 2016 -Unclear etiology, amylase lipase improving, patient is tolerating diet, without any significant abdominal pain -CK 209 -daughter desires GI consult, [facility]  will see in consult on 1/18     Hypokalemia -Replaced    Dementia with behavioral disturbance (  [place]) Continue Aricept       Estimated body mass index is 24.54 kg/m as calculated from the following:   Height as of this encounter: 5' (1.524 m).   Weight as of this encounter: 57 kg.  Code Status: Full CODE STATUS DVT Prophylaxis:  enoxaparin (LOVENOX) injection 40 mg Start: 03/16/22 2200   Level of Care: Level of care: Med-Surg Family Communication: Updated patient's son-in-law at the bedside today and also updated patient's daughter on the phone   Disposition Plan:      Remains inpatient appropriate: Skilled nursing facility pending , daughter requested gi consult  Procedures:  None  Consultants:   [facility]  Antimicrobials: None   Medications  acetaminophen  1,000 mg  Oral TID   amLODipine  10 mg Oral Daily   And   benazepril  20 mg Oral Daily   bisoprolol  5 mg Oral Daily   donepezil  5 mg Oral QHS   enoxaparin (LOVENOX) injection  40 mg Subcutaneous Q24H   furosemide  20 mg Oral Daily   lidocaine  1 patch Transdermal Q24H   melatonin  3 mg Oral QHS   methocarbamol  500 mg Oral Q8H   senna-docusate  2 tablet Oral QHS      Subjective:   Son in law states she was up several times to use the bedside commode, she was asleep when I walked into her room, later she woke up pleasant, appears recognizing her family member, does not follow command consistently Family reports her oral intake has been poor, she seems to have trouble swallowing, her weight is down from 134 to 125 in the last few months, speech eval ordered   No fevers or chills, no acute chest pain or shortness of breath.   BP elevated , from pain? Currently she does not appear in pain  Objective:   Vitals:   03/17/22 0642 03/17/22 0724 03/17/22 2118 03/18/22 0715  BP: (!) 206/74 (!) 191/66 (!) 192/66 (!) 193/74  Pulse: 71 66 68 87  Resp: '19 18 16 16  '$ Temp: 97.6 F (36.4 C) 97.8 F (36.6 C) 97.9 F (36.6 C) 98.1 F (36.7 C)  TempSrc:    Oral  SpO2: 94% 97% 97% 95%  Weight:      Height:        Intake/Output Summary (Last 24 hours) at 03/18/2022 1101 Last data filed at 03/18/2022 1000 Gross per 24 hour  Intake 150 ml  Output --  Net 150 ml     Wt Readings from Last 3 Encounters:  03/16/22 57 kg  05/23/21 62.1 kg  05/06/20 62.2 kg    Physical Exam General: Alert and oriented to person, pleasant cardiovascular: S1 S2 clear, RRR.  Respiratory: CTAB, no wheezing Gastrointestinal: Soft, nontender, nondistended, NBS Ext: no pedal edema bilaterally Neuro: no new deficits Skin: Left arm sling Psych: she is calm and pleasant this am    Data Reviewed:  I have personally reviewed following labs    CBC Lab Results  Component Value Date   WBC 11.6 (H) 03/18/2022    RBC 5.07 03/18/2022   HGB 14.8 03/18/2022   HCT 44.6 03/18/2022   MCV 88.0 03/18/2022   MCH 29.2 03/18/2022   PLT 299 03/18/2022   MCHC 33.2 03/18/2022   RDW 13.9 03/18/2022   LYMPHSABS 1.5 03/15/2022   MONOABS 0.8 03/15/2022   EOSABS 0.0 03/15/2022   BASOSABS 0.0 16/60/6301     Last metabolic panel Lab Results  Component Value Date  NA 136 03/18/2022   K 3.6 03/18/2022   CL 96 (L) 03/18/2022   CO2 26 03/18/2022   BUN 13 03/18/2022   CREATININE 0.72 03/18/2022   GLUCOSE 105 (H) 03/18/2022   GFRNONAA >60 03/18/2022   GFRAA 50 (L) 05/06/2020   CALCIUM 9.4 03/18/2022   PROT 7.2 03/18/2022   ALBUMIN 3.8 03/18/2022   BILITOT 1.4 (H) 03/18/2022   ALKPHOS 141 (H) 03/18/2022   AST 60 (H) 03/18/2022   ALT 90 (H) 03/18/2022   ANIONGAP 14 03/18/2022    CBG (last 3)  No results for input(s): "GLUCAP" in the last 72 hours.    Coagulation Profile: No results for input(s): "INR", "PROTIME" in the last 168 hours.   Radiology Studies: I have personally reviewed the imaging studies  No results found.     Florencia Reasons MD. PhD FACP [facility] 03/18/2022, 11:01 AM  Available via Epic secure chat 7am-7pm After 7 pm, please refer to night coverage provider listed on amion.

## 2022-03-19 DIAGNOSIS — R627 Adult failure to thrive: Secondary | ICD-10-CM | POA: Diagnosis not present

## 2022-03-19 LAB — COMPREHENSIVE METABOLIC PANEL
ALT: 61 U/L — ABNORMAL HIGH (ref 0–44)
AST: 30 U/L (ref 15–41)
Albumin: 3.2 g/dL — ABNORMAL LOW (ref 3.5–5.0)
Alkaline Phosphatase: 130 U/L — ABNORMAL HIGH (ref 38–126)
Anion gap: 12 (ref 5–15)
BUN: 22 mg/dL (ref 8–23)
CO2: 28 mmol/L (ref 22–32)
Calcium: 9.4 mg/dL (ref 8.9–10.3)
Chloride: 94 mmol/L — ABNORMAL LOW (ref 98–111)
Creatinine, Ser: 0.88 mg/dL (ref 0.44–1.00)
GFR, Estimated: >60 mL/min (ref >60)
Glucose, Bld: 120 mg/dL — ABNORMAL HIGH (ref 70–99)
Potassium: 3.2 mmol/L — ABNORMAL LOW (ref 3.5–5.1)
Sodium: 134 mmol/L — ABNORMAL LOW (ref 135–145)
Total Bilirubin: 0.9 mg/dL (ref 0.3–1.2)
Total Protein: 6.9 g/dL (ref 6.5–8.1)

## 2022-03-19 LAB — CBC WITH DIFFERENTIAL/PLATELET
Abs Immature Granulocytes: 0.04 10*3/uL (ref 0.00–0.07)
Basophils Absolute: 0 10*3/uL (ref 0.0–0.1)
Basophils Relative: 0 %
Eosinophils Absolute: 0.1 10*3/uL (ref 0.0–0.5)
Eosinophils Relative: 1 %
HCT: 41.9 % (ref 36.0–46.0)
Hemoglobin: 14.2 g/dL (ref 12.0–15.0)
Immature Granulocytes: 0 %
Lymphocytes Relative: 21 %
Lymphs Abs: 2 10*3/uL (ref 0.7–4.0)
MCH: 29.4 pg (ref 26.0–34.0)
MCHC: 33.9 g/dL (ref 30.0–36.0)
MCV: 86.7 fL (ref 80.0–100.0)
Monocytes Absolute: 0.7 10*3/uL (ref 0.1–1.0)
Monocytes Relative: 8 %
Neutro Abs: 6.6 10*3/uL (ref 1.7–7.7)
Neutrophils Relative %: 70 %
Platelets: 312 10*3/uL (ref 150–400)
RBC: 4.83 MIL/uL (ref 3.87–5.11)
RDW: 13.8 % (ref 11.5–15.5)
WBC: 9.5 10*3/uL (ref 4.0–10.5)
nRBC: 0 % (ref 0.0–0.2)

## 2022-03-19 LAB — PHOSPHORUS: Phosphorus: 3.9 mg/dL (ref 2.5–4.6)

## 2022-03-19 LAB — MAGNESIUM: Magnesium: 2.2 mg/dL (ref 1.7–2.4)

## 2022-03-19 LAB — LIPASE, BLOOD: Lipase: 46 U/L (ref 11–51)

## 2022-03-19 MED ORDER — POLYETHYLENE GLYCOL 3350 17 G PO PACK
17.0000 g | PACK | Freq: Every day | ORAL | Status: DC
  Filled 2022-03-19: qty 1

## 2022-03-19 MED ORDER — POTASSIUM CHLORIDE 20 MEQ PO PACK
40.0000 meq | PACK | Freq: Once | ORAL | Status: AC
  Administered 2022-03-19: 40 meq via ORAL
  Filled 2022-03-19: qty 2

## 2022-03-19 MED ORDER — ENSURE ENLIVE PO LIQD
237.0000 mL | Freq: Two times a day (BID) | ORAL | Status: DC
  Administered 2022-03-19 – 2022-03-20 (×2): 237 mL via ORAL

## 2022-03-19 MED ORDER — LABETALOL HCL 5 MG/ML IV SOLN
5.0000 mg | INTRAVENOUS | Status: DC | PRN
  Administered 2022-03-19 – 2022-03-20 (×2): 5 mg via INTRAVENOUS
  Filled 2022-03-19 (×2): qty 4

## 2022-03-19 NOTE — TOC Progression Note (Signed)
Transition of Care [facility]) - Progression Note   Patient Details  Name: [name] Morton MRN: [id number] Date of Birth: 13-Apr-1926  Transition of Care [facility]) CM/SW [place], LCSW Phone Number: 03/19/2022, 2:19 PM  Clinical Narrative: Patient's daughter wanted to change SNFs from [facility] to [facility]. CSW followed up with Kitty in admissions at [facility] and the facility can accept the patient tomorrow, but it will be a semiprivate room until a private room becomes available. Daughter is agreeable to this. Bed offer accepted. CSW notified Lorenza Chick in admissions at [facility] that patient will now discharge to another facility.    Expected Discharge Plan: [place] Barriers to Discharge: Continued Medical Work up, SNF Pending bed offer  Expected Discharge Plan and Services In-house Referral: Clinical Social Work Post Acute Care Choice: [place] Living arrangements for the past 2 months: Single Family Home            DME Arranged: N/A DME Agency: NA  Social Determinants of Health (SDOH) Interventions SDOH Screenings   Food Insecurity: No Food Insecurity (03/16/2022)  Housing: Low Risk  (03/16/2022)  Transportation Needs: No Transportation Needs (03/16/2022)  Utilities: Not At Risk (03/16/2022)  Depression (PHQ2-9): Low Risk  (04/10/2019)  Tobacco Use: Medium Risk (03/15/2022)   Readmission Risk Interventions     No data to display

## 2022-03-19 NOTE — Progress Notes (Signed)
PT Cancellation Note  Patient Details Name: [name] Morton MRN: [id number] DOB: [birth year]   Cancelled Treatment:    Reason Eval/Treat Not Completed: Medical issues which prohibited therapy (elevated BP)   Kati L Payson 03/19/2022, 12:07 PM Arlyce Dice, DPT Physical Therapist Acute Rehabilitation Services Preferred contact method: Secure Chat Weekend Pager Only: [phone] Office: [phone]

## 2022-03-19 NOTE — Progress Notes (Signed)
[facility]                                                                              [name] Morton, is a 89 y.o. female, DOB - 11-Apr-1926, ZRA:[id number] Admit date - 03/15/2022    Outpatient Primary MD for the patient is [name], [name], DO  LOS - 4  days  Chief Complaint  Patient presents with   Fatigue       Brief summary   Patient is a 89 year old female with history of right breast CA, CKD stage III, dementia, HTN, HLP, GERD presented from home with a fall.  Patient was brought in by her daughter for worsening altered mental status.  She has been noted to be declining at home.  While in the ER she was noted to have left chest wall bruising.  Imaging showed left clavicle fracture with multiple left-sided rib fractures.  Patient appeared to be in pain with elevated BP. Left arm sling was placed, daughter also reported patient having poor p.o. intake and not taking much fluids. In ED Temp 97.8, RR 22, pulse 61, BP 154/57, O2 sats 92- 100% on room air CMP showed sodium 140, potassium 3.3, BUN 30, creatinine 1.09.  Lipase 141, AST 64, ALT 56, total bilirubin 1.6. CT chest showed midshaft left clavicular fracture.  Left anterior 3rd-5th ribs and posterior lateral 4th-6th sixth ribs fractures  CT abdomen and pelvis showed prominent gallbladder with evidence of intrahepatic and extrahepatic biliary duct dilatation, overall stable since 2016.  1/15: PT recommended SNF  Assessment & Plan    Principal Problem:   Fall, left anterior 3rd-5th, posterolateral 4th-6th Rib fractures Anterior displaced fracture of sternal end of left clavicle, Continue sling, pain control -At home, gait instability in the setting of dementia ( uses a walker for the last few years), failure to thrive -Continue pain control, supportive care,  tolerating Norco 5/325 1 tab every 4 hours as needed, scheduled Tylenol, Robaxin oral 500 mg 3 times daily, Lidoderm patch -Titrate cautiously to  avoid worsening of mental status or respiratory depression -continue  bowel regimen -PT recommended SNF -Incentive spirometry not sure if able to use due to dementia    Essential hypertension/chronic diastolic chf -BP elevated, partly due to pain and intermittent agitation -continue home meds amlodipine benazepril 10 mg-20 mg daily,Continue bisoprolol 5 mg daily -reduce lasix frequency, as she has poor oral intake and signs of dehydration, may need to change to prn for edema -continue hydralazine IV as needed and prn labetalol with parameters  AKI (acute kidney injury) ([place]) -Creatinine 1.09 on admission, was 0.84 on 05/22/2021 -Creatinine improved, today is 0.72,  reduce  Lasix , likely need to change to prn  Mild transaminitis -CT abdomen pelvis showed prominent gallbladder with evidence of intrahepatic and extrahepatic biliary ductal dilation overall stable since 2016 -Unclear etiology, amylase lipase improving, patient is tolerating diet, without any significant abdominal pain -CK 209 -lft coming down, [facility]  consulted per daughter's request, conservative management recommended     Hypokalemia -Likely from poor oral intake and Lasix use , remain low , continue to replace  Dementia with behavioral disturbance (HCC) Continue Aricept   Poor oral intake, encourage Ensure nutrition supplement, consider appetite booster  Estimated body mass index is 24.54 kg/m as calculated from the following:   Height as of this encounter: 5' (1.524 m).   Weight as of this encounter: 57 kg.  Code Status: Full CODE STATUS DVT Prophylaxis:  enoxaparin (LOVENOX) injection 40 mg Start: 03/16/22 2200   Level of Care: Level of care: Med-Surg Family Communication: Updated patient's son-in-law and daughter at the bedside    Disposition Plan:     SNF on 1/19  Procedures:  None  Consultants:   [facility]  Antimicrobials: None   Medications  acetaminophen  1,000 mg Oral TID    amLODipine  10 mg Oral Daily   And   benazepril  20 mg Oral Daily   bisoprolol  5 mg Oral Daily   donepezil  5 mg Oral QHS   enoxaparin (LOVENOX) injection  40 mg Subcutaneous Q24H   feeding supplement  237 mL Oral BID BM   [START ON 03/20/2022] furosemide  20 mg Oral Q M,W,F   lidocaine  1 patch Transdermal Q24H   melatonin  3 mg Oral QHS   methocarbamol  500 mg Oral Q8H   senna-docusate  2 tablet Oral QHS      Subjective:   She is oriented to person, currently does not appear in pain, she is hard of hearing, she has poor oral intake, there is no fever, no sob, no hypoxia Speech evaluated her and recommended regular diet  Son and daughter at bedside    Objective:   Vitals:   03/19/22 0624 03/19/22 0745 03/19/22 1000 03/19/22 1240  BP: (!) 206/76 (!) 173/76 (!) 178/62 (!) 172/65  Pulse: 86 (!) 57 76 70  Resp: '19 17 16 18  '$ Temp: 97.9 F (36.6 C) 98 F (36.7 C) 97.8 F (36.6 C) 97.6 F (36.4 C)  TempSrc:   Oral   SpO2: 97% 93% 94% 95%  Weight:      Height:       No intake or output data in the 24 hours ending 03/19/22 1446    Wt Readings from Last 3 Encounters:  03/16/22 57 kg  05/23/21 62.1 kg  05/06/20 62.2 kg    Physical Exam General: Alert and oriented to person, currently on agitation  cardiovascular: S1 S2 clear, RRR.  Respiratory: CTAB, no wheezing Gastrointestinal: Soft, nontender, nondistended, NBS Ext: no pedal edema bilaterally Neuro: no new deficits Skin: Left arm sling Psych: No agitation    Data Reviewed:  I have personally reviewed following labs    CBC Lab Results  Component Value Date   WBC 9.5 03/19/2022   RBC 4.83 03/19/2022   HGB 14.2 03/19/2022   HCT 41.9 03/19/2022   MCV 86.7 03/19/2022   MCH 29.4 03/19/2022   PLT 312 03/19/2022   MCHC 33.9 03/19/2022   RDW 13.8 03/19/2022   LYMPHSABS 2.0 03/19/2022   MONOABS 0.7 03/19/2022   EOSABS 0.1 03/19/2022   BASOSABS 0.0 86/57/8469     Last metabolic panel Lab Results   Component Value Date   NA 134 (L) 03/19/2022   K 3.2 (L) 03/19/2022   CL 94 (L) 03/19/2022   CO2 28 03/19/2022   BUN 22 03/19/2022   CREATININE 0.88 03/19/2022   GLUCOSE 120 (H) 03/19/2022   GFRNONAA >60 03/19/2022   GFRAA 50 (L) 05/06/2020   CALCIUM 9.4 03/19/2022   PHOS 3.9 03/19/2022   PROT 6.9  03/19/2022   ALBUMIN 3.2 (L) 03/19/2022   BILITOT 0.9 03/19/2022   ALKPHOS 130 (H) 03/19/2022   AST 30 03/19/2022   ALT 61 (H) 03/19/2022   ANIONGAP 12 03/19/2022    CBG (last 3)  No results for input(s): "GLUCAP" in the last 72 hours.    Coagulation Profile: No results for input(s): "INR", "PROTIME" in the last 168 hours.   Radiology Studies: I have personally reviewed the imaging studies  No results found.     Florencia Reasons MD. PhD FACP [facility] 03/19/2022, 2:46 PM  Available via Epic secure chat 7am-7pm After 7 pm, please refer to night coverage provider listed on amion.

## 2022-03-19 NOTE — Progress Notes (Signed)
   03/19/22 6237  Provider Notification  Provider Name/Title Zebedee Iba NP  Date Provider Notified 03/19/22  Time Provider Notified [phone]  Method of Notification  (secure chat)  Notification Reason Change in status  Provider response Other (Comment) (await)  Rapid Response Notification  Name of Rapid Response RN Notified Mandy RN  Date Rapid Response Notified 03/19/22  Time Rapid Response Notified 0652   Also pain medication, Vicodin 5/325 and robaxin 500 mg 1 tab each given to help decrease pain

## 2022-03-19 NOTE — Consult Note (Signed)
Referring Provider: [facility] Primary Care Physician:  Gayland Curry, DO Primary Gastroenterologist: Althia Forts  Reason for Consultation: Poor appetite, weight loss, failure to thrive, evaluate for possible gallbladder issues  HPI: [name] Morton is a 89 y.o. female with past medical history of right breast cancer, dementia, chronic kidney disease presented to the hospital with fatigue and altered mental status.  Further workup with CT chest abdomen pelvis with IV contrast showed multiple left-sided rib fracture and left clavicular fracture.  It also showed prominent gallbladder with intra and extrahepatic biliary dilation stable since 2016.  GI is consulted for further evaluation of poor appetite and weight loss.  Seen by speech therapy and initial swallow evaluation was normal.  Recommended regular diet with thin liquids.  Patient seen and examined at bedside.  Patient's caregiver is at bedside.  According to her patient is not eating much for last 1 year.  Besides constipation caregiver denies any GI symptoms.  Called and discussed with patient's daughter.  Looks like patient having some regurgitation and reflux symptoms.  Questionable improvement in appetite with use of Remeron in the past.  Blood work showed mild elevated ALT at 61, mild elevated alkaline phosphatase is 130, normal CBC, kidney hepatitis panel, normal lipase.  Lipase was elevated on admission at 285.  Past Medical History:  Diagnosis Date   Breast cancer ([place]) 1997   right - tx with lumpectomty & radiation   Burning sensation of feet    Per New Patient Packet-PSC    Chronic anxiety    Chronic kidney disease    Per New Patient Packet-PSC    Cognitive impairment    Per New Patient Packet-PSC    Diverticulosis    Dyslipidemia    Fluid retention    Per New Patient Packet-PSC    Gait difficulty    GERD (gastroesophageal reflux disease)    RARE - NO MEDICATIONS   Hearing impairment    BILATERAL HEARING AIDS   Heart  murmur    Per New Patient Packet-PSC    High cholesterol    Per New Patient Packet-PSC    History of nuclear stress test 11/2009   dipyridamole; no evidence of ischemia, normal, low risk    History of shingles 1998   NO RESIDUAL PROBLEMS   Hypertension    SEES CARDIOLOGIST DR. DAVID HARDING FOR HYPERTENSION    Insomnia    Lymphedema of arm    RIGHT    Mitral and aortic regurgitation    OA (osteoarthritis)    OA AND PAIN LEFT KNEE   Osteopenia    Scoliosis    Per New Patient Packet-PSC    Shortness of breath    WHEN CLIMBING STEPS AT MY HOME--OTHERWISE OK   Sinus problem    Sleep difficulties    ALWAYS HAD SLEEPING PROBLEMS - MEDICATION HELPS PT SLEEP ABOUT 3 HOURS   SOB (shortness of breath)    Per New Patient Packet-PSC, hospitalized 2018     Past Surgical History:  Procedure Laterality Date   ABDOMINAL HYSTERECTOMY  [place]   BREAST LUMPECTOMY Right 1997   COLONOSCOPY  2014   Dr.Sam Penelope Coop, Per New Patient Packet-PSC    EYE SURGERY     BILATERAL CATARACT EXTRACTIONS AND LENS IMPLANTS   NM MYOVIEW LTD  08/2016   Hyperdynamic LV function EF 65%. No ischemia or infarction. LOW RISK   REPLACEMENT TOTAL KNEE  2002  ROTATOR CUFF REPAIR Right 12/2009   Dr. Lenna Sciara. [place]    TOTAL KNEE ARTHROPLASTY Left 05/01/2013   Procedure: LEFT TOTAL KNEE ARTHROPLASTY;  Surgeon: Gearlean Alf, MD;  Location: [facility];  Service: Orthopedics;  Laterality: Left;   TRANSTHORACIC ECHOCARDIOGRAM  05/1994   trace mitral/tricuspic/aortic inusff; hyperkinetic LV   TRANSTHORACIC ECHOCARDIOGRAM  08/2016   Normal LV size. EF 65-70%. GR 1 DD. (Normal for age). Mild aortic stenosis    Prior to Admission medications   Medication Sig Start Date End Date Taking? Authorizing Provider  acetaminophen (TYLENOL) 650 MG CR tablet Take 1,300 mg by mouth daily.   Yes [provider]  amLODipine-benazepril (LOTREL) 10-20 MG capsule Take 1 capsule by  mouth daily.   Yes [provider]  bisoprolol (ZEBETA) 5 MG tablet TAKE 1 TABLET DAILY Patient taking differently: Take 5 mg by mouth daily. 04/12/20  Yes Reed, [name], DO  cetirizine (ZYRTEC) 10 MG tablet Take 10 mg by mouth daily.   Yes [provider]  donepezil (ARICEPT) 5 MG tablet TAKE 1 TABLET AT BEDTIME FOR MEMORY LOSS Patient taking differently: Take 5 mg by mouth at bedtime. 04/17/20  Yes Reed, [name], DO  DULoxetine 40 MG CPEP Take 40 mg by mouth daily. 11/06/21  Yes Reed, [name], DO  furosemide (LASIX) 40 MG tablet TAKE 1 TABLET DAILY 11/06/21  Yes Reed, [name], DO  gabapentin (NEURONTIN) 300 MG capsule Take 1 capsule (300 mg total) by mouth at bedtime. 11/06/21  Yes Reed, [name], DO  amLODipine-benazepril (LOTREL) 10-20 MG capsule TAKE 1 CAPSULE BY MOUTH DAILY. 07/03/20 07/03/21  Wardell Honour, MD  potassium chloride SA (KLOR-CON) 20 MEQ tablet Take 1 tablet (20 mEq total) by mouth as needed. Only when takes Lasix Patient not taking: Reported on 03/15/2022 05/07/20   Hollace Kinnier L, DO    Scheduled Meds:  acetaminophen  1,000 mg Oral TID   amLODipine  10 mg Oral Daily   And   benazepril  20 mg Oral Daily   bisoprolol  5 mg Oral Daily   donepezil  5 mg Oral QHS   enoxaparin (LOVENOX) injection  40 mg Subcutaneous Q24H   feeding supplement  237 mL Oral BID BM   [START ON 03/20/2022] furosemide  20 mg Oral Q M,W,F   lidocaine  1 patch Transdermal Q24H   melatonin  3 mg Oral QHS   methocarbamol  500 mg Oral Q8H   senna-docusate  2 tablet Oral QHS   Continuous Infusions: PRN Meds:.acetaminophen, hydrALAZINE, HYDROcodone-acetaminophen, morphine injection, ondansetron **OR** ondansetron (ZOFRAN) IV, polyethylene glycol  Allergies as of 03/15/2022 - Review Complete 03/15/2022  Allergen Reaction Noted   Benadryl [diphenhydramine hcl (sleep)]  12/23/2012   Cardizem [diltiazem hcl]  12/23/2012   Ceftin [cefuroxime axetil] Other (See Comments) 07/30/2017    Epinephrine Other (See Comments) 04/25/2013   Oxycodone hcl  09/06/2017   Tetanus toxoids Other (See Comments) 07/30/2017   Tramadol hcl  09/06/2017   Vistaril [hydroxyzine] Other (See Comments) 04/25/2013   Vytorin [ezetimibe-simvastatin]  12/23/2012    Family History  Problem Relation Age of Onset   Heart attack Mother    Arthritis Mother    Dementia Mother    Heart Problems Father    Stroke Father    Heart Problems Maternal Grandmother    Heart Problems Maternal Grandfather    Asthma Maternal Grandfather    Heart attack Brother 27   Heart disease Brother  CABG   Lung cancer Sister    Emphysema Sister    Cancer Sister    Narcolepsy Child    Arthritis Child    High blood pressure Child    Alcoholism Brother    Post-traumatic stress disorder Brother    COPD Sister    Seizures Sister    Heart Problems Sister     Social History   Socioeconomic History   Marital status: Widowed    Spouse name: Not on file   Number of children: 2   Years of education: Not on file   Highest education level: Not on file  Occupational History   Occupation: retired Therapist, sports  Tobacco Use   Smoking status: Former    Years: 5.00    Types: Cigarettes   Smokeless tobacco: Never  Vaping Use   Vaping Use: Never used  Substance and Sexual Activity   Alcohol use: No   Drug use: No   Sexual activity: Not Currently  Other Topics Concern   Not on file  Social History Narrative   Widowed since 11/2010.  Mother of 2.  No real exercise due to knee.   Smoki: No   EtOH: NO      As of 07/30/17-PSC:   Diet: N/A      Caffeine: Coffee, Dr.Pepper      Married, if yes what year: Widowed      Do you live in a house, apartment, assisted living, condo, trailer, ect: House, more than 1 stories, split level 1 person       Pets: No       Highest Level of education: Nursing School in [place], Therapist, sports       Current/Past profession: RN      Exercise: No         Living Will: Yes   DNR: No   POA/HPOA:  Yes      Functional Status:   Do you have difficulty bathing or dressing yourself? No   Do you have difficulty preparing food or eating? Yes   Do you have difficulty managing your medications? Yes   Do you have difficulty managing your finances? Yes   Do you have difficulty affording your medications? Yes   Social Determinants of Health   Financial Resource Strain: Not on file  Food Insecurity: No Food Insecurity (03/16/2022)   Hunger Vital Sign    Worried About Running Out of Food in the Last Year: Never true    Ran Out of Food in the Last Year: Never true  Transportation Needs: No Transportation Needs (03/16/2022)   PRAPARE - Hydrologist (Medical): No    Lack of Transportation (Non-Medical): No  Physical Activity: Not on file  Stress: Not on file  Social Connections: Not on file  Intimate Partner Violence: Not At Risk (03/16/2022)   Humiliation, Afraid, Rape, and Kick questionnaire    Fear of Current or Ex-Partner: No    Emotionally Abused: No    Physically Abused: No    Sexually Abused: No    Review of Systems: Not able to obtain because of dementia.  Physical Exam: Vital signs: Vitals:   03/19/22 1000 03/19/22 1240  BP: (!) 178/62 (!) 172/65  Pulse: 76 70  Resp: 16 18  Temp: 97.8 F (36.6 C) 97.6 F (36.4 C)  SpO2: 94% 95%   Last BM Date : 03/16/22 Elderly patient, not in acute distress.  Abdominal exam is benign.  Abdomen is soft, nontender, nondistended, bowel sounds  present, no peritoneal signs.  GI:  Lab Results: Recent Labs    03/17/22 0331 03/18/22 0338 03/19/22 0313  WBC 8.0 11.6* 9.5  HGB 16.4* 14.8 14.2  HCT 49.1* 44.6 41.9  PLT 189 299 312   BMET Recent Labs    03/17/22 0331 03/18/22 0338 03/19/22 0313  NA 138 136 134*  K 3.4* 3.6 3.2*  CL 102 96* 94*  CO2 '27 26 28  '$ GLUCOSE 115* 105* 120*  BUN '19 13 22  '$ CREATININE 0.78 0.72 0.88  CALCIUM 9.0 9.4 9.4   LFT Recent Labs    03/19/22 0313  PROT 6.9   ALBUMIN 3.2*  AST 30  ALT 61*  ALKPHOS 130*  BILITOT 0.9   PT/INR No results for input(s): "LABPROT", "INR" in the last 72 hours.   Studies/Results: No results found.  Impression/Plan: -Decreased appetite and poor oral intake.  Likely multifactorial and dementia may be contributing factor. -Multiple rib fracture and clavicular fracture likely from fall. -Advanced dementia -Mild elevated LFTs.  Hepatitis panel negative.  CT abdomen pelvis showed no acute GI issues.  Likely transient elevation.  Recommendations --------------------------- -Called and discussed with patient's daughter.  I do not recommend any invasive testing with multiple rib fractures right now.  Offered upper GI series with small bowel follow-through but patient's daughter does not want to pursue that option at this time. -She is willing to give a trial of Remeron to boost her appetite. -I will discuss with hospitalist.  No further inpatient GI workup planned.  GI will sign off.  Call us back if needed.    LOS: 4 days   Otis Brace  MD, FACP 03/19/2022, 12:59 PM  Contact #  [phone]

## 2022-03-20 ENCOUNTER — Inpatient Hospital Stay (HOSPITAL_COMMUNITY): Payer: Medicare Other

## 2022-03-20 DIAGNOSIS — W19XXXA Unspecified fall, initial encounter: Secondary | ICD-10-CM | POA: Diagnosis not present

## 2022-03-20 DIAGNOSIS — F0390 Unspecified dementia without behavioral disturbance: Secondary | ICD-10-CM | POA: Diagnosis not present

## 2022-03-20 DIAGNOSIS — R627 Adult failure to thrive: Secondary | ICD-10-CM | POA: Diagnosis not present

## 2022-03-20 DIAGNOSIS — Y92009 Unspecified place in unspecified non-institutional (private) residence as the place of occurrence of the external cause: Secondary | ICD-10-CM

## 2022-03-20 DIAGNOSIS — E876 Hypokalemia: Secondary | ICD-10-CM

## 2022-03-20 LAB — BASIC METABOLIC PANEL
Anion gap: 13 (ref 5–15)
BUN: 22 mg/dL (ref 8–23)
CO2: 28 mmol/L (ref 22–32)
Calcium: 9.5 mg/dL (ref 8.9–10.3)
Chloride: 96 mmol/L — ABNORMAL LOW (ref 98–111)
Creatinine, Ser: 0.94 mg/dL (ref 0.44–1.00)
GFR, Estimated: 57 mL/min — ABNORMAL LOW (ref >60)
Glucose, Bld: 125 mg/dL — ABNORMAL HIGH (ref 70–99)
Potassium: 3.8 mmol/L (ref 3.5–5.1)
Sodium: 137 mmol/L (ref 135–145)

## 2022-03-20 MED ORDER — FUROSEMIDE 20 MG PO TABS: 20.0000 mg | ORAL_TABLET | Freq: Every day | ORAL | Status: AC | PRN

## 2022-03-20 MED ORDER — METHOCARBAMOL 500 MG PO TABS: 500.0000 mg | ORAL_TABLET | Freq: Three times a day (TID) | ORAL | Status: AC | PRN

## 2022-03-20 MED ORDER — GABAPENTIN 300 MG PO CAPS: 300.0000 mg | ORAL_CAPSULE | Freq: Every day | ORAL | Status: DC

## 2022-03-20 MED ORDER — HYDROCODONE-ACETAMINOPHEN 5-325 MG PO TABS: 1.0000 | ORAL_TABLET | ORAL | 0 refills | Status: AC | PRN

## 2022-03-20 MED ORDER — FLUTICASONE PROPIONATE 50 MCG/ACT NA SUSP: 1.0000 | Freq: Every day | NASAL | 2 refills | Status: AC

## 2022-03-20 MED ORDER — POLYETHYLENE GLYCOL 3350 17 G PO PACK: 17.0000 g | PACK | Freq: Every day | ORAL | 0 refills | Status: AC

## 2022-03-20 MED ORDER — ENSURE ENLIVE PO LIQD: 237.0000 mL | Freq: Two times a day (BID) | ORAL | 12 refills | Status: AC

## 2022-03-20 MED ORDER — MELATONIN 3 MG PO TABS: 3.0000 mg | ORAL_TABLET | Freq: Every day | ORAL | 0 refills | Status: AC

## 2022-03-20 MED ORDER — TRAZODONE HCL 50 MG PO TABS: 50.0000 mg | ORAL_TABLET | Freq: Every day | ORAL | Status: DC

## 2022-03-20 MED ORDER — POTASSIUM CHLORIDE CRYS ER 20 MEQ PO TBCR: 20.0000 meq | EXTENDED_RELEASE_TABLET | Freq: Every day | ORAL | 3 refills | Status: AC

## 2022-03-20 MED ORDER — SENNOSIDES-DOCUSATE SODIUM 8.6-50 MG PO TABS: 2.0000 | ORAL_TABLET | Freq: Every day | ORAL | Status: AC

## 2022-03-20 MED ORDER — LIDOCAINE 5 % EX PTCH: 1.0000 | MEDICATED_PATCH | CUTANEOUS | 0 refills | Status: AC

## 2022-03-20 MED ORDER — DULOXETINE HCL 20 MG PO CPEP
40.0000 mg | ORAL_CAPSULE | Freq: Every day | ORAL | Status: DC
  Administered 2022-03-20: 40 mg via ORAL
  Filled 2022-03-20: qty 2

## 2022-03-20 MED ORDER — FUROSEMIDE 20 MG PO TABS: 20.0000 mg | ORAL_TABLET | Freq: Every day | ORAL | Status: DC | PRN

## 2022-03-20 MED ORDER — ACETAMINOPHEN 325 MG PO TABS: 650.0000 mg | ORAL_TABLET | ORAL | Status: AC | PRN

## 2022-03-20 MED ORDER — TRAZODONE HCL 50 MG PO TABS: 50.0000 mg | ORAL_TABLET | Freq: Every day | ORAL | Status: AC

## 2022-03-20 NOTE — TOC Transition Note (Signed)
Transition of Care [facility]) - CM/SW Discharge Note  Patient Details  Name: [name] Morton MRN: [id number] Date of Birth: 1926/11/26  [facility] CM/SW Contact:  Sherie Don, LCSW Phone Number: 03/20/2022, 12:38 PM  Clinical Narrative: Patient is medically stable to discharge to SNF today. Patient will go to room 310B and the number for report is [phone]. Discharge summary, discharge orders, and SNF transfer report faxed to facility in hub. Medical necessity form done; PTAR scheduled. Discharge packet completed. CSW notified daughter of discharge and transportation being set up. RN updated. TOC signing off.    Final next level of care: Skilled Nursing Facility Barriers to Discharge: Barriers Resolved  Patient Goals and CMS Choice CMS Medicare.gov Compare Post Acute Care list provided to:: Patient Represenative (must comment) Choice offered to / list presented to : Adult Children  Discharge Placement PASRR number recieved: 03/16/22 Patient chooses bed at: [facility] Patient to be transferred to facility by: [facility] Name of family member notified: [name] (daughter) Patient and family notified of of transfer: 03/20/22  Discharge Plan and Services Additional resources added to the After Visit Summary for   In-house Referral: Clinical Social Work Post Acute Care Choice: [facility]          DME Arranged: N/A DME Agency: NA  Social Determinants of Health ([place]) Interventions SDOH Screenings   Food Insecurity: No Food Insecurity (03/16/2022)  Housing: Low Risk  (03/16/2022)  Transportation Needs: No Transportation Needs (03/16/2022)  Utilities: Not At Risk (03/16/2022)  Depression (PHQ2-9): Low Risk  (04/10/2019)  Tobacco Use: Medium Risk (03/15/2022)   Readmission Risk Interventions     No data to display

## 2022-03-20 NOTE — Plan of Care (Signed)
  Problem: Education: ?Goal: Knowledge of General Education information will improve ?Description: Including pain rating scale, medication(s)/side effects and non-pharmacologic comfort measures ?Outcome: Progressing ?  ?Problem: Activity: ?Goal: Risk for activity intolerance will decrease ?Outcome: Progressing ?  ?Problem: Coping: ?Goal: Level of anxiety will decrease ?Outcome: Progressing ?  ?Problem: Pain Managment: ?Goal: General experience of comfort will improve ?Outcome: Progressing ?  ?Problem: Safety: ?Goal: Ability to remain free from injury will improve ?Outcome: Progressing ?  ?

## 2022-03-20 NOTE — Discharge Summary (Signed)
Discharge Summary  [name] Morton ION:[id number] DOB: [birth year]  PCP: Gayland Curry, DO  Admit date: 03/15/2022 Discharge date: 03/20/2022  Time spent: 46mns, more than 50% time spent on coordination of care.   Recommendations for Outpatient Follow-up:  F/u with  SNF provider for hospital discharge follow up, repeat cbc/bmp at follow up Recommend palliative care to follow patient at SNF   Discharge Diagnoses:  Active Hospital Problems   Diagnosis Date Noted   Fall 03/15/2022   Fatigue 07/01/2016    Priority: Medium    Anterior displaced fracture of sternal end of left clavicle 03/15/2022   Rib fractures 03/15/2022   Hypokalemia 03/15/2022   [place]) [place] 03/15/2022   D[facility] with behavioral disturbance ([facility] 024/40/1027  Diastolic dysfunction with chronic heart failure ([facility] 04/01/2014   GERD (gastroesophageal reflux disease) 05/02/2013   Dyslipidemia, goal LDL below 100    Essential hypertension     Resolved Hospital Problems  No resolved problems to display.    Discharge Condition: stable  Diet recommendation: regular diet  Filed Weights   03/16/22 0351  Weight: 57 kg    History of present illness: (  HPI: [name] Morton is a 89y.o. female with medical history significant of right breast cancer, chronic kidney disease stage III, dementia, hypertension, hyperlipidemia, GERD, who apparently lives at home alone.  Patient was brought in by daughter after worsening altered mental status.  She has been failing to thrive at home.  While in the ER she was noted to have left chest wall bruising.  Further evaluation showed left clavicular fracture with multiple left-sided rib fractures.  Patient appears to be in pain with elevated blood pressure.  Daughter did not observe for as patient lives alone.  At this point a sling has been placed.  Patient being admitted for further evaluation management and possible placement.  Daughter has reported that  patient has been having poor oral intake.  She lives mainly on pizza, Dr. PMalachi Bondsfor the most part.  Not taking a lot of fluids.   Hospital Course:  Principal Problem:   Fall Active Problems:   Fatigue   Essential hypertension   Dyslipidemia, goal LDL below 100   GERD (gastroesophageal reflux disease)   Diastolic dysfunction with chronic heart failure (HCC)   Dementia with behavioral disturbance (HCC)   Anterior displaced fracture of sternal end of left clavicle   Rib fractures   Hypokalemia   [facility] [place]   Assessment and Plan:  Principal Problem:   Fall, left anterior 3rd-5th, posterolateral 4th-6th Rib fractures Anterior displaced fracture of sternal end of left clavicle, Continue sling, pain control -At home, gait instability in the setting of dementia ( uses a Morton for the last few years), failure to thrive -Continue pain control, supportive care,  tolerating Norco 5/325 1 tab every 4 hours as needed, prn Tylenol, Robaxin oral 500 mg 3 times prn, Lidoderm patch -Titrate cautiously to avoid worsening of mental status or respiratory depression -continue  bowel regimen -PT recommended SNF -Incentive spirometry not sure if able to use due to dementia      Essential hypertension/chronic diastolic chf -BP elevated, partly due to pain and intermittent agitation -continue home meds amlodipine benazepril 10 mg-20 mg daily,Continue bisoprolol 5 mg daily -reduce lasix frequency, as she has poor oral intake and signs of dehydration, change lasix to prn for edema -further bp meds titration per snf provider    AKI (acute kidney injury) (  [place]) -Creatinine 1.09 on admission, was 0.84 on 05/22/2021 -Creatinine improved, today is 0.72,   change lasix  to prn   Mild transaminitis, transient  -CT abdomen pelvis showed prominent gallbladder with evidence of intrahepatic and extrahepatic biliary ductal dilation overall stable since 2016 -Unclear etiology, amylase lipase  improving, patient is tolerating diet, without any significant abdominal pain -CK 209 -lft coming down, [facility]  consulted per daughter's request, conservative management recommended      Hypokalemia -Likely from poor oral intake and Lasix use ,replaced and improved      Dementia with behavioral disturbance (HCC) Continue Aricept      Poor oral intake, encourage Ensure nutrition supplement,  Daughter would like to try low dose trazodone at night to help sleep, appetite and depression     Discharge Exam: BP (!) 168/70 (BP Location: Left Leg)   Pulse 77   Temp (!) 97.5 F (36.4 C) (Oral)   Resp 18   Ht 5' (1.524 m)   Wt 57 kg   SpO2 94%   BMI 24.54 kg/m   General: demented elderly, only oriented to person, hard of hearing  Cardiovascular: RRR Respiratory: normal respiratory effort     Discharge Instructions     Diet general   Complete by: As directed    Increase activity slowly   Complete by: As directed       Allergies as of 03/20/2022       Reactions   Benadryl [diphenhydramine Hcl (sleep)]    Makes me hyper    Cardizem [diltiazem Hcl]    Doesn't remember. Dr Rex Morton told patient not to take.    Ceftin [cefuroxime Axetil] Other (See Comments)   Stomach Issues    Epinephrine Other (See Comments)   unknown   Oxycodone Hcl    Loss of appetite: sores on tongue and mouth   Tetanus Toxoids Other (See Comments)   Unknown    Tramadol Hcl    Loss of appetite: dry mouth/sores on tongue and mouth   Vistaril [hydroxyzine] Other (See Comments)   unknown   Vytorin [ezetimibe-simvastatin]    Not comfortable taking it. Doesn't remember reaction.         Medication List     STOP taking these medications    acetaminophen 650 MG CR tablet Commonly known as: TYLENOL Replaced by: acetaminophen 325 MG tablet   cetirizine 10 MG tablet Commonly known as: ZYRTEC   DULoxetine HCl 40 MG Cpep       TAKE these medications    acetaminophen 325 MG  tablet Commonly known as: TYLENOL Take 2 tablets (650 mg total) by mouth every 4 (four) hours as needed for fever, headache or mild pain. Replaces: acetaminophen 650 MG CR tablet   amLODipine-benazepril 10-20 MG capsule Commonly known as: LOTREL TAKE 1 CAPSULE BY MOUTH DAILY. What changed: Another medication with the same name was removed. Continue taking this medication, and follow the directions you see here.   bisoprolol 5 MG tablet Commonly known as: ZEBETA TAKE 1 TABLET DAILY   donepezil 5 MG tablet Commonly known as: ARICEPT TAKE 1 TABLET AT BEDTIME FOR MEMORY LOSS What changed: See the new instructions.   feeding supplement Liqd Take 237 mLs by mouth 2 (two) times daily between meals.   fluticasone 50 MCG/ACT nasal spray Commonly known as: FLONASE Place 1 spray into both nostrils daily.   furosemide 20 MG tablet Commonly known as: LASIX Take 1 tablet (20 mg total) by mouth daily as needed for  edema (or weight gain > 3lbs over three days). What changed:  medication strength how much to take how to take this when to take this reasons to take this additional instructions   gabapentin 300 MG capsule Commonly known as: NEURONTIN Take 1 capsule (300 mg total) by mouth at bedtime.   HYDROcodone-acetaminophen 5-325 MG tablet Commonly known as: NORCO/VICODIN Take 1 tablet by mouth every 4 (four) hours as needed for moderate pain.   lidocaine 5 % Commonly known as: LIDODERM Place 1 patch onto the skin daily. Remove & Discard patch within 12 hours or as directed by MD  To left chest and clavical   melatonin 3 MG Tabs tablet Take 1 tablet (3 mg total) by mouth at bedtime.   methocarbamol 500 MG tablet Commonly known as: ROBAXIN Take 1 tablet (500 mg total) by mouth every 8 (eight) hours as needed for muscle spasms.   polyethylene glycol 17 g packet Commonly known as: MIRALAX / GLYCOLAX Take 17 g by mouth daily. Hold if diarrhea Start taking on: March 21, 2022   potassium chloride SA 20 MEQ tablet Commonly known as: KLOR-CON M Take 1 tablet (20 mEq total) by mouth daily. Only when takes Lasix What changed:  when to take this reasons to take this   senna-docusate 8.6-50 MG tablet Commonly known as: Senokot-S Take 2 tablets by mouth at bedtime for 7 days. Hold if diarrhea   traZODone 50 MG tablet Commonly known as: DESYREL Take 1 tablet (50 mg total) by mouth at bedtime.       Allergies  Allergen Reactions   Benadryl [Diphenhydramine Hcl (Sleep)]     Makes me hyper    Cardizem [Diltiazem Hcl]     Doesn't remember. Dr Rex Morton told patient not to take.    Ceftin [Cefuroxime Axetil] Other (See Comments)    Stomach Issues    Epinephrine Other (See Comments)    unknown   Oxycodone Hcl     Loss of appetite: sores on tongue and mouth   Tetanus Toxoids Other (See Comments)    Unknown    Tramadol Hcl     Loss of appetite: dry mouth/sores on tongue and mouth   Vistaril [Hydroxyzine] Other (See Comments)    unknown   Vytorin [Ezetimibe-Simvastatin]     Not comfortable taking it. Doesn't remember reaction.     Contact information for after-discharge care     Destination     [facility] .   Service: Skilled Nursing Contact information: [address] [place] [phone]                      The results of significant diagnostics from this hospitalization (including imaging, microbiology, ancillary and laboratory) are listed below for reference.    Significant Diagnostic Studies: CT CHEST ABDOMEN PELVIS W CONTRAST  Result Date: 03/15/2022 CLINICAL DATA:  Increased lethargy and recent fall, initial encounter EXAM: CT CHEST, ABDOMEN, AND PELVIS WITH CONTRAST TECHNIQUE: Multidetector CT imaging of the chest, abdomen and pelvis was performed following the standard protocol during bolus administration of intravenous contrast. RADIATION DOSE REDUCTION:  This exam was performed according to the departmental dose-optimization program which includes automated exposure control, adjustment of the mA and/or kV according to patient size and/or use of iterative reconstruction technique. CONTRAST:  190m OMNIPAQUE IOHEXOL 300 MG/ML  SOLN COMPARISON:  Chest x-ray from earlier in the same day, CT from 03/31/2014 FINDINGS: CT CHEST FINDINGS Cardiovascular:  Atherosclerotic calcifications are noted. No aneurysmal dilatation or dissection is seen. Coronary calcifications are noted. Pulmonary artery is within normal limits with no large central pulmonary embolus. Timing was not performed for embolus evaluation. Heart is at the upper limits of normal in size. Mediastinum/Nodes: Thoracic inlet is within normal limits. The esophagus is unremarkable as visualized. No hilar or mediastinal adenopathy is noted. Minimal pericardial fluid is noted similar to that seen on prior CT from 2016. Lungs/Pleura: Minimal scarring is noted in the bases bilaterally. No focal infiltrate or effusion is seen. No pneumothorax is noted. Musculoskeletal: Midshaft left clavicular fracture is noted similar to that seen on prior plain film examination. Mild degenerative changes of the thoracic spine are noted. No compression deformity is seen. There are left-sided rib fractures identified involving the anterior third through fifth ribs as well as the posterolateral fourth through sixth ribs. No complicating factors are noted. CT ABDOMEN PELVIS FINDINGS Hepatobiliary: Gallbladder is well distended. The common bile duct and intrahepatic biliary tree are prominent. The distal common bile duct measures 12 mm with some distal tapering identified. Mild fullness of the proximal pancreatic duct is noted as well. These changes are stable from the prior CT of 2016 and have been evaluated on prior MRI without evidence of common bile duct stone identified. No new stone is identified. Pancreas: Lipoma is noted in the  pancreatic head stable from the prior exam. The peripheral pancreatic duct is within normal limits. Spleen: Normal in size without focal abnormality. Adrenals/Urinary Tract: Adrenal glands are within normal limits. Kidneys demonstrate a normal enhancement pattern bilaterally. No renal calculi or obstructive changes are noted. There is a mildly hyperdense cyst identified within the left kidney measuring 2.5 cm in greatest dimension. It shows no significant enhancement on delayed images and is increased in size from a subcentimeter cyst in 2016. No obstructive changes are seen. The bladder is well distended. Stomach/Bowel: Scattered diverticular changes noted within the colon. No findings to suggest diverticulitis are seen. The appendix has been surgically. Small bowel stomach are within normal limits. Vascular/Lymphatic: Aortic atherosclerosis. No enlarged abdominal or pelvic lymph nodes. Reproductive: Status post hysterectomy. No adnexal masses. Other: No abdominal wall hernia or abnormality. No abdominopelvic ascites. Musculoskeletal: No acute or significant osseous findings. Chronic L1 compression deformity is noted. Changes consistent with prior fracture of the left L3 transverse process with healing. IMPRESSION: CT of the chest: Multiple left-sided rib fractures as described. No pneumothorax is noted. Midshaft left clavicular fracture without complicating factors. No evidence of pulmonary embolus. CT of the abdomen and pelvis: Prominent gallbladder with evidence of intrahepatic and extrahepatic biliary ductal dilatation. The overall appearance is stable from 2016. This has been previously evaluated without evidence of distal common bile duct stone. This is likely the steady-state for the patient's given age of 75. Diverticulosis without diverticulitis. Hyperdense cyst within the left kidney measuring 2.5 cm. Nonemergent renal ultrasound could be performed to assess the nature of this lesion Electronically Signed    By: Inez Catalina M.D.   On: 03/15/2022 19:20   CT Head Wo Contrast  Result Date: 03/15/2022 CLINICAL DATA:  Fall several days ago.  Altered mental status. EXAM: CT HEAD WITHOUT CONTRAST TECHNIQUE: Contiguous axial images were obtained from the base of the skull through the vertex without intravenous contrast. RADIATION DOSE REDUCTION: This exam was performed according to the departmental dose-optimization program which includes automated exposure control, adjustment of the mA and/or kV according to patient size and/or use of iterative reconstruction technique.  COMPARISON:  05/22/2021 FINDINGS: Brain: No evidence of intracranial hemorrhage, acute infarction, hydrocephalus, extra-axial collection, or mass lesion/mass effect. Mild diffuse cerebral atrophy and moderate to severe chronic small vessel disease show no significant change in appearance. Vascular:  No hyperdense vessel or other acute findings. Skull: No evidence of fracture or other significant bone abnormality. Sinuses/Orbits:  No acute findings. Other: None. IMPRESSION: No acute intracranial abnormality. Stable cerebral atrophy and chronic small vessel disease. Electronically Signed   By: Marlaine Hind M.D.   On: 03/15/2022 16:28   DG Chest 2 View  Result Date: 03/15/2022 CLINICAL DATA:  Chest wall bruising, dementia, left shoulder pain EXAM: CHEST - 2 VIEW COMPARISON:  05/22/2021 chest radiograph. FINDINGS: Surgical clips overlie the right axilla and right upper abdomen. Stable cardiomediastinal silhouette with mild cardiomegaly. No pneumothorax. Chronic moderate elevation of the right hemidiaphragm. New small left pleural effusion. No right pleural effusion. No pulmonary edema. Chronic right lung base atelectasis. New mild compressive lateral left lung base atelectasis. Left mid clavicle shaft fracture with displacement and overriding. Lateral left fourth and fifth rib fractures. These fractures are new. IMPRESSION: 1. Left mid clavicle shaft  fracture with displacement and overriding. Lateral left fourth and fifth rib fractures. These fractures are new from 05/22/2021 chest radiograph and potentially acute. 2. No pneumothorax. 3. New small left pleural effusion. 4. Chronic moderate elevation of the right hemidiaphragm with chronic right lung base atelectasis. Electronically Signed   By: Ilona Sorrel M.D.   On: 03/15/2022 15:53   DG Shoulder Left  Result Date: 03/15/2022 CLINICAL DATA:  Dementia, chest wall bruising, shoulder pain EXAM: LEFT SHOULDER - 2+ VIEW COMPARISON:  05/22/2021 chest radiograph. FINDINGS: Left mid clavicular shaft fracture with prominent 4 cm overriding of the fracture fragments and 1.5 cm inferior displacement of the lateral fracture fragment, new. New lateral left fourth, fifth and sixth rib fractures with displaced left fifth rib fracture. No glenohumeral dislocation. No evidence of acromioclavicular separation. No suspicious focal osseous lesions. IMPRESSION: 1. Displaced and overriding left mid clavicular shaft fracture, new since 05/22/2021 chest radiograph and potentially acute. 2. Lateral left fourth, fifth and sixth rib fractures with displaced left fifth rib fracture, new since 05/22/2021 chest radiograph and potentially acute. Electronically Signed   By: Ilona Sorrel M.D.   On: 03/15/2022 15:50    Microbiology: Recent Results (from the past 240 hour(s))  Resp panel by RT-PCR (RSV, Flu A&B, Covid) Anterior Nasal Swab     Status: None   Collection Time: 03/15/22  3:14 PM   Specimen: Anterior Nasal Swab  Result Value Ref Range Status   SARS Coronavirus 2 by RT PCR NEGATIVE NEGATIVE Final    Comment: (NOTE) SARS-CoV-2 target nucleic acids are NOT DETECTED.  The SARS-CoV-2 RNA is generally detectable in upper respiratory specimens during the acute phase of infection. The lowest concentration of SARS-CoV-2 viral copies this assay can detect is 138 copies/mL. A negative result does not preclude  SARS-Cov-2 infection and should not be used as the sole basis for treatment or other patient management decisions. A negative result may occur with  improper specimen collection/handling, submission of specimen other than nasopharyngeal swab, presence of viral mutation(s) within the areas targeted by this assay, and inadequate number of viral copies(<138 copies/mL). A negative result must be combined with clinical observations, patient history, and epidemiological information. The expected result is Negative.  Fact Sheet for Patients:  EntrepreneurPulse.com.au  Fact Sheet for Healthcare Providers:  IncredibleEmployment.be  This test is no t yet  approved or cleared by the [place] and  has been authorized for detection and/or diagnosis of SARS-CoV-2 by FDA under an Emergency Use Authorization (EUA). This EUA will remain  in effect (meaning this test can be used) for the duration of the COVID-19 declaration under Section 564(b)(1) of the Act, 21 U.S.C.section 360bbb-3(b)(1), unless the authorization is terminated  or revoked sooner.       Influenza A by PCR NEGATIVE NEGATIVE Final   Influenza B by PCR NEGATIVE NEGATIVE Final    Comment: (NOTE) The Xpert Xpress SARS-CoV-2/FLU/RSV plus assay is intended as an aid in the diagnosis of influenza from Nasopharyngeal swab specimens and should not be used as a sole basis for treatment. Nasal washings and aspirates are unacceptable for Xpert Xpress SARS-CoV-2/FLU/RSV testing.  Fact Sheet for Patients: EntrepreneurPulse.com.au  Fact Sheet for Healthcare Providers: IncredibleEmployment.be  This test is not yet approved or cleared by the [place] FDA and has been authorized for detection and/or diagnosis of SARS-CoV-2 by FDA under an Emergency Use Authorization (EUA). This EUA will remain in effect (meaning this test can be used) for the duration of  the COVID-19 declaration under Section 564(b)(1) of the Act, 21 U.S.C. section 360bbb-3(b)(1), unless the authorization is terminated or revoked.     Resp Syncytial Virus by PCR NEGATIVE NEGATIVE Final    Comment: (NOTE) Fact Sheet for Patients: EntrepreneurPulse.com.au  Fact Sheet for Healthcare Providers: IncredibleEmployment.be  This test is not yet approved or cleared by the [place] FDA and has been authorized for detection and/or diagnosis of SARS-CoV-2 by FDA under an Emergency Use Authorization (EUA). This EUA will remain in effect (meaning this test can be used) for the duration of the COVID-19 declaration under Section 564(b)(1) of the Act, 21 U.S.C. section 360bbb-3(b)(1), unless the authorization is terminated or revoked.  Performed at [facility], [place] [address] [place], [place] [address]   Urine Culture     Status: None   Collection Time: 03/15/22  8:25 PM   Specimen: Urine, Random  Result Value Ref Range Status   Specimen Description   Final    URINE, RANDOM Performed at [place] [address], [place], [place] [address]    Special Requests   Final    NONE Performed at [facility], [place] [address], [place], [place] [address]    Culture   Final    NO GROWTH Performed at [facility], [place] [address] [place], [place] [address]    Report Status 03/17/2022 FINAL  Final     Labs: Basic Metabolic Panel: Recent Labs  Lab 03/16/22 0511 03/17/22 0331 03/18/22 0338 03/19/22 0313 03/20/22 0319  NA 141 138 136 134* 137  K 3.5 3.4* 3.6 3.2* 3.8  CL 103 102 96* 94* 96*  CO2 '29 27 26 28 28  '$ GLUCOSE 157* 115* 105* 120* 125*  BUN 24* '19 13 22 22  '$ CREATININE 0.85 0.78 0.72 0.88 0.94  CALCIUM 9.0 9.0 9.4 9.4 9.5  MG  --   --   --  2.2  --   PHOS  --   --   --  3.9  --    Liver Function Tests: Recent Labs  Lab 03/15/22 1513 03/16/22 0511  03/17/22 0331 03/18/22 0338 03/19/22 0313  AST 64* 53* 105* 60* 30  ALT 56* 49* 86* 90* 61*  ALKPHOS 83 73 121 141* 130*  BILITOT 1.6* 1.1 1.6* 1.4* 0.9  PROT 8.2* 6.4*  6.6 7.2 6.9  ALBUMIN 4.6 3.5 3.5 3.8 3.2*   Recent Labs  Lab 03/15/22 1513 03/16/22 1140 03/17/22 0331 03/19/22 0313  LIPASE 141* 285* 64* 46  AMYLASE  --  416* 94  --    No results for input(s): "AMMONIA" in the last 168 hours. CBC: Recent Labs  Lab 03/15/22 1513 03/16/22 0511 03/17/22 0331 03/18/22 0338 03/19/22 0313  WBC 13.2* 10.1 8.0 11.6* 9.5  NEUTROABS 10.8*  --   --   --  6.6  HGB 14.5 13.7 16.4* 14.8 14.2  HCT 42.9 41.6 49.1* 44.6 41.9  MCV 87.4 88.5 88.3 88.0 86.7  PLT 297 240 189 299 312   Cardiac Enzymes: Recent Labs  Lab 03/17/22 0623  CKTOTAL 209   BNP: BNP (last 3 results) No results for input(s): "BNP" in the last 8760 hours.  ProBNP (last 3 results) No results for input(s): "PROBNP" in the last 8760 hours.  CBG: No results for input(s): "GLUCAP" in the last 168 hours.  FURTHER DISCHARGE INSTRUCTIONS:   Get Medicines reviewed and adjusted: Please take all your medications with you for your next visit with your Primary MD   Laboratory/radiological data: Please request your Primary MD to go over all hospital tests and procedure/radiological results at the follow up, please ask your Primary MD to get all Hospital records sent to his/her office.   In some cases, they will be blood work, cultures and biopsy results pending at the time of your discharge. Please request that your primary care M.D. goes through all the records of your hospital data and follows up on these results.   Also Note the following: If you experience worsening of your admission symptoms, develop shortness of breath, life threatening emergency, suicidal or homicidal thoughts you must seek medical attention immediately by calling 911 or calling your MD immediately  if symptoms less severe.   You must read  complete instructions/literature along with all the possible adverse reactions/side effects for all the Medicines you take and that have been prescribed to you. Take any new Medicines after you have completely understood and accpet all the possible adverse reactions/side effects.    Do not drive when taking Pain medications or sleeping medications (Benzodaizepines)   Do not take more than prescribed Pain, Sleep and Anxiety Medications. It is not advisable to combine anxiety,sleep and pain medications without talking with your primary care practitioner   Special Instructions: If you have smoked or chewed Tobacco  in the last 2 yrs please stop smoking, stop any regular Alcohol  and or any Recreational drug use.   Wear Seat belts while driving.   Please note: You were cared for by a hospitalist during your hospital stay. Once you are discharged, your primary care physician will handle any further medical issues. Please note that NO REFILLS for any discharge medications will be authorized once you are discharged, as it is imperative that you return to your primary care physician (or establish a relationship with a primary care physician if you do not have one) for your post hospital discharge needs so that they can reassess your need for medications and monitor your lab values.     Signed:  Florencia Reasons MD, PhD, FACP  [facility] 03/20/2022, 11:56 AM

## 2022-03-20 NOTE — Plan of Care (Signed)
  Problem: Education: °Goal: Knowledge of General Education information will improve °Description: Including pain rating scale, medication(s)/side effects and non-pharmacologic comfort measures °Outcome: Progressing °  °Problem: Nutrition: °Goal: Adequate nutrition will be maintained °Outcome: Progressing °  °Problem: Pain Managment: °Goal: General experience of comfort will improve °Outcome: Progressing °  °Problem: Safety: °Goal: Ability to remain free from injury will improve °Outcome: Progressing °  °

## 2022-03-20 NOTE — Progress Notes (Signed)
   03/20/22 1236  Vitals  Temp (!) 97.5 F (36.4 C)  Temp Source Oral  BP (!) 183/64  MAP (mmHg) 96  BP Location Right Leg  BP Method Automatic  Pulse Rate 64  Pulse Rate Source Monitor  Resp (!) 28  MEWS COLOR  MEWS Score Color Yellow  Oxygen Therapy  SpO2 92 %  O2 Device Room Air  Pain Assessment  Faces Pain Scale 0  MEWS Score  MEWS Temp 0  MEWS Systolic 0  MEWS Pulse 0  MEWS RR 2  MEWS LOC 0  MEWS Score 2  Provider Notification  Provider Name/Title Florencia Reasons MD  Date Provider Notified 03/20/22  Time Provider Notified 1250  Method of Notification  (secure chat)  Notification Reason Change in status  Provider response See new orders  Date of Provider Response 03/20/22  Time of Provider Response 1252   Administered PRN BP medication, nausea meds. Started on  2 ltr oxygen via nasal cannula. Pt repositioned comfortably. Call bell placed within reach. Care giver at bedside.

## 2022-03-20 NOTE — Progress Notes (Addendum)
Pt alert and oriented, resting comfortably at bed  at this time. Bloodpressure has been down to 149/57, respiration 18. Pt daughter at bedside. Call bell placed within reach.   Report given to [place]. Waiting for [facility] to transport pt.

## 2022-03-30 ENCOUNTER — Non-Acute Institutional Stay: Payer: Medicare Other | Admitting: Hospice

## 2022-03-30 DIAGNOSIS — Z515 Encounter for palliative care: Secondary | ICD-10-CM

## 2022-03-30 DIAGNOSIS — R2681 Unsteadiness on feet: Secondary | ICD-10-CM

## 2022-03-30 DIAGNOSIS — K5901 Slow transit constipation: Secondary | ICD-10-CM

## 2022-03-30 DIAGNOSIS — M179 Osteoarthritis of knee, unspecified: Secondary | ICD-10-CM

## 2022-03-30 DIAGNOSIS — R52 Pain, unspecified: Secondary | ICD-10-CM

## 2022-03-30 DIAGNOSIS — F03918 Unspecified dementia, unspecified severity, with other behavioral disturbance: Secondary | ICD-10-CM

## 2022-03-30 NOTE — Progress Notes (Signed)
[facility] Consult Note Telephone: [phone]  Fax: [phone]  PATIENT NAME: [name] Morton [address] [place] [address] [phone] (home)  DOB: May 14, 1926 MRN: [id number]  PRIMARY CARE PROVIDER:    Gayland Curry, DO,  [place] [address] [phone]  REFERRING PROVIDER:   Gayland Curry, DO [place],  [place] [address] [phone]  RESPONSIBLE PARTY:    Contact Information     Name Relation Home Work Mobile   Kendrick,[name] Daughter [phone]  [phone]   Alfonse Ras   [phone]       I met face to face with patient in the facility. Visit to build trust and highlight Palliative Medicine as specialized medical care for people living with serious illness, aimed at facilitating better quality of life through symptoms relief, assisting with advance care planning and complex medical decision making. This is initial palliative consult post hospitalization.  NP called [name] and left her a voicemail with callback number. ASSESSMENT AND / RECOMMENDATIONS:   Advance Care Planning: Our advance care planning conversation included a discussion about:    The value and importance of advance care planning  Difference between Hospice and Palliative care Exploration of goals of care in the event of a sudden injury or illness  Identification and preparation of a healthcare agent  Review and updating or creation of an  advance directive document . Decision not to resuscitate or to de-escalate disease focused treatments due to poor prognosis.  CODE STATUS: Patient is a Full code.   Goals of Care: Goals include to maximize quality of life and symptom management  I spent 16 minutes providing this initial consultation. More than 50% of the time in this consultation was spent on counseling patient and coordinating  communication. --------------------------------------------------------------------------------------------------------------------------------------  Symptom Management/Plan:  Pain: acute pain in left clavicular secondary to left clavicular fracture with multiple left-sided rib fractures, for which patient was hospitalized,  treated and discharged to SNF for acute rehab. Plan of care is pain control and use of sling. Pain managed with Norco, Robaxin, Gabapentin and Lidoderm patch. Monitor for pain and respiratory depression. Titrate down as tolerated. Routine CBC CMP.  Gait instbility: PT/OT ongoing for strengthening and gait training. Constipation:Continue Senna S and Miralax.  Dementia: with behavioral disturbance. Continue Aricept.  Osteoarthritits of knee: Norco prn.  Follow up: Palliative care will continue to follow for complex medical decision making, advance care planning, and clarification of goals. Return 6 weeks or prn. Encouraged to call provider sooner with any concerns.   Family /Caregiver/Community Supports: Patient in SNF for ongoing care.   HOSPICE ELIGIBILITY/DIAGNOSIS: TBD  Chief Complaint: Initial Palliative care visit  HISTORY OF PRESENT ILLNESS:  [name] Morton is a 89 y.o. year old female  with multiple morbidities requiring close monitoring and with high risk of complications and  mortality:  with medical history significant of right breast cancer, chronic kidney disease stage III, dementia, hypertension, hyperlipidemia, GERD. Patient endorses occasional pain but currently no pain unless when she moves her left should,  already medicated. Rest of 10 point ROS asked and negative. History obtained from review of EMR, discussion with primary team, caregiver, family and/or [name] Morton.  Review and summarization of Epic records shows history from other than patient.   Independent interpretation of tests and reviewed as needed, available labs, patient records, imaging,  studies and related documents from the EMR.   PAST MEDICAL HISTORY:  Active Ambulatory Problems  Diagnosis Date Noted   Essential hypertension    Dyslipidemia, goal LDL below 100    Arm edema - right 12/29/2012   Bilateral leg edema 12/29/2012   OA (osteoarthritis) of knee 05/01/2013   GERD (gastroesophageal reflux disease) 05/02/2013   Diverticulosis of large intestine 05/02/2013   Colitis 27/74/1287   Diastolic dysfunction with chronic heart failure ([facility]) 04/01/2014   Acute blood loss anemia 04/02/2014   Fatigue 07/01/2016   Exertional dyspnea 86/76/7209   Aortic systolic murmur on examination 07/31/2016   Exertional chest tightness 08/02/2016   Syncope and collapse 10/09/2016   Mild dementia ([facility]) 08/24/2017   Neuropathy 08/24/2017   Mild protein-calorie malnutrition ([facility]) 04/10/2019   Dementia with behavioral disturbance ([place] [facility]) 04/10/2019   Proximal muscle weakness 10/12/2019   Lymphedema of right arm 10/12/2019   Fall 03/15/2022   Anterior displaced fracture of sternal end of left clavicle 03/15/2022   Rib fractures 03/15/2022   Hypokalemia 03/15/2022   AKI (acute kidney injury) ([facility]) 03/15/2022   Resolved Ambulatory Problems    Diagnosis Date Noted   No Resolved Ambulatory Problems   Past Medical History:  Diagnosis Date   Breast cancer ([facility]) 1997   Burning sensation of feet    Chronic anxiety    Chronic kidney disease    Cognitive impairment    Diverticulosis    Dyslipidemia    Fluid retention    Gait difficulty    Hearing impairment    Heart murmur    High cholesterol    History of nuclear stress test 11/2009   History of shingles 1998   Hypertension    Insomnia    Lymphedema of arm    Mitral and aortic regurgitation    OA (osteoarthritis)    Osteopenia    Scoliosis    Shortness of breath    Sinus problem    Sleep difficulties    SOB (shortness of breath)     SOCIAL HX:  Social History   Tobacco Use   Smoking status: Former    Years:  5.00    Types: Cigarettes   Smokeless tobacco: Never  Substance Use Topics   Alcohol use: No     FAMILY HX:  Family History  Problem Relation Age of Onset   Heart attack Mother    Arthritis Mother    Dementia Mother    Heart Problems Father    Stroke Father    Heart Problems Maternal Grandmother    Heart Problems Maternal Grandfather    Asthma Maternal Grandfather    Heart attack Brother 34   Heart disease Brother        CABG   Lung cancer Sister    Emphysema Sister    Cancer Sister    Narcolepsy Child    Arthritis Child    High blood pressure Child    Alcoholism Brother    Post-traumatic stress disorder Brother    COPD Sister    Seizures Sister    Heart Problems Sister       ALLERGIES:  Allergies  Allergen Reactions   Benadryl [Diphenhydramine Hcl (Sleep)]     Makes me hyper    Cardizem [Diltiazem Hcl]     Doesn't remember. Dr Rex Kras told patient not to take.    Ceftin [Cefuroxime Axetil] Other (See Comments)    Stomach Issues    Epinephrine Other (See Comments)    unknown   Oxycodone Hcl     Loss of appetite: sores on tongue and mouth   Tetanus  Toxoids Other (See Comments)    Unknown    Tramadol Hcl     Loss of appetite: dry mouth/sores on tongue and mouth   Vistaril [Hydroxyzine] Other (See Comments)    unknown   Vytorin [Ezetimibe-Simvastatin]     Not comfortable taking it. Doesn't remember reaction.       PERTINENT MEDICATIONS:  Outpatient Encounter Medications as of 03/30/2022  Medication Sig   acetaminophen (TYLENOL) 325 MG tablet Take 2 tablets (650 mg total) by mouth every 4 (four) hours as needed for fever, headache or mild pain.   amLODipine-benazepril (LOTREL) 10-20 MG capsule TAKE 1 CAPSULE BY MOUTH DAILY.   bisoprolol (ZEBETA) 5 MG tablet TAKE 1 TABLET DAILY (Patient taking differently: Take 5 mg by mouth daily.)   donepezil (ARICEPT) 5 MG tablet TAKE 1 TABLET AT BEDTIME FOR MEMORY LOSS (Patient taking differently: Take 5 mg by mouth at  bedtime.)   feeding supplement (ENSURE ENLIVE / ENSURE PLUS) LIQD Take 237 mLs by mouth 2 (two) times daily between meals.   fluticasone (FLONASE) 50 MCG/ACT nasal spray Place 1 spray into both nostrils daily.   furosemide (LASIX) 20 MG tablet Take 1 tablet (20 mg total) by mouth daily as needed for edema (or weight gain > 3lbs over three days).   gabapentin (NEURONTIN) 300 MG capsule Take 1 capsule (300 mg total) by mouth at bedtime.   HYDROcodone-acetaminophen (NORCO/VICODIN) 5-325 MG tablet Take 1 tablet by mouth every 4 (four) hours as needed for moderate pain.   lidocaine (LIDODERM) 5 % Place 1 patch onto the skin daily. Remove & Discard patch within 12 hours or as directed by MD  To left chest and clavical   melatonin 3 MG TABS tablet Take 1 tablet (3 mg total) by mouth at bedtime.   methocarbamol (ROBAXIN) 500 MG tablet Take 1 tablet (500 mg total) by mouth every 8 (eight) hours as needed for muscle spasms.   polyethylene glycol (MIRALAX / GLYCOLAX) 17 g packet Take 17 g by mouth daily. Hold if diarrhea   potassium chloride SA (KLOR-CON M) 20 MEQ tablet Take 1 tablet (20 mEq total) by mouth daily. Only when takes Lasix   traZODone (DESYREL) 50 MG tablet Take 1 tablet (50 mg total) by mouth at bedtime.   No facility-administered encounter medications on file as of 03/30/2022.     Thank you for the opportunity to participate in the care of [name] Morton.  The palliative care team will continue to follow. Please call our office at [phone] if we can be of additional assistance.   Note: Portions of this note were generated with Lobbyist. Dictation errors may occur despite best attempts at proofreading.  Teodoro Spray, NP

## 2022-04-01 ENCOUNTER — Ambulatory Visit: Payer: Medicare Other | Admitting: Podiatry

## 2022-04-03 ENCOUNTER — Ambulatory Visit: Payer: Medicare Other | Admitting: Family

## 2022-04-09 NOTE — TOC CM/SW Note (Signed)
CSW received call from [facility] regarding the patient following up with an orthopedist since the patient came to the facility in the hub, but an orthopedist did not see the patient during her hospitalization. Facility to reach out to PCP to see if an ortho referral can be made for the patient.

## 2022-05-11 ENCOUNTER — Non-Acute Institutional Stay: Payer: Medicare Other | Admitting: Hospice

## 2022-05-11 DIAGNOSIS — Z515 Encounter for palliative care: Secondary | ICD-10-CM

## 2022-05-11 DIAGNOSIS — G4700 Insomnia, unspecified: Secondary | ICD-10-CM

## 2022-05-11 DIAGNOSIS — R2681 Unsteadiness on feet: Secondary | ICD-10-CM

## 2022-05-11 DIAGNOSIS — F03918 Unspecified dementia, unspecified severity, with other behavioral disturbance: Secondary | ICD-10-CM

## 2022-05-11 DIAGNOSIS — R52 Pain, unspecified: Secondary | ICD-10-CM

## 2022-05-11 DIAGNOSIS — K5901 Slow transit constipation: Secondary | ICD-10-CM

## 2022-05-11 NOTE — Progress Notes (Signed)
[facility] Consult Note Telephone: [phone]  Fax: [phone]  PATIENT NAME: [name] Morton [address] [place] [address] [phone] (home)  DOB: April 24, 1926 MRN: [id number]  PRIMARY CARE PROVIDER:    Gayland Curry, DO,  [place] [address] [phone]  REFERRING PROVIDER:   Gayland Curry, DO [place],  [place] [address] [id number]  RESPONSIBLE PARTY:    Contact Information     Name Relation Home Work Mobile   Kendrick,[name] Daughter [phone]  [phone]   Alfonse Ras   [phone]       I met face to face with patient in the facility. Visit to build trust and highlight Palliative Medicine as specialized medical care for people living with serious illness, aimed at facilitating better quality of life through symptoms relief, assisting with advance care planning and complex medical decision making. This is a follow up visit.  ASSESSMENT AND / RECOMMENDATIONS:   CODE STATUS: Patient is a DNR  Goals of Care: Goals include to maximize quality of life and symptom management  Symptom Management/Plan:  Pain: Improved.  No longer on sling for left clavicle left fracture.  Pain managed with Norco, Robaxin, Gabapentin and Lidoderm patch. Monitor for pain and respiratory depression. Titrate down as tolerated. Routine CBC CMP.  Gait instbility: PT/OT ongoing for strengthening and gait training and mobility. Able to ambulate >200 feet with Morton walker per PT.  Constipation:Continue Senna S and Miralax.   Dementia: Progressive memory loss/confusion, incontinent of bowel and bladder  FAST 6D.  Continue Aricept.  Insomnia: Continue trazodone as ordered.  Maintain sleep hygiene. Follow up: Palliative care will continue to follow for complex medical decision making, advance care planning, and clarification of goals. Return 6 weeks or prn. Encouraged to call provider  sooner with any concerns.   Family /Caregiver/Community Supports: Patient in SNF for ongoing care.   HOSPICE ELIGIBILITY/DIAGNOSIS: TBD  Chief Complaint: Palliative f/u visit  HISTORY OF PRESENT ILLNESS:  [name] Morton is a 89 y.o. year old female  with multiple morbidities requiring close monitoring and with high risk of complications and  mortality:  with medical history significant of right breast cancer, chronic kidney disease stage III, dementia, hypertension, hyperlipidemia, GERD.  History of left clavicle left fracture, multiple fractures of ribs left side.  Patient denies pain/discomfort, FLACC 0.  Rest of 10 point ROS asked and negative. History obtained from review of EMR, discussion with primary team, caregiver, family and/or [name] Morton.  Review and summarization of Epic records shows history from other than patient.   Independent interpretation of tests and reviewed as needed, available labs, patient records, imaging, studies and related documents from the EMR.   PAST MEDICAL HISTORY:  Active Ambulatory Problems    Diagnosis Date Noted   Essential hypertension    Dyslipidemia, goal LDL below 100    Arm edema - right 12/29/2012   Bilateral leg edema 12/29/2012   OA (osteoarthritis) of knee 05/01/2013   GERD (gastroesophageal reflux disease) 05/02/2013   Diverticulosis of large intestine 05/02/2013   Colitis [id number]   Diastolic dysfunction with chronic heart failure ([facility]) 04/01/2014   Acute blood loss anemia 04/02/2014   Fatigue 07/01/2016   Exertional dyspnea [id number]   Aortic systolic murmur on examination 07/31/2016   Exertional chest tightness 08/02/2016   Syncope and collapse 10/09/2016   Mild dementia ([facility]) 08/24/2017   Neuropathy 08/24/2017   Mild protein-calorie malnutrition ([facility]) 04/10/2019   Dementia with  behavioral disturbance ([facility]) 04/10/2019   Proximal muscle weakness 10/12/2019   Lymphedema of right arm 10/12/2019   Fall 03/15/2022    Anterior displaced fracture of sternal end of left clavicle 03/15/2022   Rib fractures 03/15/2022   Hypokalemia 03/15/2022   AKI (acute kidney injury) ([place]) 03/15/2022   Resolved Ambulatory Problems    Diagnosis Date Noted   No Resolved Ambulatory Problems   Past Medical History:  Diagnosis Date   Breast cancer ([facility]) 1997   Burning sensation of feet    Chronic anxiety    Chronic kidney disease    Cognitive impairment    Diverticulosis    Dyslipidemia    Fluid retention    Gait difficulty    Hearing impairment    Heart murmur    High cholesterol    History of nuclear stress test 11/2009   History of shingles 1998   Hypertension    Insomnia    Lymphedema of arm    Mitral and aortic regurgitation    OA (osteoarthritis)    Osteopenia    Scoliosis    Shortness of breath    Sinus problem    Sleep difficulties    SOB (shortness of breath)     SOCIAL HX:  Social History   Tobacco Use   Smoking status: Former    Years: 5.00    Types: Cigarettes   Smokeless tobacco: Never  Substance Use Topics   Alcohol use: No     FAMILY HX:  Family History  Problem Relation Age of Onset   Heart attack Mother    Arthritis Mother    Dementia Mother    Heart Problems Father    Stroke Father    Heart Problems Maternal Grandmother    Heart Problems Maternal Grandfather    Asthma Maternal Grandfather    Heart attack Brother 21   Heart disease Brother        CABG   Lung cancer Sister    Emphysema Sister    Cancer Sister    Narcolepsy Child    Arthritis Child    High blood pressure Child    Alcoholism Brother    Post-traumatic stress disorder Brother    COPD Sister    Seizures Sister    Heart Problems Sister       ALLERGIES:  Allergies  Allergen Reactions   Benadryl [Diphenhydramine Hcl (Sleep)]     Makes me hyper    Cardizem [Diltiazem Hcl]     Doesn't remember. Dr Rex Kras told patient not to take.    Ceftin [Cefuroxime Axetil] Other (See Comments)    Stomach  Issues    Epinephrine Other (See Comments)    unknown   Oxycodone Hcl     Loss of appetite: sores on tongue and mouth   Tetanus Toxoids Other (See Comments)    Unknown    Tramadol Hcl     Loss of appetite: dry mouth/sores on tongue and mouth   Vistaril [Hydroxyzine] Other (See Comments)    unknown   Vytorin [Ezetimibe-Simvastatin]     Not comfortable taking it. Doesn't remember reaction.       PERTINENT MEDICATIONS:  Outpatient Encounter Medications as of 05/11/2022  Medication Sig   acetaminophen (TYLENOL) 325 MG tablet Take 2 tablets (650 mg total) by mouth every 4 (four) hours as needed for fever, headache or mild pain.   amLODipine-benazepril (LOTREL) 10-20 MG capsule TAKE 1 CAPSULE BY MOUTH DAILY.   bisoprolol (ZEBETA) 5 MG tablet TAKE 1 TABLET  DAILY (Patient taking differently: Take 5 mg by mouth daily.)   donepezil (ARICEPT) 5 MG tablet TAKE 1 TABLET AT BEDTIME FOR MEMORY LOSS (Patient taking differently: Take 5 mg by mouth at bedtime.)   feeding supplement (ENSURE ENLIVE / ENSURE PLUS) LIQD Take 237 mLs by mouth 2 (two) times daily between meals.   fluticasone (FLONASE) 50 MCG/ACT nasal spray Place 1 spray into both nostrils daily.   furosemide (LASIX) 20 MG tablet Take 1 tablet (20 mg total) by mouth daily as needed for edema (or weight gain > 3lbs over three days).   gabapentin (NEURONTIN) 300 MG capsule Take 1 capsule (300 mg total) by mouth at bedtime.   HYDROcodone-acetaminophen (NORCO/VICODIN) 5-325 MG tablet Take 1 tablet by mouth every 4 (four) hours as needed for moderate pain.   lidocaine (LIDODERM) 5 % Place 1 patch onto the skin daily. Remove & Discard patch within 12 hours or as directed by MD  To left chest and clavical   melatonin 3 MG TABS tablet Take 1 tablet (3 mg total) by mouth at bedtime.   methocarbamol (ROBAXIN) 500 MG tablet Take 1 tablet (500 mg total) by mouth every 8 (eight) hours as needed for muscle spasms.   polyethylene glycol (MIRALAX / GLYCOLAX)  17 g packet Take 17 g by mouth daily. Hold if diarrhea   potassium chloride SA (KLOR-CON M) 20 MEQ tablet Take 1 tablet (20 mEq total) by mouth daily. Only when takes Lasix   traZODone (DESYREL) 50 MG tablet Take 1 tablet (50 mg total) by mouth at bedtime.   No facility-administered encounter medications on file as of 05/11/2022.   I spent 35 minutes providing this consultation; this includes time spent with patient/family, chart review and documentation. More than 50% of the time in this consultation was spent on counseling and coordinating communication.  Thank you for the opportunity to participate in the care of [name] Morton.  The palliative care team will continue to follow. Please call our office at [phone] if we can be of additional assistance.   Note: Portions of this note were generated with Lobbyist. Dictation errors may occur despite best attempts at proofreading.  Teodoro Spray, NP

## 2022-06-05 ENCOUNTER — Non-Acute Institutional Stay: Payer: Medicare Other | Admitting: Hospice

## 2022-06-05 DIAGNOSIS — K5901 Slow transit constipation: Secondary | ICD-10-CM

## 2022-06-05 DIAGNOSIS — R2681 Unsteadiness on feet: Secondary | ICD-10-CM

## 2022-06-05 DIAGNOSIS — Z515 Encounter for palliative care: Secondary | ICD-10-CM

## 2022-06-05 DIAGNOSIS — G4700 Insomnia, unspecified: Secondary | ICD-10-CM

## 2022-06-05 DIAGNOSIS — R52 Pain, unspecified: Secondary | ICD-10-CM

## 2022-06-05 DIAGNOSIS — F0390 Unspecified dementia without behavioral disturbance: Secondary | ICD-10-CM

## 2022-06-05 NOTE — Progress Notes (Signed)
Therapist, nutritional Palliative Care Consult Note Telephone: [phone]  Fax: [phone]  PATIENT NAME: [name] Morton [address] [place] [address] [phone] (home)  DOB: [birth year] MRN: [id number]  PRIMARY CARE PROVIDER:    Kermit Balo, DO,  [address] [place] [address] [phone]  REFERRING PROVIDER:   Kermit Balo, DO [address] [place],  [place] [address] [phone]  RESPONSIBLE PARTY:    Contact Information     Name Relation Home Work Mobile   Kendrick,[name] Daughter [phone]  [phone]   Omer Jack   [phone]       I met face to face with patient in the facility. Visit to build trust and highlight Palliative Medicine as specialized medical care for people living with serious illness, aimed at facilitating better quality of life through symptoms relief, assisting with advance care planning and complex medical decision making. This is a follow up visit.  ASSESSMENT AND / RECOMMENDATIONS:   CODE STATUS: Patient is a DNR  Goals of Care: Goals include to maximize quality of life and symptom management  Symptom Management/Plan:  Pain: secondary to left clavicle left fracture, weaned off arm sling.  Pain managed with Norco, Robaxin, Gabapentin and Lidoderm patch. Monitor for pain and respiratory depression. Titrate down as tolerated. Routine CBC CMP.   Gait instbility: No recent fall. PT/OT ongoing for strengthening and gait training and mobility. Able to ambulate >200 feet with rolling walker per PT.  Constipation:Continue Senna S and Miralax.   Dementia:  memory loss/confusion at baseline, incontinent of bowel and bladder  FAST 6D.  Continue Aricept.  Encourage socialization and participation in facility activities.  Insomnia: Continue trazodone as ordered.  Maintain sleep hygiene. Follow up: Palliative care will continue to follow for complex medical decision making, advance care  planning, and clarification of goals. Return 6 weeks or prn. Encouraged to call provider sooner with any concerns.   Family /Caregiver/Community Supports: Patient in SNF for ongoing care.   HOSPICE ELIGIBILITY/DIAGNOSIS: TBD  Chief Complaint: Palliative f/u visit  HISTORY OF PRESENT ILLNESS:  [name] Morton is a 89 y.o. year old female  with multiple morbidities requiring close monitoring and with high risk of complications and  mortality:  with medical history significant of right breast cancer, chronic kidney disease stage III, dementia, hypertension, hyperlipidemia, GERD.  History of left clavicle left fracture, multiple fractures of ribs left side.  Patient denies pain/discomfort, ambulatory with her rolling walker.  FLACC 0. History obtained from review of EMR, discussion with primary team, caregiver, family and/or [name].  Review and summarization of Epic records shows history from other than patient.   Independent interpretation of tests and reviewed as needed, available labs, patient records, imaging, studies and related documents from the EMR.   PAST MEDICAL HISTORY:  Active Ambulatory Problems    Diagnosis Date Noted   Essential hypertension    Dyslipidemia, goal LDL below 100    Arm edema - right 12/29/2012   Bilateral leg edema 12/29/2012   OA (osteoarthritis) of knee 05/01/2013   GERD (gastroesophageal reflux disease) 05/02/2013   Diverticulosis of large intestine 05/02/2013   Colitis 04/01/2014   Diastolic dysfunction with chronic heart failure 04/01/2014   Acute blood loss anemia 04/02/2014   Fatigue 07/01/2016   Exertional dyspnea 07/31/2016   Aortic systolic murmur on examination 07/31/2016   Exertional chest tightness 08/02/2016   Syncope and collapse 10/09/2016   Mild dementia 08/24/2017   Neuropathy 08/24/2017   Mild  protein-calorie malnutrition 04/10/2019   Dementia with behavioral disturbance 04/10/2019   Proximal muscle weakness 10/12/2019    Lymphedema of right arm 10/12/2019   Fall 03/15/2022   Anterior displaced fracture of sternal end of left clavicle 03/15/2022   Rib fractures 03/15/2022   Hypokalemia 03/15/2022   AKI (acute kidney injury) 03/15/2022   Resolved Ambulatory Problems    Diagnosis Date Noted   No Resolved Ambulatory Problems   Past Medical History:  Diagnosis Date   Breast cancer (HCC) 1997   Burning sensation of feet    Chronic anxiety    Chronic kidney disease    Cognitive impairment    Diverticulosis    Dyslipidemia    Fluid retention    Gait difficulty    Hearing impairment    Heart murmur    High cholesterol    History of nuclear stress test 11/2009   History of shingles 1998   Hypertension    Insomnia    Lymphedema of arm    Mitral and aortic regurgitation    OA (osteoarthritis)    Osteopenia    Scoliosis    Shortness of breath    Sinus problem    Sleep difficulties    SOB (shortness of breath)     SOCIAL HX:  Social History   Tobacco Use   Smoking status: Former    Years: 5    Types: Cigarettes   Smokeless tobacco: Never  Substance Use Topics   Alcohol use: No     FAMILY HX:  Family History  Problem Relation Age of Onset   Heart attack Mother    Arthritis Mother    Dementia Mother    Heart Problems Father    Stroke Father    Heart Problems Maternal Grandmother    Heart Problems Maternal Grandfather    Asthma Maternal Grandfather    Heart attack Brother [id number]   Heart disease Brother        CABG   Lung cancer Sister    Emphysema Sister    Cancer Sister    Narcolepsy Child    Arthritis Child    High blood pressure Child    Alcoholism Brother    Post-traumatic stress disorder Brother    COPD Sister    Seizures Sister    Heart Problems Sister       ALLERGIES:  Allergies  Allergen Reactions   Benadryl [Diphenhydramine Hcl (Sleep)]     Makes me hyper    Cardizem [Diltiazem Hcl]     Doesn't remember. Dr Clarene DukeLittle told patient not to take.    Ceftin  [Cefuroxime Axetil] Other (See Comments)    Stomach Issues    Epinephrine Other (See Comments)    unknown   Oxycodone Hcl     Loss of appetite: sores on tongue and mouth   Tetanus Toxoids Other (See Comments)    Unknown    Tramadol Hcl     Loss of appetite: dry mouth/sores on tongue and mouth   Vistaril [Hydroxyzine] Other (See Comments)    unknown   Vytorin [Ezetimibe-Simvastatin]     Not comfortable taking it. Doesn't remember reaction.       PERTINENT MEDICATIONS:  Outpatient Encounter Medications as of 06/05/2022  Medication Sig   acetaminophen (TYLENOL) 325 MG tablet Take 2 tablets (650 mg total) by mouth every 4 (four) hours as needed for fever, headache or mild pain.   amLODipine-benazepril (LOTREL) 10-20 MG capsule TAKE 1 CAPSULE BY MOUTH DAILY.   bisoprolol (ZEBETA) 5  MG tablet TAKE 1 TABLET DAILY (Patient taking differently: Take 5 mg by mouth daily.)   donepezil (ARICEPT) 5 MG tablet TAKE 1 TABLET AT BEDTIME FOR MEMORY LOSS (Patient taking differently: Take 5 mg by mouth at bedtime.)   feeding supplement (ENSURE ENLIVE / ENSURE PLUS) LIQD Take 237 mLs by mouth 2 (two) times daily between meals.   fluticasone (FLONASE) 50 MCG/ACT nasal spray Place 1 spray into both nostrils daily.   furosemide (LASIX) 20 MG tablet Take 1 tablet (20 mg total) by mouth daily as needed for edema (or weight gain > 3lbs over three days).   gabapentin (NEURONTIN) 300 MG capsule Take 1 capsule (300 mg total) by mouth at bedtime.   HYDROcodone-acetaminophen (NORCO/VICODIN) 5-325 MG tablet Take 1 tablet by mouth every 4 (four) hours as needed for moderate pain.   lidocaine (LIDODERM) 5 % Place 1 patch onto the skin daily. Remove & Discard patch within 12 hours or as directed by MD  To left chest and clavical   melatonin 3 MG TABS tablet Take 1 tablet (3 mg total) by mouth at bedtime.   methocarbamol (ROBAXIN) 500 MG tablet Take 1 tablet (500 mg total) by mouth every 8 (eight) hours as needed for muscle  spasms.   polyethylene glycol (MIRALAX / GLYCOLAX) 17 g packet Take 17 g by mouth daily. Hold if diarrhea   potassium chloride SA (KLOR-CON M) 20 MEQ tablet Take 1 tablet (20 mEq total) by mouth daily. Only when takes Lasix   traZODone (DESYREL) 50 MG tablet Take 1 tablet (50 mg total) by mouth at bedtime.   No facility-administered encounter medications on file as of 06/05/2022.   I spent 35 minutes providing this consultation; this includes time spent with patient/family, chart review and documentation. More than 50% of the time in this consultation was spent on counseling and coordinating communication.  Thank you for the opportunity to participate in the care of [name].  The palliative care team will continue to follow. Please call our office at [phone] if we can be of additional assistance.   Note: Portions of this note were generated with Scientist, clinical (histocompatibility and immunogenetics)Dragon dictation software. Dictation errors may occur despite best attempts at proofreading.  Rosaura CarpenterLivina I Taten Merrow, NP

## 2022-06-19 ENCOUNTER — Other Ambulatory Visit (HOSPITAL_BASED_OUTPATIENT_CLINIC_OR_DEPARTMENT_OTHER): Payer: Self-pay

## 2022-06-19 MED ORDER — COMIRNATY 30 MCG/0.3ML IM SUSY
0.3000 mL | PREFILLED_SYRINGE | INTRAMUSCULAR | 0 refills | Status: AC
  Filled 2022-06-19: qty 0.3, 1d supply, fill #0

## 2022-07-01 ENCOUNTER — Non-Acute Institutional Stay: Payer: Medicare Other | Admitting: Hospice

## 2022-07-01 DIAGNOSIS — R52 Pain, unspecified: Secondary | ICD-10-CM

## 2022-07-01 DIAGNOSIS — K5901 Slow transit constipation: Secondary | ICD-10-CM

## 2022-07-01 DIAGNOSIS — R2681 Unsteadiness on feet: Secondary | ICD-10-CM

## 2022-07-01 DIAGNOSIS — G4700 Insomnia, unspecified: Secondary | ICD-10-CM

## 2022-07-01 DIAGNOSIS — Z515 Encounter for palliative care: Secondary | ICD-10-CM

## 2022-07-01 DIAGNOSIS — F0390 Unspecified dementia without behavioral disturbance: Secondary | ICD-10-CM

## 2022-07-01 NOTE — Progress Notes (Signed)
Therapist, nutritional Palliative Care Consult Note Telephone: [phone]  Fax: [phone]  PATIENT NAME: [name] Morton [address] [place] [address] [phone] (home)  DOB: [birth year] MRN: [id number]  PRIMARY CARE PROVIDER:    Kermit Balo, DO,  [address] [place] [address] [phone]  REFERRING PROVIDER:   Kermit Balo, DO [address] [place],  [place] [address] [phone]  RESPONSIBLE PARTY:    Contact Information     Name Relation Home Work Mobile   Kendrick,[name] Daughter [phone]  [phone]   Omer Jack   [phone]       I met face to face with patient in the facility. Visit to build trust and highlight Palliative Medicine as specialized medical care for people living with serious illness, aimed at facilitating better quality of life through symptoms relief, assisting with advance care planning and complex medical decision making. This is a follow up visit. Patient seen for further decline in cognitive function.   Visit consisted of counseling and education dealing with the complex and emotionally intense issues of symptom management and palliative care in the setting of serious and potentially life-threatening illness. Palliative care team will continue to support patient, patient's family, and medical team.  ASSESSMENT AND / RECOMMENDATIONS:   CODE STATUS: Patient is a DNR  Goals of Care: Goals include to maximize quality of life and symptom management  Symptom Management/Plan: Dementia:  progressive memory loss/confusion, incontinent of bowel and bladder, more difficulty finding words,  showing gradual progression of Dementia disease.  Continue Aricept.  Encourage socialization and participation in facility activities-brain teasers, bingo, coffee cart. Continue to monitor for decline in functional status, monitor for personlaity changes, fall/safety.   Pain: Improved.  Continue Norco,  Robaxin, Gabapentin and Lidoderm patch.left shoulder pain is secondary to left clavicle left fracture, weaned off arm sling.   Monitor for pain and respiratory depression.  Consider to gradually wean off narcotic weighing benefit versus risk of decompensation.  Routine CBC CMP.   Gait instbility: No recent fall. PT/OT ongoing for strengthening and gait training and mobility. Able to ambulate >200 feet with rolling walker per PT.  Constipation:Continue Senna S and Miralax.   Insomnia: Continue trazodone as ordered.  Maintain sleep hygiene.  Follow up: Palliative care will continue to follow for complex medical decision making, advance care planning, and clarification of goals. Return 6 weeks or prn. Encouraged to call provider sooner with any concerns.   Family /Caregiver/Community Supports: Patient in SNF for ongoing care.   HOSPICE ELIGIBILITY/DIAGNOSIS: TBD  Chief Complaint: Palliative f/u visit  HISTORY OF PRESENT ILLNESS:  [name] Morton is a 89 y.o. year old female  with multiple morbidities requiring close monitoring and with high risk of complications and  mortality:  with medical history significant of right breast cancer, chronic kidney disease stage III, dementia, hypertension, hyperlipidemia, GERD.  History of left clavicle left fracture, multiple fractures of ribs left side.  Patient denies pain/discomfort, in no acute distress, ambulatory with her rolling walker.  FLACC 0. History obtained from review of EMR, discussion with primary team, caregiver, family and/or [name]. Nurse Ottis Stain reports patient has declined further in her memory loss/confusion, still pleasant and cooperative.  Attempts to reach daughter unsuccessful.  Review and summarization of Epic records shows history from other than patient.   Independent interpretation of tests and reviewed as needed, available labs, patient records, imaging, studies and related documents from the EMR.   PAST MEDICAL  HISTORY:  Active Ambulatory Problems    Diagnosis Date Noted   Essential hypertension    Dyslipidemia, goal LDL below 100    Arm edema - right 12/29/2012   Bilateral leg edema 12/29/2012   OA (osteoarthritis) of knee 05/01/2013   GERD (gastroesophageal reflux disease) 05/02/2013   Diverticulosis of large intestine 05/02/2013   Colitis 04/01/2014   Diastolic dysfunction with chronic heart failure (HCC) 04/01/2014   Acute blood loss anemia 04/02/2014   Fatigue 07/01/2016   Exertional dyspnea 07/31/2016   Aortic systolic murmur on examination 07/31/2016   Exertional chest tightness 08/02/2016   Syncope and collapse 10/09/2016   Mild dementia (HCC) 08/24/2017   Neuropathy 08/24/2017   Mild protein-calorie malnutrition (HCC) 04/10/2019   Dementia with behavioral disturbance (HCC) 04/10/2019   Proximal muscle weakness 10/12/2019   Lymphedema of right arm 10/12/2019   Fall 03/15/2022   Anterior displaced fracture of sternal end of left clavicle 03/15/2022   Rib fractures 03/15/2022   Hypokalemia 03/15/2022   AKI (acute kidney injury) (HCC) 03/15/2022   Resolved Ambulatory Problems    Diagnosis Date Noted   No Resolved Ambulatory Problems   Past Medical History:  Diagnosis Date   Breast cancer (HCC) 1997   Burning sensation of feet    Chronic anxiety    Chronic kidney disease    Cognitive impairment    Diverticulosis    Dyslipidemia    Fluid retention    Gait difficulty    Hearing impairment    Heart murmur    High cholesterol    History of nuclear stress test 11/2009   History of shingles 1998   Hypertension    Insomnia    Lymphedema of arm    Mitral and aortic regurgitation    OA (osteoarthritis)    Osteopenia    Scoliosis    Shortness of breath    Sinus problem    Sleep difficulties    SOB (shortness of breath)     SOCIAL HX:  Social History   Tobacco Use   Smoking status: Former    Years: 5    Types: Cigarettes   Smokeless tobacco: Never  Substance  Use Topics   Alcohol use: No     FAMILY HX:  Family History  Problem Relation Age of Onset   Heart attack Mother    Arthritis Mother    Dementia Mother    Heart Problems Father    Stroke Father    Heart Problems Maternal Grandmother    Heart Problems Maternal Grandfather    Asthma Maternal Grandfather    Heart attack Brother 25   Heart disease Brother        CABG   Lung cancer Sister    Emphysema Sister    Cancer Sister    Narcolepsy Child    Arthritis Child    High blood pressure Child    Alcoholism Brother    Post-traumatic stress disorder Brother    COPD Sister    Seizures Sister    Heart Problems Sister       ALLERGIES:  Allergies  Allergen Reactions   Benadryl [Diphenhydramine Hcl (Sleep)]     Makes me hyper    Cardizem [Diltiazem Hcl]     Doesn't remember. Dr Clarene Duke told patient not to take.    Ceftin [Cefuroxime Axetil] Other (See Comments)    Stomach Issues    Epinephrine Other (See Comments)    unknown   Oxycodone Hcl     Loss of appetite: sores on  tongue and mouth   Tetanus Toxoids Other (See Comments)    Unknown    Tramadol Hcl     Loss of appetite: dry mouth/sores on tongue and mouth   Vistaril [Hydroxyzine] Other (See Comments)    unknown   Vytorin [Ezetimibe-Simvastatin]     Not comfortable taking it. Doesn't remember reaction.       PERTINENT MEDICATIONS:  Outpatient Encounter Medications as of 07/01/2022  Medication Sig   acetaminophen (TYLENOL) 325 MG tablet Take 2 tablets (650 mg total) by mouth every 4 (four) hours as needed for fever, headache or mild pain.   amLODipine-benazepril (LOTREL) 10-20 MG capsule TAKE 1 CAPSULE BY MOUTH DAILY.   bisoprolol (ZEBETA) 5 MG tablet TAKE 1 TABLET DAILY (Patient taking differently: Take 5 mg by mouth daily.)   COVID-19 mRNA vaccine 2023-2024 (COMIRNATY) syringe Inject 0.3 mLs into the muscle.   donepezil (ARICEPT) 5 MG tablet TAKE 1 TABLET AT BEDTIME FOR MEMORY LOSS (Patient taking differently: Take 5  mg by mouth at bedtime.)   feeding supplement (ENSURE ENLIVE / ENSURE PLUS) LIQD Take 237 mLs by mouth 2 (two) times daily between meals.   fluticasone (FLONASE) 50 MCG/ACT nasal spray Place 1 spray into both nostrils daily.   furosemide (LASIX) 20 MG tablet Take 1 tablet (20 mg total) by mouth daily as needed for edema (or weight gain > 3lbs over three days).   gabapentin (NEURONTIN) 300 MG capsule Take 1 capsule (300 mg total) by mouth at bedtime.   HYDROcodone-acetaminophen (NORCO/VICODIN) 5-325 MG tablet Take 1 tablet by mouth every 4 (four) hours as needed for moderate pain.   lidocaine (LIDODERM) 5 % Place 1 patch onto the skin daily. Remove & Discard patch within 12 hours or as directed by MD  To left chest and clavical   melatonin 3 MG TABS tablet Take 1 tablet (3 mg total) by mouth at bedtime.   methocarbamol (ROBAXIN) 500 MG tablet Take 1 tablet (500 mg total) by mouth every 8 (eight) hours as needed for muscle spasms.   polyethylene glycol (MIRALAX / GLYCOLAX) 17 g packet Take 17 g by mouth daily. Hold if diarrhea   potassium chloride SA (KLOR-CON M) 20 MEQ tablet Take 1 tablet (20 mEq total) by mouth daily. Only when takes Lasix   traZODone (DESYREL) 50 MG tablet Take 1 tablet (50 mg total) by mouth at bedtime.   No facility-administered encounter medications on file as of 07/01/2022.   I spent 35 minutes providing this consultation; this includes time spent with patient/family, chart review and documentation. More than 50% of the time in this consultation was spent on counseling and coordinating communication.  Thank you for the opportunity to participate in the care of [name].  The palliative care team will continue to follow. Please call our office at [phone] if we can be of additional assistance.   Note: Portions of this note were generated with Scientist, clinical (histocompatibility and immunogenetics). Dictation errors may occur despite best attempts at proofreading.  Rosaura Carpenter, NP

## 2022-07-15 ENCOUNTER — Non-Acute Institutional Stay: Payer: Medicare Other | Admitting: Hospice

## 2022-07-15 DIAGNOSIS — Z515 Encounter for palliative care: Secondary | ICD-10-CM

## 2022-07-15 DIAGNOSIS — R52 Pain, unspecified: Secondary | ICD-10-CM

## 2022-07-15 DIAGNOSIS — R2681 Unsteadiness on feet: Secondary | ICD-10-CM

## 2022-07-15 DIAGNOSIS — F0390 Unspecified dementia without behavioral disturbance: Secondary | ICD-10-CM

## 2022-07-15 DIAGNOSIS — K5901 Slow transit constipation: Secondary | ICD-10-CM

## 2022-07-15 NOTE — Progress Notes (Signed)
Therapist, nutritional Palliative Care Consult Note Telephone: [phone]  Fax: [phone]  PATIENT NAME: [name] Morton [address] [place] [address] [phone] (home)  DOB: [birth year] MRN: [id number]  PRIMARY CARE PROVIDER:    Kermit Balo, DO,  [address] [place] [address] [phone]  REFERRING PROVIDER:   Kermit Balo, DO [address] [place],  [place] [address] [phone]  RESPONSIBLE PARTY:    Contact Information     Name Relation Home Work Mobile   Kendrick,[name] Daughter [phone]  [phone]   Omer Jack   [phone]       I met face to face with patient in the facility. Visit to build trust and highlight Palliative Medicine as specialized medical care for people living with serious illness, aimed at facilitating better quality of life through symptoms relief, assisting with advance care planning and complex medical decision making. This is a follow up visit for or decline in cognitive function.   Visit consisted of counseling and education dealing with the complex and emotionally intense issues of symptom management and palliative care in the setting of serious and potentially life-threatening illness. Palliative care team will continue to support patient, patient's family, and medical team.  ASSESSMENT AND / RECOMMENDATIONS:   CODE STATUS: Patient is a DNR  Goals of Care: Goals include to maximize quality of life and symptom management  Symptom Management/Plan: Dementia:  progressive dementia with memory loss/confusion, incontinent of bowel and bladder, more difficulty finding words,  in line with dementia disease trajectory.  Continue Aricept.   Encourage reminiscence, socialization and participation in facility activities-brain teasers, bingo, coffee cart. Continue to monitor for decline in functional status, monitor for personlaity changes; fall/safety precautions.    Pain: Improved.   Continue Norco, Robaxin, Gabapentin and Lidoderm patch; left shoulder pain is secondary to left clavicle left fracture, weaned off arm sling.   Monitor for pain and respiratory depression.  Consider to gradually wean off narcotic weighing benefit versus risk of decompensation.  Routine CBC CMP.   Gait instbility: Ambulatory with rolling walker. No recent fall. PT/OT ongoing for strengthening and gait training and mobility. Able to ambulate >200 feet with rolling walker per PT.  Constipation:Continue Senna S and Miralax.    Follow up: Palliative care will continue to follow for complex medical decision making, advance care planning, and clarification of goals. Return 6 weeks or prn. Encouraged to call provider sooner with any concerns.   Family /Caregiver/Community Supports: Patient in SNF for ongoing care.   HOSPICE ELIGIBILITY/DIAGNOSIS: TBD  Chief Complaint: Palliative f/u visit  HISTORY OF PRESENT ILLNESS:  [name] Morton is a 89 y.o. year old female  with multiple morbidities requiring close monitoring and with high risk of complications and  mortality:  with medical history significant of right breast cancer, chronic kidney disease stage III, dementia, hypertension, hyperlipidemia, GERD.  History of left clavicle left fracture, multiple fractures of ribs left side.  Patient denies pain/discomfort, in no acute distress, ambulatory with her rolling walker.  FLACC 0. History obtained from review of EMR, discussion with primary team, caregiver, family and/or [name]. No hospitalization since last visit, nursing with no concerns today.  Review and summarization of Epic records shows history from other than patient.   Independent interpretation of tests and reviewed as needed, available labs, patient records, imaging, studies and related documents from the EMR.   PAST MEDICAL HISTORY:  Active Ambulatory Problems    Diagnosis Date Noted   Essential  hypertension    Dyslipidemia,  goal LDL below 100    Arm edema - right 12/29/2012   Bilateral leg edema 12/29/2012   OA (osteoarthritis) of knee 05/01/2013   GERD (gastroesophageal reflux disease) 05/02/2013   Diverticulosis of large intestine 05/02/2013   Colitis 04/01/2014   Diastolic dysfunction with chronic heart failure (HCC) 04/01/2014   Acute blood loss anemia 04/02/2014   Fatigue 07/01/2016   Exertional dyspnea 07/31/2016   Aortic systolic murmur on examination 07/31/2016   Exertional chest tightness 08/02/2016   Syncope and collapse 10/09/2016   Mild dementia (HCC) 08/24/2017   Neuropathy 08/24/2017   Mild protein-calorie malnutrition (HCC) 04/10/2019   Dementia with behavioral disturbance (HCC) 04/10/2019   Proximal muscle weakness 10/12/2019   Lymphedema of right arm 10/12/2019   Fall 03/15/2022   Anterior displaced fracture of sternal end of left clavicle 03/15/2022   Rib fractures 03/15/2022   Hypokalemia 03/15/2022   AKI (acute kidney injury) (HCC) 03/15/2022   Resolved Ambulatory Problems    Diagnosis Date Noted   No Resolved Ambulatory Problems   Past Medical History:  Diagnosis Date   Breast cancer (HCC) 1997   Burning sensation of feet    Chronic anxiety    Chronic kidney disease    Cognitive impairment    Diverticulosis    Dyslipidemia    Fluid retention    Gait difficulty    Hearing impairment    Heart murmur    High cholesterol    History of nuclear stress test 11/2009   History of shingles 1998   Hypertension    Insomnia    Lymphedema of arm    Mitral and aortic regurgitation    OA (osteoarthritis)    Osteopenia    Scoliosis    Shortness of breath    Sinus problem    Sleep difficulties    SOB (shortness of breath)     SOCIAL HX:  Social History   Tobacco Use   Smoking status: Former    Years: 5    Types: Cigarettes   Smokeless tobacco: Never  Substance Use Topics   Alcohol use: No     FAMILY HX:  Family History  Problem Relation Age of Onset   Heart  attack Mother    Arthritis Mother    Dementia Mother    Heart Problems Father    Stroke Father    Heart Problems Maternal Grandmother    Heart Problems Maternal Grandfather    Asthma Maternal Grandfather    Heart attack Brother 65   Heart disease Brother        CABG   Lung cancer Sister    Emphysema Sister    Cancer Sister    Narcolepsy Child    Arthritis Child    High blood pressure Child    Alcoholism Brother    Post-traumatic stress disorder Brother    COPD Sister    Seizures Sister    Heart Problems Sister       ALLERGIES:  Allergies  Allergen Reactions   Benadryl [Diphenhydramine Hcl (Sleep)]     Makes me hyper    Cardizem [Diltiazem Hcl]     Doesn't remember. Dr Clarene Duke told patient not to take.    Ceftin [Cefuroxime Axetil] Other (See Comments)    Stomach Issues    Epinephrine Other (See Comments)    unknown   Oxycodone Hcl     Loss of appetite: sores on tongue and mouth   Tetanus Toxoids Other (See Comments)  Unknown    Tramadol Hcl     Loss of appetite: dry mouth/sores on tongue and mouth   Vistaril [Hydroxyzine] Other (See Comments)    unknown   Vytorin [Ezetimibe-Simvastatin]     Not comfortable taking it. Doesn't remember reaction.       PERTINENT MEDICATIONS:  Outpatient Encounter Medications as of 07/15/2022  Medication Sig   acetaminophen (TYLENOL) 325 MG tablet Take 2 tablets (650 mg total) by mouth every 4 (four) hours as needed for fever, headache or mild pain.   amLODipine-benazepril (LOTREL) 10-20 MG capsule TAKE 1 CAPSULE BY MOUTH DAILY.   bisoprolol (ZEBETA) 5 MG tablet TAKE 1 TABLET DAILY (Patient taking differently: Take 5 mg by mouth daily.)   COVID-19 mRNA vaccine 2023-2024 (COMIRNATY) syringe Inject 0.3 mLs into the muscle.   donepezil (ARICEPT) 5 MG tablet TAKE 1 TABLET AT BEDTIME FOR MEMORY LOSS (Patient taking differently: Take 5 mg by mouth at bedtime.)   feeding supplement (ENSURE ENLIVE / ENSURE PLUS) LIQD Take 237 mLs by mouth 2  (two) times daily between meals.   fluticasone (FLONASE) 50 MCG/ACT nasal spray Place 1 spray into both nostrils daily.   furosemide (LASIX) 20 MG tablet Take 1 tablet (20 mg total) by mouth daily as needed for edema (or weight gain > 3lbs over three days).   gabapentin (NEURONTIN) 300 MG capsule Take 1 capsule (300 mg total) by mouth at bedtime.   HYDROcodone-acetaminophen (NORCO/VICODIN) 5-325 MG tablet Take 1 tablet by mouth every 4 (four) hours as needed for moderate pain.   lidocaine (LIDODERM) 5 % Place 1 patch onto the skin daily. Remove & Discard patch within 12 hours or as directed by MD  To left chest and clavical   melatonin 3 MG TABS tablet Take 1 tablet (3 mg total) by mouth at bedtime.   methocarbamol (ROBAXIN) 500 MG tablet Take 1 tablet (500 mg total) by mouth every 8 (eight) hours as needed for muscle spasms.   polyethylene glycol (MIRALAX / GLYCOLAX) 17 g packet Take 17 g by mouth daily. Hold if diarrhea   potassium chloride SA (KLOR-CON M) 20 MEQ tablet Take 1 tablet (20 mEq total) by mouth daily. Only when takes Lasix   traZODone (DESYREL) 50 MG tablet Take 1 tablet (50 mg total) by mouth at bedtime.   No facility-administered encounter medications on file as of 07/15/2022.   I spent 35 minutes providing this consultation; this includes time spent with patient/family, chart review and documentation. More than 50% of the time in this consultation was spent on counseling and coordinating communication.  Thank you for the opportunity to participate in the care of [name].  The palliative care team will continue to follow. Please call our office at [phone] if we can be of additional assistance.   Note: Portions of this note were generated with Scientist, clinical (histocompatibility and immunogenetics). Dictation errors may occur despite best attempts at proofreading.  Rosaura Carpenter, NP

## 2022-08-05 ENCOUNTER — Other Ambulatory Visit (HOSPITAL_COMMUNITY): Payer: Self-pay
